# Patient Record
Sex: Female | Born: 1970 | Race: Black or African American | Hispanic: No | Smoking: Never smoker
Health system: Southern US, Community
[De-identification: ages and names within clinical notes are randomized; demographics above are authoritative.]

## PROBLEM LIST (undated history)

## (undated) DIAGNOSIS — T451X5A Adverse effect of antineoplastic and immunosuppressive drugs, initial encounter: Secondary | ICD-10-CM

## (undated) DIAGNOSIS — I427 Cardiomyopathy due to drug and external agent: Secondary | ICD-10-CM

## (undated) DIAGNOSIS — I10 Essential (primary) hypertension: Secondary | ICD-10-CM

## (undated) DIAGNOSIS — O020 Blighted ovum and nonhydatidiform mole: Secondary | ICD-10-CM

## (undated) DIAGNOSIS — O019 Hydatidiform mole, unspecified: Secondary | ICD-10-CM

## (undated) DIAGNOSIS — C801 Malignant (primary) neoplasm, unspecified: Secondary | ICD-10-CM

## (undated) HISTORY — DX: Cardiomyopathy due to drug and external agent: T45.1X5A

## (undated) HISTORY — DX: Morbid (severe) obesity due to excess calories: E66.01

## (undated) HISTORY — PX: DILATION AND CURETTAGE OF UTERUS: SHX78

## (undated) HISTORY — DX: Blighted ovum and nonhydatidiform mole: O02.0

## (undated) HISTORY — DX: Malignant (primary) neoplasm, unspecified: C80.1

## (undated) HISTORY — DX: Cardiomyopathy due to drug and external agent: I42.7

## (undated) HISTORY — PX: OTHER SURGICAL HISTORY: SHX169

## (undated) HISTORY — DX: Hydatidiform mole, unspecified: O01.9

## (undated) HISTORY — DX: Essential (primary) hypertension: I10

---

## 2005-10-16 ENCOUNTER — Other Ambulatory Visit: Admission: RE | Admit: 2005-10-16 | Discharge: 2005-10-16 | Payer: Self-pay | Admitting: Family Medicine

## 2005-10-16 ENCOUNTER — Ambulatory Visit: Payer: Self-pay | Admitting: Family Medicine

## 2005-10-21 ENCOUNTER — Ambulatory Visit: Payer: Self-pay | Admitting: Family Medicine

## 2005-10-31 ENCOUNTER — Ambulatory Visit: Payer: Self-pay | Admitting: Family Medicine

## 2005-12-25 ENCOUNTER — Ambulatory Visit: Payer: Self-pay | Admitting: Internal Medicine

## 2006-10-13 ENCOUNTER — Other Ambulatory Visit: Admission: RE | Admit: 2006-10-13 | Discharge: 2006-10-13 | Payer: Self-pay | Admitting: Family Medicine

## 2006-10-13 ENCOUNTER — Ambulatory Visit: Payer: Self-pay | Admitting: Family Medicine

## 2006-10-13 ENCOUNTER — Encounter: Payer: Self-pay | Admitting: Family Medicine

## 2006-10-15 ENCOUNTER — Ambulatory Visit: Payer: Self-pay | Admitting: Family Medicine

## 2006-10-15 LAB — CONVERTED CEMR LAB
Albumin: 3.3 g/dL — ABNORMAL LOW (ref 3.5–5.2)
Basophils Absolute: 0 10*3/uL (ref 0.0–0.1)
CO2: 24 meq/L (ref 19–32)
Chloride: 104 meq/L (ref 96–112)
Chol/HDL Ratio, serum: 2.9
Creatinine, Ser: 0.9 mg/dL (ref 0.4–1.2)
Eosinophil percent: 1.6 % (ref 0.0–5.0)
GFR calc non Af Amer: 75 mL/min
Glucose, Bld: 95 mg/dL (ref 70–99)
HCT: 37.1 % (ref 36.0–46.0)
HDL: 47 mg/dL (ref >39.0)
LDL Cholesterol: 76 mg/dL (ref 0–99)
MCHC: 31.1 g/dL (ref 30.0–36.0)
MCV: 73 fL — ABNORMAL LOW (ref 78.0–100.0)
Monocytes Absolute: 0.6 10*3/uL (ref 0.2–0.7)
Monocytes Relative: 10.9 % (ref 3.0–11.0)
Neutro Abs: 3 10*3/uL (ref 1.4–7.7)
Platelets: 211 10*3/uL (ref 150–400)
RBC: 5.08 M/uL (ref 3.87–5.11)
Sodium: 135 meq/L (ref 135–145)
Triglyceride fasting, serum: 61 mg/dL (ref 0–149)
VLDL: 12 mg/dL (ref 0–40)

## 2006-12-17 ENCOUNTER — Ambulatory Visit: Payer: Self-pay | Admitting: Family Medicine

## 2006-12-22 ENCOUNTER — Encounter: Admission: RE | Admit: 2006-12-22 | Discharge: 2007-03-22 | Payer: Self-pay | Admitting: Family Medicine

## 2007-01-05 ENCOUNTER — Ambulatory Visit: Payer: Self-pay | Admitting: Family Medicine

## 2007-11-10 ENCOUNTER — Ambulatory Visit: Payer: Self-pay | Admitting: Family Medicine

## 2007-11-10 DIAGNOSIS — N760 Acute vaginitis: Secondary | ICD-10-CM | POA: Insufficient documentation

## 2007-11-11 ENCOUNTER — Encounter (INDEPENDENT_AMBULATORY_CARE_PROVIDER_SITE_OTHER): Payer: Self-pay | Admitting: Family Medicine

## 2007-11-12 LAB — CONVERTED CEMR LAB: GC Probe Amp, Genital: NEGATIVE

## 2007-11-15 ENCOUNTER — Encounter (INDEPENDENT_AMBULATORY_CARE_PROVIDER_SITE_OTHER): Payer: Self-pay | Admitting: *Deleted

## 2008-02-25 ENCOUNTER — Encounter (INDEPENDENT_AMBULATORY_CARE_PROVIDER_SITE_OTHER): Payer: Self-pay | Admitting: *Deleted

## 2008-03-29 ENCOUNTER — Encounter: Payer: Self-pay | Admitting: Family Medicine

## 2008-03-29 ENCOUNTER — Other Ambulatory Visit: Admission: RE | Admit: 2008-03-29 | Discharge: 2008-03-29 | Payer: Self-pay | Admitting: Family Medicine

## 2008-03-29 ENCOUNTER — Ambulatory Visit: Payer: Self-pay | Admitting: Family Medicine

## 2008-03-29 DIAGNOSIS — R030 Elevated blood-pressure reading, without diagnosis of hypertension: Secondary | ICD-10-CM | POA: Insufficient documentation

## 2008-04-05 ENCOUNTER — Encounter (INDEPENDENT_AMBULATORY_CARE_PROVIDER_SITE_OTHER): Payer: Self-pay | Admitting: *Deleted

## 2008-04-05 ENCOUNTER — Ambulatory Visit: Payer: Self-pay | Admitting: Family Medicine

## 2008-04-06 ENCOUNTER — Encounter (INDEPENDENT_AMBULATORY_CARE_PROVIDER_SITE_OTHER): Payer: Self-pay | Admitting: *Deleted

## 2008-04-06 LAB — CONVERTED CEMR LAB
AST: 19 units/L (ref 0–37)
Alkaline Phosphatase: 30 units/L — ABNORMAL LOW (ref 39–117)
Basophils Absolute: 0 10*3/uL (ref 0.0–0.1)
Chloride: 106 meq/L (ref 96–112)
Eosinophils Absolute: 0.2 10*3/uL (ref 0.0–0.7)
GFR calc non Af Amer: 85 mL/min
HDL: 49.5 mg/dL (ref >39.0)
MCHC: 32.2 g/dL (ref 30.0–36.0)
MCV: 72.2 fL — ABNORMAL LOW (ref 78.0–100.0)
Neutrophils Relative %: 51.7 % (ref 43.0–77.0)
Platelets: 196 10*3/uL (ref 150–400)
Potassium: 3.5 meq/L (ref 3.5–5.1)
Total Bilirubin: 0.4 mg/dL (ref 0.3–1.2)
VLDL: 7 mg/dL (ref 0–40)
WBC: 6.7 10*3/uL (ref 4.5–10.5)

## 2008-04-12 ENCOUNTER — Ambulatory Visit: Payer: Self-pay | Admitting: Family Medicine

## 2008-04-12 DIAGNOSIS — I10 Essential (primary) hypertension: Secondary | ICD-10-CM

## 2008-04-12 DIAGNOSIS — B354 Tinea corporis: Secondary | ICD-10-CM | POA: Insufficient documentation

## 2008-04-13 ENCOUNTER — Telehealth (INDEPENDENT_AMBULATORY_CARE_PROVIDER_SITE_OTHER): Payer: Self-pay | Admitting: *Deleted

## 2008-05-11 ENCOUNTER — Ambulatory Visit: Payer: Self-pay | Admitting: Family Medicine

## 2008-05-11 DIAGNOSIS — D6489 Other specified anemias: Secondary | ICD-10-CM

## 2008-05-12 LAB — CONVERTED CEMR LAB
Basophils Absolute: 0 10*3/uL (ref 0.0–0.1)
Calcium: 9.1 mg/dL (ref 8.4–10.5)
Creatinine, Ser: 0.9 mg/dL (ref 0.4–1.2)
GFR calc Af Amer: 90 mL/min
Glucose, Bld: 89 mg/dL (ref 70–99)
HCT: 37.6 % (ref 36.0–46.0)
Hemoglobin: 11.9 g/dL — ABNORMAL LOW (ref 12.0–15.0)
MCHC: 31.8 g/dL (ref 30.0–36.0)
Monocytes Absolute: 0.4 10*3/uL (ref 0.1–1.0)
Monocytes Relative: 8.8 % (ref 3.0–12.0)
Neutro Abs: 2.5 10*3/uL (ref 1.4–7.7)
RDW: 14.2 % (ref 11.5–14.6)
Sodium: 135 meq/L (ref 135–145)

## 2008-05-15 ENCOUNTER — Encounter (INDEPENDENT_AMBULATORY_CARE_PROVIDER_SITE_OTHER): Payer: Self-pay | Admitting: *Deleted

## 2008-06-06 ENCOUNTER — Telehealth (INDEPENDENT_AMBULATORY_CARE_PROVIDER_SITE_OTHER): Payer: Self-pay | Admitting: *Deleted

## 2008-08-07 ENCOUNTER — Ambulatory Visit: Payer: Self-pay | Admitting: Family Medicine

## 2009-01-01 ENCOUNTER — Ambulatory Visit: Payer: Self-pay | Admitting: Family Medicine

## 2009-01-02 ENCOUNTER — Telehealth (INDEPENDENT_AMBULATORY_CARE_PROVIDER_SITE_OTHER): Payer: Self-pay | Admitting: *Deleted

## 2009-01-03 ENCOUNTER — Telehealth (INDEPENDENT_AMBULATORY_CARE_PROVIDER_SITE_OTHER): Payer: Self-pay | Admitting: *Deleted

## 2009-01-08 ENCOUNTER — Telehealth: Payer: Self-pay | Admitting: Family Medicine

## 2009-01-22 ENCOUNTER — Ambulatory Visit: Payer: Self-pay | Admitting: Family Medicine

## 2009-04-11 ENCOUNTER — Encounter: Payer: Self-pay | Admitting: Family Medicine

## 2009-04-11 ENCOUNTER — Other Ambulatory Visit: Admission: RE | Admit: 2009-04-11 | Discharge: 2009-04-11 | Payer: Self-pay | Admitting: Family Medicine

## 2009-04-11 ENCOUNTER — Ambulatory Visit: Payer: Self-pay | Admitting: Family Medicine

## 2009-04-11 LAB — CONVERTED CEMR LAB
ALT: 17 units/L (ref 0–35)
AST: 22 units/L (ref 0–37)
Basophils Relative: 0.2 % (ref 0.0–3.0)
Bilirubin Urine: NEGATIVE
Chloride: 98 meq/L (ref 96–112)
Cholesterol: 152 mg/dL (ref 0–200)
Eosinophils Relative: 1.8 % (ref 0.0–5.0)
GFR calc non Af Amer: 119.32 mL/min (ref >60)
HCT: 36.4 % (ref 36.0–46.0)
Hemoglobin: 11.7 g/dL — ABNORMAL LOW (ref 12.0–15.0)
LDL Cholesterol: 86 mg/dL (ref 0–99)
Lymphs Abs: 2.3 10*3/uL (ref 0.7–4.0)
MCV: 73.7 fL — ABNORMAL LOW (ref 78.0–100.0)
Monocytes Absolute: 0.5 10*3/uL (ref 0.1–1.0)
Monocytes Relative: 7.9 % (ref 3.0–12.0)
Potassium: 3.6 meq/L (ref 3.5–5.1)
RBC: 4.94 M/uL (ref 3.87–5.11)
Sodium: 139 meq/L (ref 135–145)
TSH: 2.44 microintl units/mL (ref 0.35–5.50)
Total CHOL/HDL Ratio: 3
Total Protein: 7.8 g/dL (ref 6.0–8.3)
Urobilinogen, UA: NEGATIVE
WBC Urine, dipstick: NEGATIVE
WBC: 5.9 10*3/uL (ref 4.5–10.5)

## 2009-04-12 ENCOUNTER — Encounter (INDEPENDENT_AMBULATORY_CARE_PROVIDER_SITE_OTHER): Payer: Self-pay | Admitting: *Deleted

## 2009-04-16 ENCOUNTER — Encounter (INDEPENDENT_AMBULATORY_CARE_PROVIDER_SITE_OTHER): Payer: Self-pay | Admitting: *Deleted

## 2009-12-04 ENCOUNTER — Encounter: Payer: Self-pay | Admitting: Family Medicine

## 2009-12-05 ENCOUNTER — Ambulatory Visit: Payer: Self-pay | Admitting: Family Medicine

## 2009-12-05 DIAGNOSIS — R040 Epistaxis: Secondary | ICD-10-CM

## 2010-01-22 ENCOUNTER — Encounter: Payer: Self-pay | Admitting: Family Medicine

## 2010-01-22 ENCOUNTER — Encounter: Admission: RE | Admit: 2010-01-22 | Discharge: 2010-01-22 | Payer: Self-pay | Admitting: Family Medicine

## 2010-04-12 ENCOUNTER — Ambulatory Visit: Payer: Self-pay | Admitting: Family Medicine

## 2010-04-12 ENCOUNTER — Other Ambulatory Visit: Admission: RE | Admit: 2010-04-12 | Discharge: 2010-04-12 | Payer: Self-pay | Admitting: Family Medicine

## 2010-04-12 DIAGNOSIS — M722 Plantar fascial fibromatosis: Secondary | ICD-10-CM

## 2010-04-12 LAB — HM PAP SMEAR: HM Pap smear: NEGATIVE

## 2010-04-12 LAB — CONVERTED CEMR LAB
Glucose, Urine, Semiquant: NEGATIVE
Nitrite: NEGATIVE
Protein, U semiquant: NEGATIVE
Specific Gravity, Urine: 1.025
WBC Urine, dipstick: NEGATIVE
pH: 5

## 2010-04-15 LAB — CONVERTED CEMR LAB
ALT: 20 units/L (ref 0–35)
AST: 20 units/L (ref 0–37)
Albumin: 3.7 g/dL (ref 3.5–5.2)
Alkaline Phosphatase: 42 units/L (ref 39–117)
Basophils Relative: 0.4 % (ref 0.0–3.0)
Calcium: 9.1 mg/dL (ref 8.4–10.5)
Eosinophils Relative: 1.4 % (ref 0.0–5.0)
Ferritin: 50.5 ng/mL (ref 10.0–291.0)
Folate: 11.9 ng/mL
GFR calc non Af Amer: 103.25 mL/min (ref >60)
Glucose, Bld: 95 mg/dL (ref 70–99)
HCT: 37.2 % (ref 36.0–46.0)
HDL: 55.3 mg/dL (ref >39.00)
Hemoglobin: 12.1 g/dL (ref 12.0–15.0)
Lymphocytes Relative: 39.1 % (ref 12.0–46.0)
Lymphs Abs: 2.6 10*3/uL (ref 0.7–4.0)
Monocytes Relative: 6.7 % (ref 3.0–12.0)
Neutro Abs: 3.5 10*3/uL (ref 1.4–7.7)
Potassium: 4.2 meq/L (ref 3.5–5.1)
RBC: 5.08 M/uL (ref 3.87–5.11)
RDW: 15.4 % — ABNORMAL HIGH (ref 11.5–14.6)
Saturation Ratios: 21.9 % (ref 20.0–50.0)
Sodium: 139 meq/L (ref 135–145)
TSH: 2.62 microintl units/mL (ref 0.35–5.50)
Total CHOL/HDL Ratio: 3
Total Protein: 7.6 g/dL (ref 6.0–8.3)
VLDL: 11.8 mg/dL (ref 0.0–40.0)

## 2010-04-16 ENCOUNTER — Encounter (INDEPENDENT_AMBULATORY_CARE_PROVIDER_SITE_OTHER): Payer: Self-pay | Admitting: *Deleted

## 2010-04-16 LAB — CONVERTED CEMR LAB: Pap Smear: NEGATIVE

## 2010-05-16 ENCOUNTER — Ambulatory Visit: Payer: Self-pay | Admitting: Family Medicine

## 2010-05-16 DIAGNOSIS — J029 Acute pharyngitis, unspecified: Secondary | ICD-10-CM | POA: Insufficient documentation

## 2010-05-16 DIAGNOSIS — J019 Acute sinusitis, unspecified: Secondary | ICD-10-CM

## 2010-05-31 ENCOUNTER — Ambulatory Visit: Payer: Self-pay | Admitting: Family Medicine

## 2010-05-31 LAB — CONVERTED CEMR LAB: Rapid Strep: NEGATIVE

## 2010-11-12 ENCOUNTER — Encounter: Admit: 2010-11-12 | Payer: Self-pay | Admitting: Family Medicine

## 2010-11-26 ENCOUNTER — Ambulatory Visit
Admission: RE | Admit: 2010-11-26 | Discharge: 2010-11-26 | Payer: Self-pay | Source: Home / Self Care | Attending: Family Medicine | Admitting: Family Medicine

## 2010-11-26 LAB — CONVERTED CEMR LAB: Beta hcg, urine, semiquantitative: POSITIVE

## 2010-11-26 NOTE — Assessment & Plan Note (Signed)
Summary: LARYNGITIS, SORE THROAT///SPH   Vital Signs:  Patient profile:   40 year old female Height:      71 inches Weight:      303 pounds O2 Sat:      98 % on Room air Temp:     98.8 degrees F oral Pulse rate:   96 / minute BP sitting:   120 / 90  (left arm)  Vitals Entered By: Jeremy Johann CMA (May 16, 2010 11:49 AM)  O2 Flow:  Room air CC: lose voice, sore throat, cough, muscle achesx1week, URI symptoms   History of Present Illness:       This is a 40 year old woman who presents with URI symptoms.  The symptoms began 1 week ago.  Pt here c/o nasal congestion, laryngitis and st.  The patient complains of nasal congestion, purulent nasal discharge, sore throat, dry cough, earache, and sick contacts.  The patient denies fever, low-grade fever (<100.5 degrees), fever of 100.5-103 degrees, fever of 103.1-104 degrees, fever to >104 degrees, stiff neck, dyspnea, wheezing, rash, vomiting, diarrhea, use of an antipyretic, and response to antipyretic.  The patient also reports sneezing.  The patient denies itchy watery eyes, itchy throat, seasonal symptoms, response to antihistamine, headache, muscle aches, and severe fatigue.  The patient denies the following risk factors for Strep sinusitis: unilateral facial pain, unilateral nasal discharge, poor response to decongestant, double sickening, tooth pain, Strep exposure, tender adenopathy, and absence of cough.    Current Medications (verified): 1)  Lisinopril 30 Mg Tabs (Lisinopril) .Marland Kitchen.. 1 By Mouth Once Daily 2)  Mobic 15 Mg Tabs (Meloxicam) .... 1/2-1  Tab By Mouth Once Daily  Allergies: 1)  * Sulfa (Sulfonamides) Group  Past History:  Past medical, surgical, family and social histories (including risk factors) reviewed for relevance to current acute and chronic problems.  Past Medical History: Reviewed history from 04/12/2008 and no changes required. HTN during pregnancy. Hypertension  Past Surgical History: Reviewed history  from 03/29/2008 and no changes required. Caesarean section X2  Family History: Reviewed history from 03/29/2008 and no changes required. Family History - M Maternal side family--- HTN,CVA PGM--DM2  Family History Diabetes 1st degree relative--M Family History Weight disorder--obesity  Social History: Reviewed history from 03/29/2008 and no changes required. Occupation:  Runner, broadcasting/film/video Married Never Smoked Alcohol use-yes Drug use-no Regular exercise-yes  [name]      See HPI  Physical Exam  General:  Well-developed,well-nourished,in no acute distress; alert,appropriate and cooperative throughout examination Ears:  External ear exam shows no significant lesions or deformities.  Otoscopic examination reveals clear canals, tympanic membranes are intact bilaterally without bulging, retraction, inflammation or discharge. Hearing is grossly normal bilaterally. Nose:  no external erythema.   Mouth:  pharyngeal erythema and postnasal drip.   Neck:  cervical lymphadenopathy.   Lungs:  Normal respiratory effort, chest expands symmetrically. Lungs are clear to auscultation, no crackles or wheezes. Heart:  Normal rate and regular rhythm. S1 and S2 normal without gallop, murmur, click, rub or other extra sounds. Extremities:  No clubbing, cyanosis, edema, or deformity noted with normal full range of motion of all joints.   Cervical Nodes:  No lymphadenopathy noted Psych:  Cognition and judgment appear intact. Alert and cooperative with normal attention span and concentration. No apparent delusions, illusions, hallucinations   Impression & Recommendations:  Problem # 1:  SINUSITIS - ACUTE-NOS (ICD-461.9)  Her updated medication list for this problem includes:    Augmentin 875-125 Mg Tabs (  Amoxicillin-pot clavulanate) .Marland Kitchen... 1 by mouth two times a day    Astepro 0.15 % Soln (Azelastine hcl) .Marland Kitchen... 2 sprays each nostril once daily    Veramyst 27.5 Mcg/spray Susp  (Fluticasone furoate) .Marland Kitchen... 2 sprays each nostril once daily    Mucinex Dm Maximum Strength 60-1200 Mg Xr12h-tab (Dextromethorphan-guaifenesin) .Marland Kitchen... 1 by mouth two times a day     Use otc mucinex for cough as needed   Instructed on treatment. Call if symptoms persist or worsen.   Complete Medication List: 1)  Lisinopril 30 Mg Tabs (Lisinopril) .Marland Kitchen.. 1 by mouth once daily 2)  Mobic 15 Mg Tabs (Meloxicam) .... 1/2-1  tab by mouth once daily 3)  Augmentin 875-125 Mg Tabs (Amoxicillin-pot clavulanate) .Marland Kitchen.. 1 by mouth two times a day 4)  Astepro 0.15 % Soln (Azelastine hcl) .... 2 sprays each nostril once daily 5)  Veramyst 27.5 Mcg/spray Susp (Fluticasone furoate) .... 2 sprays each nostril once daily 6)  Claritin 10 Mg Tabs (Loratadine) .Marland Kitchen.. 1 by mouth once daily as needed 7)  Mucinex Dm Maximum Strength 60-1200 Mg Xr12h-tab (Dextromethorphan-guaifenesin) .Marland Kitchen.. 1 by mouth two times a day  Other Orders: Rapid Strep ([address])  Patient Instructions: 1)  Acute sinusitis symptoms for less than 10 days are not helped by antibiotics. Use warm moist compresses, and over the counter decongestants( only as directed). Call if no improvement in 5-7 days, sooner if increasing pain, fever, or new symptoms.  2)  Use mucinex for cough as needed 3)  use otc antihistamine ( ex claritin or zyrtec) as needed  Prescriptions: MUCINEX DM MAXIMUM STRENGTH 60-1200 MG XR12H-TAB (DEXTROMETHORPHAN-GUAIFENESIN) 1 by mouth two times a day  #60 x 5   Entered and Authorized by:   Loreen Freud DO   Signed by:   Loreen Freud DO on 05/16/2010   Method used:   Electronically to        [id number]* (retail)       [address] [place], [place]  [address]       Ph: [phone]       Fax: [phone]   RxID:   [id number] CLARITIN 10 MG TABS (LORATADINE) 1 by mouth once daily as needed  #30 x 11   Entered and Authorized by:   Loreen Freud DO   Signed by:   Loreen Freud DO on 05/16/2010   Method  used:   Electronically to        [facility] #[id number]* (retail)       [address] [place], [place]  [address]       Ph: [phone]       Fax: [phone]   RxID:   [id number] VERAMYST 27.5 MCG/SPRAY SUSP (FLUTICASONE FUROATE) 2 sprays each nostril once daily  #1 x 0   Entered and Authorized by:   Loreen Freud DO   Signed by:   Loreen Freud DO on 05/16/2010   Method used:   Historical   RxID:   [id number] AUGMENTIN 875-125 MG TABS (AMOXICILLIN-POT CLAVULANATE) 1 by mouth two times a day  #20 x 0   Entered and Authorized by:   Loreen Freud DO   Signed by:   Loreen Freud DO on 05/16/2010   Method used:   Electronically to        [facility] [address] #[id number]* (retail)       [address]  [place], [place]  [address]       Ph: [phone]       Fax: [phone]   RxID:   [id number]   Laboratory Results    Other Tests  Rapid Strep: negative

## 2010-11-26 NOTE — Assessment & Plan Note (Signed)
Summary: cpx- jr   Vital Signs:  Patient profile:   40 year old female Height:      71 inches Weight:      302 pounds BMI:     42.27 Pulse rate:   78 / minute Pulse rhythm:   regular BP sitting:   126 / 80  (left arm) Cuff size:   large  Vitals Entered By: Army Fossa CMA (April 12, 2010 8:59 AM) CC: Pt here for CPX and pap   History of Present Illness: Pt here for cpe and lab.   Pt c/o b/l heel pain -- pain is worse in am and eases up as day goes on.  When she sits down and gets up again it starts hurting again. Pt is also struggling to lose weight.  She did great-- lost 17 lbs in 5 weeks but with the end of school and holidays, parties she gained it all back.  Pt will go back to nutritionist.    Preventive Screening-Counseling & Management  Alcohol-Tobacco     Alcohol drinks/day: <1     Alcohol type: mixed drinks, beer     Smoking Status: never  Caffeine-Diet-Exercise     Caffeine use/day: 0     Diet Comments: stopped eating out     Diet Counseling: to improve diet; diet is suboptimal     Nutrition Referrals: yes     Does Patient Exercise: no     Exercise Counseling: to improve exercise regimen  Hep-HIV-STD-Contraception     HIV Risk: no     Dental Visit-last 6 months no     Dental Care Counseling: 1x a year     SBE monthly: yes     SBE Education/Counseling: not indicated; SBE done regularly  Safety-Violence-Falls     Seat Belt Use: yes     Firearms in the Home: no firearms in the home     Smoke Detectors: yes     Violence in the Home: no risk noted     Sexual Abuse: no      Sexual History:  currently monogamous and married.        Drug Use:  never.    Current Medications (verified): 1)  Lisinopril 30 Mg Tabs (Lisinopril) .Marland Kitchen.. 1 By Mouth Once Daily 2)  Mobic 15 Mg Tabs (Meloxicam) .... 1/2-1  Tab By Mouth Once Daily  Allergies: 1)  * Sulfa (Sulfonamides) Group  Past History:  Past medical, surgical, family and social histories (including risk  factors) reviewed for relevance to current acute and chronic problems.  Past Medical History: Reviewed history from 04/12/2008 and no changes required. HTN during pregnancy. Hypertension  Past Surgical History: Reviewed history from 03/29/2008 and no changes required. Caesarean section X2  Family History: Reviewed history from 03/29/2008 and no changes required. Family History - M Maternal side family--- HTN,CVA PGM--DM2  Family History Diabetes 1st degree relative--M Family History Weight disorder--obesity  Social History: Reviewed history from 03/29/2008 and no changes required. Occupation:  Runner, broadcasting/film/video Married Never Smoked Alcohol use-yes Drug use-no Regular exercise-yes Does Patient Exercise:  no Sexual History:  currently monogamous, married  [name]      See HPI General:  Denies chills, fatigue, fever, loss of appetite, malaise, sleep disorder, sweats, weakness, and weight loss. Eyes:  Denies blurring, discharge, double vision, eye irritation, eye pain, halos, itching, light sensitivity, red eye, vision loss-1 eye, and vision loss-both eyes; ophtho-- q2y. ENT:  Denies decreased hearing, difficulty swallowing, ear discharge, earache, hoarseness,  nasal congestion, nosebleeds, postnasal drainage, ringing in ears, sinus pressure, and sore throat. CV:  Denies bluish discoloration of lips or nails, chest pain or discomfort, difficulty breathing at night, difficulty breathing while lying down, fainting, fatigue, leg cramps with exertion, lightheadness, near fainting, palpitations, shortness of breath with exertion, swelling of feet, swelling of hands, and weight gain. Resp:  Denies chest discomfort, chest pain with inspiration, cough, coughing up blood, excessive snoring, hypersomnolence, morning headaches, pleuritic, shortness of breath, sputum productive, and wheezing. GI:  Denies abdominal pain, bloody stools, change in bowel habits, constipation,  dark tarry stools, diarrhea, excessive appetite, gas, hemorrhoids, indigestion, loss of appetite, nausea, vomiting, vomiting blood, and yellowish skin color. GU:  Denies abnormal vaginal bleeding, decreased libido, discharge, dysuria, genital sores, hematuria, incontinence, nocturia, urinary frequency, and urinary hesitancy. MS:  Complains of joint pain; denies joint redness, joint swelling, loss of strength, low back pain, mid back pain, muscle aches, muscle , cramps, muscle weakness, stiffness, and thoracic pain; b/l feet. Derm:  Denies changes in color of skin, changes in nail beds, dryness, excessive perspiration, flushing, hair loss, insect bite(s), itching, lesion(s), poor wound healing, and rash. Neuro:  Denies brief paralysis, difficulty with concentration, disturbances in coordination, falling down, headaches, inability to speak, memory loss, numbness, poor balance, seizures, sensation of room spinning, tingling, tremors, visual disturbances, and weakness. Psych:  Denies alternate hallucination ( auditory/visual), anxiety, depression, easily angered, easily tearful, irritability, mental problems, panic attacks, sense of great danger, suicidal thoughts/plans, thoughts of violence, unusual visions or sounds, and thoughts /plans of harming others. Endo:  Complains of weight change; denies cold intolerance, excessive hunger, excessive thirst, excessive urination, heat intolerance, and polyuria. Heme:  Denies abnormal bruising, bleeding, enlarge lymph nodes, fevers, pallor, and skin discoloration. Allergy:  Denies hives or rash, itching eyes, persistent infections, seasonal allergies, and sneezing.  Physical Exam  General:  Well-developed,well-nourished,in no acute distress; alert,appropriate and cooperative throughout examination Head:  Normocephalic and atraumatic without obvious abnormalities. No apparent alopecia or balding. Eyes:  vision grossly intact, pupils equal, pupils round, pupils  reactive to light, and no injection.   Ears:  External ear exam shows no significant lesions or deformities.  Otoscopic examination reveals clear canals, tympanic membranes are intact bilaterally without bulging, retraction, inflammation or discharge. Hearing is grossly normal bilaterally. Nose:  External nasal examination shows no deformity or inflammation. Nasal mucosa are pink and moist without lesions or exudates. Mouth:  Oral mucosa and oropharynx without lesions or exudates.  Teeth in good repair. Neck:  No deformities, masses, or tenderness noted. Chest Wall:  No deformities, masses, or tenderness noted. Lungs:  Normal respiratory effort, chest expands symmetrically. Lungs are clear to auscultation, no crackles or wheezes. Heart:  normal rate and no murmur.   Abdomen:  Bowel sounds positive,abdomen soft and non-tender without masses, organomegaly or hernias noted. Rectal:  No external abnormalities noted. Normal sphincter tone. No rectal masses or tenderness. Genitalia:  Pelvic Exam:        External: normal female genitalia without lesions or masses        Vagina: normal without lesions or masses        Cervix: normal without lesions or masses        Adnexa: normal bimanual exam without masses or fullness        Uterus: normal by palpation        Pap smear: performed Msk:  normal ROM, no joint tenderness, no joint swelling, no joint warmth, no redness over joints, no  joint deformities, no joint instability, and no crepitation.   Pulses:  R posterior tibial normal, R dorsalis pedis normal, R carotid normal, L posterior tibial normal, L dorsalis pedis normal, and L carotid normal.   Extremities:  No clubbing, cyanosis, edema, or deformity noted with normal full range of motion of all joints.   Neurologic:  No cranial nerve deficits noted. Station and gait are normal. Plantar reflexes are down-going bilaterally. DTRs are symmetrical throughout. Sensory, motor and coordinative functions appear  intact. Skin:  Intact without suspicious lesions or rashes Cervical Nodes:  No lymphadenopathy noted Axillary Nodes:  No palpable lymphadenopathy Psych:  Cognition and judgment appear intact. Alert and cooperative with normal attention span and concentration. 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 1/2-1  tab by mouth once daily  Orders: EKG w/ Interpretation ([address])  Discussed use of gel inserts, ice massage, and stretching exercises.   Problem # 3:  HYPERTENSION (ICD-401.9)  Her updated medication list for this problem includes:    Lisinopril 30 Mg Tabs (Lisinopril) .Marland Kitchen... 1 by mouth once daily  Orders: Venipuncture ([address]) EKG w/ Interpretation ([address]) TLB-Lipid Panel (80061-LIPID) TLB-BMP (Basic Metabolic Panel-BMET) (80048-METABOL) TLB-CBC Platelet - w/Differential (85025-CBCD) TLB-Hepatic/Liver Function Pnl (80076-HEPATIC) TLB-TSH (Thyroid Stimulating Hormone) (84443-TSH) TLB-A1C / Hgb A1C (Glycohemoglobin) (83036-A1C)  BP today: 126/80 Prior BP: 124/80 (12/05/2009)  Labs Reviewed: K+: 3.6 (04/11/2009) Creat: : 0.7 (04/11/2009)   Chol: 152 (04/11/2009)   HDL: 57.00 (04/11/2009)   LDL: 86 (04/11/2009)   TG: 43.0 (04/11/2009)  Problem # 4:  MORBID OBESITY (ICD-278.01)  Orders: Venipuncture ([address]) EKG w/ Interpretation ([address]) TLB-Lipid Panel  (80061-LIPID) TLB-BMP (Basic Metabolic Panel-BMET) (80048-METABOL) TLB-CBC Platelet - w/Differential (85025-CBCD) TLB-Hepatic/Liver Function Pnl (80076-HEPATIC) TLB-TSH (Thyroid Stimulating Hormone) (84443-TSH) TLB-A1C / Hgb A1C (Glycohemoglobin) (83036-A1C)  Ht: 71 (04/12/2010)   Wt: 302 (04/12/2010)   BMI: 42.27 (04/12/2010)  Complete Medication List: 1)  Lisinopril 30 Mg Tabs (Lisinopril) .Marland Kitchen.. 1 by mouth once daily 2)  Mobic 15 Mg Tabs (Meloxicam) .... 1/2-1  tab by mouth once daily  Other Orders: TLB-B12 + Folate Pnl (29562_13086-V78/ION) TLB-IBC Pnl (Iron/FE;Transferrin) (83550-IBC) TLB-Ferritin (82728-FER) Prescriptions: MOBIC 15 MG TABS (MELOXICAM) 1/2-1  tab by mouth once daily  #30 x 2   Entered and Authorized by:   Loreen Freud DO   Signed by:   Loreen Freud DO on 04/12/2010   Method used:   Electronically to        [id number]* (retail)       [address] [place], [place]  [address]       Ph: [phone]       Fax: [phone]   RxID:   [id number] LISINOPRIL 30 MG TABS (LISINOPRIL) 1 by mouth once daily  #90 x 3   Entered and Authorized by:   Loreen Freud DO   Signed by:   Loreen Freud DO on 04/12/2010   Method used:   Electronically to        [facility] [phone]* (retail)       [address] [place], [place]  [address]       Ph: [phone]       Fax: [phone]   RxID:   [id number]      EKG  Procedure date:  04/12/2010  Findings:      Normal sinus rhythm with rate of:  68    Laboratory Results   Urine Tests   Date/Time Reported: April 12, 2010 10:28 AM   Routine Urinalysis   Color: yellow Appearance: Clear Glucose: negative   (Normal Range: Negative) Bilirubin: negative   (Normal Range: Negative) Ketone: negative   (Normal Range: Negative) Spec. Gravity: 1.025   (Normal Range: 1.003-1.035) Blood: negative   (Normal Range: Negative) pH: 5.0   (Normal Range: 5.0-8.0) Protein: negative    (Normal Range: Negative) Urobilinogen: negative   (Normal Range: 0-1) Nitrite: negative   (Normal Range: Negative) Leukocyte Esterace: negative   (Normal Range: Negative)    Comments: Floydene Flock  April 12, 2010 10:29 AM

## 2010-11-26 NOTE — Assessment & Plan Note (Signed)
Summary: CONGESTION/SORE THROAT//PH   Vital Signs:  Patient profile:   40 year old female Height:      71 inches Weight:      303 pounds BMI:     42.41 O2 Sat:      97 % on Room air Temp:     98.6 degrees F oral Pulse rate:   88 / minute BP sitting:   130 / 100  (left arm)  Vitals Entered By: Jeremy Johann CMA (May 31, 2010 11:33 AM)  O2 Flow:  Room air CC: sore throat, URI symptoms   History of Present Illness:       This is a 40 year old woman who presents with URI symptoms.  The symptoms began 12-24 hrs ago.  The patient complains of sore throat, but denies nasal congestion, clear nasal discharge, purulent nasal discharge, dry cough, productive cough, earache, and sick contacts.  The patient denies fever, low-grade fever (<100.5 degrees), fever of 100.5-103 degrees, fever of 103.1-104 degrees, fever to >104 degrees, stiff neck, dyspnea, wheezing, rash, vomiting, diarrhea, use of an antipyretic, and response to antipyretic.  The patient also reports itchy throat.  The patient denies itchy watery eyes, sneezing, seasonal symptoms, response to antihistamine, headache, muscle aches, and severe fatigue.  The patient denies the following risk factors for Strep sinusitis: unilateral facial pain, unilateral nasal discharge, poor response to decongestant, double sickening, tooth pain, Strep exposure, tender adenopathy, and absence of cough.    Current Medications (verified): 1)  Lisinopril 30 Mg Tabs (Lisinopril) .Marland Kitchen.. 1 By Mouth Once Daily  Allergies (verified): 1)  * Sulfa (Sulfonamides) Group  Past History:  Past medical, surgical, family and social histories (including risk factors) reviewed for relevance to current acute and chronic problems.  Past Medical History: Reviewed history from 04/12/2008 and no changes required. HTN during pregnancy. Hypertension  Past Surgical History: Reviewed history from 03/29/2008 and no changes required. Caesarean section X2  Family  History: Reviewed history from 03/29/2008 and no changes required. Family History - M Maternal side family--- HTN,CVA PGM--DM2  Family History Diabetes 1st degree relative--M Family History Weight disorder--obesity  Social History: Reviewed history from 03/29/2008 and no changes required. Occupation:  Runner, broadcasting/film/video Married Never Smoked Alcohol use-yes Drug use-no Regular exercise-yes  [name]      See HPI  Physical Exam  General:  Well-developed,well-nourished,in no acute distress; alert,appropriate and cooperative throughout examination Ears:  External ear exam shows no significant lesions or deformities.  Otoscopic examination reveals clear canals, tympanic membranes are intact bilaterally without bulging, retraction, inflammation or discharge. Hearing is grossly normal bilaterally. Nose:  External nasal examination shows no deformity or inflammation. Nasal mucosa are pink and moist without lesions or exudates. Mouth:  no exudates, pharyngeal erythema, and postnasal drip.   Neck:  No deformities, masses, or tenderness noted. Lungs:  Normal respiratory effort, chest expands symmetrically. Lungs are clear to auscultation, no crackles or wheezes. Heart:  normal rate and no murmur.   Psych:  Cognition and judgment appear intact. Alert and cooperative with normal attention span and concentration. No apparent delusions, illusions, hallucinations   Impression & Recommendations:  Problem # 1:  ACUTE PHARYNGITIS (ICD-462)  The following medications were removed from the medication list:    Mobic 15 Mg Tabs (Meloxicam) .Marland Kitchen... 1/2-1  tab by mouth once daily Her updated medication list for this problem includes:    Tylenol Extra Strength 500 Mg Tabs (Acetaminophen) .Marland Kitchen... 1-2 by mouth every 4-6 hours as  needed    Amoxicillin 875 Mg Tabs (Amoxicillin) .Marland Kitchen... 1 by mouth two times a day  Orders: T-Culture, Throat ([id number])  Instructed to complete  antibiotics and call if not improved in 48 hours.   Problem # 2:  HYPERTENSION (ICD-401.9)  recheck bp was the same  Her updated medication list for this problem includes:    Lisinopril 40 Mg Tabs (Lisinopril) .Marland Kitchen... 1 by mouth once daily  BP today: 130/100 Prior BP: 120/90 (05/16/2010)  Labs Reviewed: K+: 4.2 (04/12/2010) Creat: : 0.8 (04/12/2010)   Chol: 145 (04/12/2010)   HDL: 55.30 (04/12/2010)   LDL: 78 (04/12/2010)   TG: 59.0 (04/12/2010)  Complete Medication List: 1)  Lisinopril 40 Mg Tabs (Lisinopril) .Marland Kitchen.. 1 by mouth once daily 2)  Tylenol Extra Strength 500 Mg Tabs (Acetaminophen) .Marland Kitchen.. 1-2 by mouth every 4-6 hours as needed 3)  Claritin 10 Mg Tabs (Loratadine) .Marland Kitchen.. 1 by mouth once daily as needed 4)  Amoxicillin 875 Mg Tabs (Amoxicillin) .Marland Kitchen.. 1 by mouth two times a day  Patient Instructions: 1)  Please schedule a follow-up appointment in 2 weeks.  Prescriptions: AMOXICILLIN 875 MG TABS (AMOXICILLIN) 1 by mouth two times a day  #20 x 0   Entered and Authorized by:   Loreen Freud DO   Signed by:   Loreen Freud DO on 05/31/2010   Method used:   Print then Give to Patient   RxID:   [id number] LISINOPRIL 40 MG TABS (LISINOPRIL) 1 by mouth once daily  #30 x 2   Entered and Authorized by:   Loreen Freud DO   Signed by:   Loreen Freud DO on 05/31/2010   Method used:   Electronically to        [facility] #[id number]* (retail)       [address] [place], [place]  [address]       Ph: [phone]       Fax: [phone]   RxID:   [id number] CLARITIN 10 MG TABS (LORATADINE) 1 by mouth once daily as needed  #30 x 5   Entered and Authorized by:   Loreen Freud DO   Signed by:   Loreen Freud DO on 05/31/2010   Method used:   Electronically to        [facility] #[id number]* (retail)       [address] [place], [place]  [address]       Ph: [phone]       Fax: [phone]   RxID:   [id number] TYLENOL EXTRA STRENGTH 500 MG TABS  (ACETAMINOPHEN) 1-2 by mouth every 4-6 hours as needed  #1 bottle x 5   Entered and Authorized by:   Loreen Freud DO   Signed by:   Loreen Freud DO on 05/31/2010   Method used:   Electronically to        [facility] #[id number]* (retail)       [address] [place], [place]  [address]       Ph: [phone]       Fax: [phone]   RxID:   [id number]   Laboratory Results  Date/Time Received:  Date/Time Reported: May 31, 2010 11:47 AM  Other Tests  Rapid Strep: negative

## 2010-11-26 NOTE — Letter (Signed)
Summary: Results Follow up Letter  [facility]  [address] [place], [place] [address]   Phone: [phone]  Fax: [phone]    04/16/2010 MRN: [id number]  [facility] [address] [place], [place]  [address]  Dear Ms. Rost,  The following are the results of your recent test(s):  Test         Result    Pap Smear:        Normal __X___  Not Normal _____ Comments: ______________________________________________________ Cholesterol: LDL(Bad cholesterol):         Your goal is less than:         HDL (Good cholesterol):       Your goal is more than: Comments:  ______________________________________________________ Mammogram:        Normal _____  Not Normal _____ Comments:  ___________________________________________________________________ Hemoccult:        Normal _____  Not normal _______ Comments:    _____________________________________________________________________ Other Tests:    We routinely do not discuss normal results over the telephone.  If you desire a copy of the results, or you have any questions about this information we can discuss them at your next office visit.   Sincerely,    Army Fossa CMA  April 16, 2010 1:12 PM

## 2010-11-26 NOTE — Consult Note (Signed)
Summary: [facility]  [facility]   Imported By: Lanelle Bal 01/29/2010 09:08:37  _____________________________________________________________________  External Attachment:    Type:   Image     Comment:   External Document

## 2010-11-26 NOTE — Letter (Signed)
Summary: Call a Nurse  Call a Nurse   Imported By: Lanelle Bal 06/10/2010 14:16:48  _____________________________________________________________________  External Attachment:    Type:   Image     Comment:   External Document

## 2010-11-26 NOTE — Assessment & Plan Note (Signed)
Summary: NOSE BLEEDS/RH.....   Vital Signs:  Patient profile:   40 year old female Weight:      300 pounds Temp:     98.1 degrees F oral Pulse rate:   76 / minute Pulse rhythm:   regular BP sitting:   124 / 80  (left arm) Cuff size:   large  Vitals Entered By: Army Fossa CMA (December 05, 2009 11:24 AM) CC: Pt c/o yesterday had 3 nose bleeds. Longest one lasted 20-25 mins.    History of Present Illness: Pt here c/o nosebleeds x3 yesterday.  1 st 2 lasted and the last one lasted 20 min.  They did stop with ice and pressure.   Pt called on call nurse who told her to come in today.   No Headache, cp sob etc.    Current Medications (verified): 1)  Lisinopril 30 Mg Tabs (Lisinopril) .Marland Kitchen.. 1 By Mouth Once Daily  Allergies: 1)  * Sulfa (Sulfonamides) Group  Physical Exam  General:  Well-developed,well-nourished,in no acute distress; alert,appropriate and cooperative throughout examination Nose:  mucosal erythema.  no signs of active bleeding. Mouth:  Oral mucosa and oropharynx without lesions or exudates.  Teeth in good repair. Psych:  Oriented X3 and normally interactive.     Impression & Recommendations:  Problem # 1:  EPISTAXIS (ICD-784.7) Assessment New resolved for now saline spray in day, vaseline at night ENT if no better  Complete Medication List: 1)  Lisinopril 30 Mg Tabs (Lisinopril) .Marland Kitchen.. 1 by mouth once daily  Appended Document: Orders Update    Clinical Lists Changes  Medications: Rx of LISINOPRIL 30 MG TABS (LISINOPRIL) 1 by mouth once daily;  #90 x 3;  Signed;  Entered by: Loreen Freud DO;  Authorized by: Loreen Freud DO;  Method used: Electronically to [facility] #[id number]*, [address] [place], [place]  [address], Ph: [phone], Fax: [phone]    Prescriptions: LISINOPRIL 30 MG TABS (LISINOPRIL) 1 by mouth once daily  #90 x 3   Entered and Authorized by:   Loreen Freud DO   Signed by:   Loreen Freud DO on 12/05/2009  Method used:   Electronically to        [facility] #[id number]* (retail)       [address] 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

## 2010-11-26 NOTE — Letter (Signed)
Summary: Call a Nurse  Call a Nurse   Imported By: Lanelle Bal 12/10/2009 12:16:29  _____________________________________________________________________  External Attachment:    Type:   Image     Comment:   External Document

## 2010-11-28 ENCOUNTER — Telehealth: Payer: Self-pay | Admitting: Family Medicine

## 2010-12-04 NOTE — Assessment & Plan Note (Signed)
Summary: PREGNANCY TEST/RH........Marland Kitchen   Vital Signs:  Patient profile:   40 year old female Weight:      309.4 pounds Pulse rate:   88 / minute Pulse rhythm:   regular BP sitting:   130 / 86  (right arm) Cuff size:   large  Vitals Entered By: Almeta Monas CMA Duncan Dull) (November 26, 2010 3:53 PM) CC: lmp 10/07/10----needs Pregnancy test   History of Present Illness: Pt is here requesting a pregnancy test.  LMP DEC but she is not positive about the date.  Pt with hx gestational DM and Htn.  Current Medications (verified): 1)  Lisinopril 40 Mg Tabs (Lisinopril) .Marland Kitchen.. 1 By Mouth Once Daily 2)  Tylenol Extra Strength 500 Mg Tabs (Acetaminophen) .Marland Kitchen.. 1-2 By Mouth Every 4-6 Hours As Needed 3)  Claritin 10 Mg Tabs (Loratadine) .Marland Kitchen.. 1 By Mouth Once Daily As Needed 4)  Prenatal Vitamins 0.8 Mg Tabs (Prenatal Multivit-Min-Fe-Fa) .Marland Kitchen.. 1 By Mouth Once Daily  Allergies (verified): 1)  * Sulfa (Sulfonamides) Group  Past History:  Past Medical History: Last updated: 04/12/2008 HTN during pregnancy. Hypertension  Past Surgical History: Last updated: 03/29/2008 Caesarean section X2  Family History: Last updated: 03/29/2008 Family History - M Maternal side family--- HTN,CVA PGM--DM2  Family History Diabetes 1st degree relative--M Family History Weight disorder--obesity  Social History: Last updated: 03/29/2008 Occupation:  [place] [employer] Married Never Smoked Alcohol use-yes Drug use-no Regular exercise-yes  Risk Factors: Alcohol Use: <1 (04/12/2010) Caffeine Use: 0 (04/12/2010) Diet: stopped eating out (04/12/2010) Exercise: no (04/12/2010)  Risk Factors: Smoking Status: never (04/12/2010)  Family History: Reviewed history from 03/29/2008 and no changes required. Family History - M Maternal side family--- HTN,CVA PGM--DM2  Family History Diabetes 1st degree relative--M Family History Weight disorder--obesity  Social History: Reviewed history from  03/29/2008 and no changes required. 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 Lungs are clear to auscultation, no crackles or wheezes. Abdomen:  Bowel sounds positive,abdomen soft and non-tender without masses, organomegaly or hernias noted. Psych:  Cognition and judgment appear intact. Alert and cooperative with normal attention span and concentration. No apparent delusions, illusions, hallucinations   Impression & Recommendations:  Problem # 1:  PREGNANT STATE, INCIDENTAL (ICD-V22.2)  EDC 07/14/2011 EGA  [redacted] wks 1 day  Orders: Obstetric Referral (Obstetric)  Complete Medication List: 1)  Lisinopril 40 Mg Tabs (Lisinopril) .Marland Kitchen.. 1 by mouth once daily 2)  Tylenol Extra Strength 500 Mg Tabs (Acetaminophen) .Marland Kitchen.. 1-2 by mouth every 4-6 hours as needed 3)  Claritin 10 Mg Tabs (Loratadine) .Marland Kitchen.. 1 by mouth once daily as needed 4)  Prenatal Vitamins 0.8 Mg Tabs (Prenatal multivit-min-fe-fa) .Marland Kitchen.. 1 by mouth once daily   Orders Added: 1)  Obstetric Referral [Obstetric] 2)  Est. Patient Level III [99213]    Laboratory Results   Urine Tests      Urine HCG: positive

## 2010-12-04 NOTE — Progress Notes (Signed)
Summary: Rx prenatal vitamin  Phone Note Refill Request Call back at Home Phone [phone]   Refills Requested: Medication #1:  prenatal vitamin Pt states that she recently found out that she is pregnant and need Rx for med in order to be reimburse by insurance fo med..Pt would like to have Rx mail to her.Pls advise....Marland KitchenMarland KitchenFelecia Deloach CMA  November 28, 2010 10:36 AM    Follow-up for Phone Call        printed Follow-up by: Loreen Freud DO,  November 28, 2010 11:33 AM  Additional Follow-up for Phone Call Additional follow up Details #1::        mailed to home address Additional Follow-up by: Almeta Monas CMA Duncan Dull),  November 28, 2010 12:08 PM    Prescriptions: PRENATAL VITAMINS 0.8 MG TABS (PRENATAL MULTIVIT-MIN-FE-FA) 1 by mouth once daily  #30 x 11   Entered and Authorized by:   Loreen Freud DO   Signed by:   Loreen Freud DO on 11/28/2010   Method used:   Print then Give to Patient   RxID:   [id number]

## 2010-12-09 ENCOUNTER — Telehealth (INDEPENDENT_AMBULATORY_CARE_PROVIDER_SITE_OTHER): Payer: Self-pay | Admitting: *Deleted

## 2010-12-18 NOTE — Progress Notes (Signed)
Summary: stop BP med concern-  Phone Note Refill Request Call back at Home Phone [phone]   Refills Requested: Medication #1:  LISINOPRIL 40 MG TABS 1 by mouth once daily Pt left VM that she is very upset that she had to find out from pharmacy that she does not need to take this med while pregnant. Pt notes that she has been taking med up until this weekend when she went in for refill. Pt is requesting a call back to discuss matter. Review OV note and it was not indicated that Pt was to stop med however med was denied with note Pt needs to F/U with OB_GYN Pls advise..........Marland KitchenFelecia Deloach CMA  December 09, 2010 10:27 AM    Follow-up for Phone Call        change to methyldopa 250mg  1 by mouth two times a day  #60 ----ov 2 weeks to check bp when is appointment with gyn? Follow-up by: Loreen Freud DO,  December 09, 2010 11:09 AM  Additional Follow-up for Phone Call Additional follow up Details #1::        Left message to call back... Almeta Monas CMA Duncan Dull)  December 09, 2010 3:01 PM Left message to call office........Marland KitchenFelecia Deloach CMA  December 10, 2010 3:18 PM   Discuss with patient, Rx sent to pharmacy, appt scheduled...Marland KitchenMarland KitchenFelecia Deloach CMA  December 11, 2010 9:20 AM   Pt does not have GYN appt set up as of yet.Felecia Deloach CMA  December 11, 2010 9:21 AM.     New/Updated Medications: METHYLDOPA 250 MG TABS (METHYLDOPA) Take 1 by mouth two times a day Prescriptions: METHYLDOPA 250 MG TABS (METHYLDOPA) Take 1 by mouth two times a day  #60 x 0   Entered by:   Jeremy Johann CMA   Authorized by:   Loreen Freud DO   Signed by:   Jeremy Johann CMA on 12/11/2010   Method used:   Faxed to ...       [facility] [address] #[id number]* (retail)       [address] [place], [place]  [address]       Ph: [phone]       Fax: [phone]   RxID:   [id number]

## 2010-12-25 ENCOUNTER — Encounter: Payer: Self-pay | Admitting: Family Medicine

## 2010-12-25 ENCOUNTER — Ambulatory Visit (INDEPENDENT_AMBULATORY_CARE_PROVIDER_SITE_OTHER): Payer: Self-pay | Admitting: Family Medicine

## 2010-12-25 DIAGNOSIS — I10 Essential (primary) hypertension: Secondary | ICD-10-CM

## 2011-01-02 NOTE — Assessment & Plan Note (Signed)
Summary: f/u BP med   Vital Signs:  Patient profile:   40 year old female Weight:      310.4 pounds Pulse rate:   76 / minute Pulse rhythm:   regular BP sitting:   120 / 76  (right arm) Cuff size:   large  Vitals Entered By: Almeta Monas CMA Duncan Dull) (December 25, 2010 8:11 AM) CC: BP f/u   History of Present Illness: Pt here f/u bp since being dx + pregnancy.  She does not have an appointment with OB yet.  She will call and make one.   No complaints.    Current Medications (verified): 1)  Methyldopa 250 Mg Tabs (Methyldopa) .... Take 1 By Mouth Two Times A Day 2)  Prenatal Vitamins 0.8 Mg Tabs (Prenatal Multivit-Min-Fe-Fa) .Marland Kitchen.. 1 By Mouth Once Daily  Allergies (verified): 1)  * Sulfa (Sulfonamides) Group  Past History:  Past medical, surgical, family and social histories (including risk factors) reviewed for relevance to current acute and chronic problems.  Past Medical History: Reviewed history from 04/12/2008 and no changes required. HTN during pregnancy. Hypertension  Past Surgical History: Reviewed history from 03/29/2008 and no changes required. Caesarean section X2  Family History: Reviewed history from 03/29/2008 and no changes required. Family History - M Maternal side family--- HTN,CVA PGM--DM2  Family History Diabetes 1st degree relative--M Family History Weight disorder--obesity  Social History: Reviewed history from 03/29/2008 and no changes required. 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 Lungs are clear to auscultation, no crackles or wheezes. Heart:  normal rate and no murmur.   Extremities:  No clubbing, cyanosis, edema, or deformity  noted with normal full range of motion of all joints.   Psych:  Cognition and judgment appear intact. Alert and cooperative with normal attention span and concentration. No apparent delusions, illusions, hallucinations   Impression & Recommendations:  Problem # 1:  HYPERTENSION (ICD-401.9) Pt will f/u OB Her updated medication list for this problem includes:    Methyldopa 250 Mg Tabs (Methyldopa) .Marland Kitchen... Take 1 by mouth two times a day  BP today: 120/76 Prior BP: 130/86 (11/26/2010)  Labs Reviewed: K+: 4.2 (04/12/2010) Creat: : 0.8 (04/12/2010)   Chol: 145 (04/12/2010)   HDL: 55.30 (04/12/2010)   LDL: 78 (04/12/2010)   TG: 59.0 (04/12/2010)  Complete Medication List: 1)  Methyldopa 250 Mg Tabs (Methyldopa) .... Take 1 by mouth two times a day 2)  Prenatal Vitamins 0.8 Mg Tabs (Prenatal multivit-min-fe-fa) .Marland Kitchen.. 1 by mouth once daily   Orders Added: 1)  Est. Patient Level III [[address]]

## 2011-02-21 ENCOUNTER — Other Ambulatory Visit: Payer: Self-pay | Admitting: Obstetrics and Gynecology

## 2011-02-21 ENCOUNTER — Inpatient Hospital Stay (HOSPITAL_COMMUNITY): Payer: BC Managed Care – PPO

## 2011-02-21 ENCOUNTER — Ambulatory Visit (HOSPITAL_COMMUNITY)
Admission: AD | Admit: 2011-02-21 | Discharge: 2011-02-21 | Disposition: A | Discharge status: Home / Self Care | Payer: BC Managed Care – PPO | Source: Ambulatory Visit | Attending: Obstetrics and Gynecology | Admitting: Obstetrics and Gynecology

## 2011-02-21 DIAGNOSIS — O021 Missed abortion: Secondary | ICD-10-CM | POA: Insufficient documentation

## 2011-02-21 DIAGNOSIS — O4690 Antepartum hemorrhage, unspecified, unspecified trimester: Secondary | ICD-10-CM

## 2011-02-21 DIAGNOSIS — O0289 Other abnormal products of conception: Secondary | ICD-10-CM | POA: Insufficient documentation

## 2011-02-21 LAB — RUBELLA SCREEN: Rubella: 84.4 IU/mL — ABNORMAL HIGH

## 2011-02-21 LAB — COMPREHENSIVE METABOLIC PANEL
ALT: 22 U/L (ref 0–35)
AST: 27 U/L (ref 0–37)
Alkaline Phosphatase: 29 U/L — ABNORMAL LOW (ref 39–117)
CO2: 23 mEq/L (ref 19–32)
Chloride: 103 mEq/L (ref 96–112)
GFR calc Af Amer: >60 mL/min (ref >60)
GFR calc non Af Amer: >60 mL/min (ref >60)
Potassium: 3.8 mEq/L (ref 3.5–5.1)
Sodium: 133 mEq/L — ABNORMAL LOW (ref 135–145)
Total Bilirubin: 0.4 mg/dL (ref 0.3–1.2)

## 2011-02-21 LAB — DIFFERENTIAL
Basophils Absolute: 0 10*3/uL (ref 0.0–0.1)
Basophils Relative: 0 % (ref 0–1)
Lymphocytes Relative: 33 % (ref 12–46)
Neutro Abs: 4.4 10*3/uL (ref 1.7–7.7)
Neutrophils Relative %: 57 % (ref 43–77)

## 2011-02-21 LAB — URINALYSIS, ROUTINE W REFLEX MICROSCOPIC
Bilirubin Urine: NEGATIVE
Nitrite: NEGATIVE
Specific Gravity, Urine: >1.03 — ABNORMAL HIGH (ref 1.005–1.030)
pH: 6 (ref 5.0–8.0)

## 2011-02-21 LAB — CBC
Hemoglobin: 11.1 g/dL — ABNORMAL LOW (ref 12.0–15.0)
RBC: 4.82 MIL/uL (ref 3.87–5.11)

## 2011-02-21 LAB — URINE MICROSCOPIC-ADD ON

## 2011-02-21 LAB — ABO/RH: ABO/RH(D): A POS

## 2011-02-22 LAB — GC/CHLAMYDIA PROBE AMP, URINE: Chlamydia, Swab/Urine, PCR: NEGATIVE

## 2011-02-25 LAB — HEMOGLOBINOPATHY EVALUATION
Hgb F Quant: 0 % (ref 0.0–2.0)
Hgb S Quant: 0 % (ref 0.0–0.0)

## 2011-02-25 DEATH — deceased

## 2011-03-06 NOTE — Op Note (Signed)
  NAMECRISTLE, Kathy               ACCOUNT NO.:  [id number]  MEDICAL RECORD NO.:  [id number]           PATIENT TYPE:  O  LOCATION:  [facility]                         [facility]AC[facility]  PHYSICIAN:  Holly Iannaccone A. Ymani Porcher, M.D. 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  PROCEDURE:  Dilatation and evacuation  SURGEON:  Nimrod Wendt A. Merelyn Klump, MD  ASSISTANTS:  There are no assistants.  ANESTHESIA:  MAC and local.  FINDINGS:  A large amount of products of conception in the uterus.  The uterus measured 12 to 14 weeks' size.  Products of conception were sent to Pathology.  ESTIMATED BLOOD LOSS:  200 mL.  COMPLICATIONS:  There were no complications.  The patient went to PACU in stable condition.  Before the procedure, the patient was noted to be A positive and her beta HCG was 292,220.  I told the patient that by ultrasound, she had a molar pregnancy with the risk for bleeding, infection, perforation of the uterus and Asherman syndrome and that molar pregnancies could go on to be malignant and that we would have to watch her very closely.  She understands the risk of procedure which she has and D and E and we proceeded.  The patient was taken to the operating room where she was given MAC anesthesia, placed in dorsal lithotomy position and prepped and draped in a normal sterile fashion.  An in-and-out catheter was used to drain the bladder.  A bivalve speculum was placed into the vagina. The anterior lip of the cervix was grasped with single-tooth tenaculum. The cervix was dilated Pratt dilators to size 31 and size 10 suction curettage placed into the uterine cavity and large amounts of products of conception and what appeared to be dark old blood was obtained.  We had to change the canister so much  that was obtained.  The curettage was removed.  A sharp curette was placed into the uterine cavity and all 4 walls were noted to be gritty.  The suction curettage was replaced and just bubbles and blood were noted.  Pitocin was given.  All instruments were removed from the vagina and cervix. Tenaculum site was made hemostatic with pressure and silver nitrate. Sponge, lap, and needle counts were correct.  The patient went to the recovery room in stable condition.     Donie Moulton A. Normand Sloop, M.D.     NAD/MEDQ  D:  02/21/2011  T:  03-18-2011  Job:  [id number]  Electronically Signed by Jaymes Graff M.D. on 03/06/2011 11:16:31 PM

## 2011-03-13 ENCOUNTER — Inpatient Hospital Stay (HOSPITAL_COMMUNITY): Payer: BC Managed Care – PPO

## 2011-03-13 ENCOUNTER — Inpatient Hospital Stay (HOSPITAL_COMMUNITY)
Admission: AD | Admit: 2011-03-13 | Discharge: 2011-03-13 | Disposition: A | Discharge status: Home / Self Care | Payer: BC Managed Care – PPO | Source: Ambulatory Visit | Attending: Obstetrics and Gynecology | Admitting: Obstetrics and Gynecology

## 2011-03-13 DIAGNOSIS — R05 Cough: Secondary | ICD-10-CM | POA: Insufficient documentation

## 2011-03-13 DIAGNOSIS — O0289 Other abnormal products of conception: Secondary | ICD-10-CM | POA: Insufficient documentation

## 2011-03-13 DIAGNOSIS — R059 Cough, unspecified: Secondary | ICD-10-CM | POA: Insufficient documentation

## 2011-03-13 LAB — COMPREHENSIVE METABOLIC PANEL
BUN: 8 mg/dL (ref 6–23)
Calcium: 9.2 mg/dL (ref 8.4–10.5)
Creatinine, Ser: 0.64 mg/dL (ref 0.4–1.2)
Glucose, Bld: 95 mg/dL (ref 70–99)
Sodium: 133 mEq/L — ABNORMAL LOW (ref 135–145)
Total Protein: 7.2 g/dL (ref 6.0–8.3)

## 2011-03-13 LAB — CBC
HCT: 36.1 % (ref 36.0–46.0)
Hemoglobin: 11.6 g/dL — ABNORMAL LOW (ref 12.0–15.0)
MCV: 72.1 fL — ABNORMAL LOW (ref 78.0–100.0)
RBC: 5.01 MIL/uL (ref 3.87–5.11)
WBC: 7.1 10*3/uL (ref 4.0–10.5)

## 2011-03-14 NOTE — Assessment & Plan Note (Signed)
[facility]                                 ON-CALL NOTE   NAME:[name], Kathy                        MRN:          [id number]  DATE:10/13/2006                            DOB:          [birth year]    TELEPHONE TRIAGE NOTE:   TIME RECEIVED:  8:45 p.m.   CALLER:  [name]   SHE SEES:  Loreen Freud, M.D.   The patient is at the pharmacy now to pick up prescriptions that were to  have been called in from the office today and apparently, the pharmacy  says they were never called in.  This is to refill her usual Ortho Tri-  Cyclen, as well as to begin a diuretic.  She does not know the name or  the dosage of the diuretic.  She is at [facility] on [address] at [phone].  In fact, I did speak to Darl Pikes at [facility] and did  okay a refill for one 28-day pack of Ortho Tri-Cyclen.  For further  refills, the patient is to contact Dr. Ernst Spell office tomorrow and also  ask them to clarify the other prescription for the diuretic.     Tera Mater. Clent Ridges, MD  Electronically Signed    SAF/MedQ  DD: 10/13/2006  DT: 10/14/2006  Job #: [id number]   cc:   Loreen Freud, M.D.

## 2011-03-18 ENCOUNTER — Other Ambulatory Visit (HOSPITAL_COMMUNITY): Payer: Self-pay | Admitting: Obstetrics and Gynecology

## 2011-03-18 DIAGNOSIS — IMO0002 Reserved for concepts with insufficient information to code with codable children: Secondary | ICD-10-CM

## 2011-03-20 ENCOUNTER — Other Ambulatory Visit (HOSPITAL_COMMUNITY): Payer: Self-pay | Admitting: Obstetrics and Gynecology

## 2011-03-20 ENCOUNTER — Inpatient Hospital Stay (HOSPITAL_COMMUNITY)
Admission: AD | Admit: 2011-03-20 | Discharge: 2011-03-20 | Disposition: A | Discharge status: Home / Self Care | Payer: BC Managed Care – PPO | Source: Ambulatory Visit | Attending: Obstetrics and Gynecology | Admitting: Obstetrics and Gynecology

## 2011-03-20 ENCOUNTER — Ambulatory Visit (HOSPITAL_COMMUNITY)
Admission: RE | Admit: 2011-03-20 | Discharge: 2011-03-20 | Disposition: A | Discharge status: Home / Self Care | Payer: BC Managed Care – PPO | Source: Ambulatory Visit | Attending: Obstetrics and Gynecology | Admitting: Obstetrics and Gynecology

## 2011-03-20 DIAGNOSIS — IMO0002 Reserved for concepts with insufficient information to code with codable children: Secondary | ICD-10-CM

## 2011-03-20 DIAGNOSIS — O0289 Other abnormal products of conception: Secondary | ICD-10-CM | POA: Insufficient documentation

## 2011-03-20 DIAGNOSIS — O019 Hydatidiform mole, unspecified: Secondary | ICD-10-CM | POA: Insufficient documentation

## 2011-03-20 DIAGNOSIS — O00109 Unspecified tubal pregnancy without intrauterine pregnancy: Secondary | ICD-10-CM | POA: Insufficient documentation

## 2011-03-20 LAB — CREATININE, SERUM
GFR calc Af Amer: >60 mL/min (ref >60)
GFR calc non Af Amer: >60 mL/min (ref >60)

## 2011-03-20 LAB — CBC
HCT: 36.1 % (ref 36.0–46.0)
MCHC: 31.9 g/dL (ref 30.0–36.0)
MCV: 72.5 fL — ABNORMAL LOW (ref 78.0–100.0)
RDW: 16.3 % — ABNORMAL HIGH (ref 11.5–15.5)

## 2011-03-20 LAB — HCG, QUANTITATIVE, PREGNANCY: hCG, Beta Chain, Quant, S: 229530 m[IU]/mL — ABNORMAL HIGH (ref <5)

## 2011-03-20 MED ORDER — IOHEXOL 300 MG/ML  SOLN: 100.0000 mL | Freq: Once | INTRAMUSCULAR | Status: DC | PRN

## 2011-03-23 ENCOUNTER — Inpatient Hospital Stay (HOSPITAL_COMMUNITY)
Admission: AD | Admit: 2011-03-23 | Discharge: 2011-03-23 | Disposition: A | Discharge status: Home / Self Care | Payer: BC Managed Care – PPO | Source: Ambulatory Visit | Attending: Obstetrics and Gynecology | Admitting: Obstetrics and Gynecology

## 2011-03-23 DIAGNOSIS — O0289 Other abnormal products of conception: Secondary | ICD-10-CM | POA: Insufficient documentation

## 2011-03-23 LAB — HCG, QUANTITATIVE, PREGNANCY: hCG, Beta Chain, Quant, S: 185413 m[IU]/mL — ABNORMAL HIGH (ref <5)

## 2011-03-25 ENCOUNTER — Other Ambulatory Visit: Payer: Self-pay | Admitting: Oncology

## 2011-03-25 ENCOUNTER — Encounter (HOSPITAL_BASED_OUTPATIENT_CLINIC_OR_DEPARTMENT_OTHER): Payer: BC Managed Care – PPO | Admitting: Oncology

## 2011-03-25 ENCOUNTER — Ambulatory Visit: Payer: BC Managed Care – PPO | Attending: Gynecologic Oncology | Admitting: Gynecologic Oncology

## 2011-03-25 DIAGNOSIS — D392 Neoplasm of uncertain behavior of placenta: Secondary | ICD-10-CM | POA: Insufficient documentation

## 2011-03-25 DIAGNOSIS — I10 Essential (primary) hypertension: Secondary | ICD-10-CM | POA: Insufficient documentation

## 2011-03-25 DIAGNOSIS — D649 Anemia, unspecified: Secondary | ICD-10-CM

## 2011-03-25 LAB — COMPREHENSIVE METABOLIC PANEL
ALT: 24 U/L (ref 0–35)
AST: 20 U/L (ref 0–37)
Albumin: 3 g/dL — ABNORMAL LOW (ref 3.5–5.2)
Alkaline Phosphatase: 28 U/L — ABNORMAL LOW (ref 39–117)
BUN: 6 mg/dL (ref 6–23)
Calcium: 8.7 mg/dL (ref 8.4–10.5)
Chloride: 102 mEq/L (ref 96–112)
Potassium: 3.5 mEq/L (ref 3.5–5.3)
Sodium: 135 mEq/L (ref 135–145)
Total Protein: 7.1 g/dL (ref 6.0–8.3)

## 2011-03-25 LAB — CBC WITH DIFFERENTIAL/PLATELET
Basophils Absolute: 0 10*3/uL (ref 0.0–0.1)
EOS%: 1.3 % (ref 0.0–7.0)
HGB: 10.8 g/dL — ABNORMAL LOW (ref 11.6–15.9)
MCH: 24.3 pg — ABNORMAL LOW (ref 25.1–34.0)
NEUT#: 4.2 10*3/uL (ref 1.5–6.5)
RBC: 4.45 10*6/uL (ref 3.70–5.45)
RDW: 16.6 % — ABNORMAL HIGH (ref 11.2–14.5)
lymph#: 2.2 10*3/uL (ref 0.9–3.3)

## 2011-03-26 ENCOUNTER — Inpatient Hospital Stay (HOSPITAL_COMMUNITY): Payer: BC Managed Care – PPO

## 2011-03-26 ENCOUNTER — Inpatient Hospital Stay (HOSPITAL_COMMUNITY)
Admission: AD | Admit: 2011-03-26 | Discharge: 2011-03-28 | DRG: 410 | Disposition: A | Discharge status: Home / Self Care | Payer: BC Managed Care – PPO | Source: Ambulatory Visit | Attending: Oncology | Admitting: Oncology

## 2011-03-26 DIAGNOSIS — C569 Malignant neoplasm of unspecified ovary: Secondary | ICD-10-CM | POA: Diagnosis present

## 2011-03-26 DIAGNOSIS — D392 Neoplasm of uncertain behavior of placenta: Secondary | ICD-10-CM | POA: Diagnosis present

## 2011-03-26 DIAGNOSIS — N898 Other specified noninflammatory disorders of vagina: Secondary | ICD-10-CM | POA: Diagnosis present

## 2011-03-26 DIAGNOSIS — D649 Anemia, unspecified: Secondary | ICD-10-CM | POA: Diagnosis present

## 2011-03-26 DIAGNOSIS — C7800 Secondary malignant neoplasm of unspecified lung: Secondary | ICD-10-CM | POA: Diagnosis present

## 2011-03-26 DIAGNOSIS — Z5111 Encounter for antineoplastic chemotherapy: Principal | ICD-10-CM

## 2011-03-26 DIAGNOSIS — I10 Essential (primary) hypertension: Secondary | ICD-10-CM | POA: Diagnosis present

## 2011-03-26 DIAGNOSIS — R112 Nausea with vomiting, unspecified: Secondary | ICD-10-CM | POA: Diagnosis present

## 2011-03-26 LAB — URINALYSIS, DIPSTICK ONLY
Bilirubin Urine: NEGATIVE
Bilirubin Urine: NEGATIVE
Glucose, UA: NEGATIVE mg/dL
Glucose, UA: NEGATIVE mg/dL
Hgb urine dipstick: NEGATIVE
Ketones, ur: NEGATIVE mg/dL
Ketones, ur: NEGATIVE mg/dL
Leukocytes, UA: NEGATIVE
Leukocytes, UA: NEGATIVE
Nitrite: NEGATIVE
Protein, ur: NEGATIVE mg/dL
Specific Gravity, Urine: 1.018 (ref 1.005–1.030)
Specific Gravity, Urine: 1.002 — ABNORMAL LOW (ref 1.005–1.030)
Urobilinogen, UA: 0.2 mg/dL (ref 0.0–1.0)
pH: 6.5 (ref 5.0–8.0)
pH: 7.5 (ref 5.0–8.0)
pH: 7.5 (ref 5.0–8.0)

## 2011-03-26 LAB — URINALYSIS, ROUTINE W REFLEX MICROSCOPIC
Bilirubin Urine: NEGATIVE
Ketones, ur: NEGATIVE mg/dL
Nitrite: NEGATIVE
Protein, ur: NEGATIVE mg/dL
Urobilinogen, UA: 0.2 mg/dL (ref 0.0–1.0)

## 2011-03-26 LAB — URINE MICROSCOPIC-ADD ON

## 2011-03-26 NOTE — Consult Note (Signed)
[name], [name] NO.:  [id number]  MEDICAL RECORD NO.:  [id number]           PATIENT TYPE:  O  LOCATION:  GYN                          FACILITY:  [facility]  PHYSICIAN:  Laurette Schimke, MD     DATE OF BIRTH:  Apr 24, 1971  DATE OF CONSULTATION:  03/25/2011 DATE OF DISCHARGE:                                CONSULTATION   REASON FOR CONSULTATION:  Consult was requested for evaluation of patient with gestational trophoblastic disease.  HISTORY OF PRESENT ILLNESS:  This is a 40 year old gravida 4, para 2 with 1 neonatal demise, last pregnancy 2-1/2 years ago that resulted in miscarriage.  Last normal menstrual period in December of 2011. Pregnancy was confirmed at the end of January 2012.  She presented with bleeding on February 21, 2011 and at that time, was evaluated and underwent D and C.  On February 21, 2011, at the time of the D and C, a beta HCG was 292,220.  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  [name] reports continuous nausea since January 2012. She denies headache.  She denies any abnormal bleeding prior to that in April or since.  She denies any abdominal pain, cough or hemoptysis. The beta HCG after D and C was 158,467.  Repeat check on Mar 07, 2011 was 119,147.  Followup 1 week later on May 17, she was noted to have a count of 257,443, 50 mg per meter squared of methotrexate was administered on Mar 13, 2011.  On Mar 20, 2011, beta HCG was 229,530 and 50 mg per meter squared of methotrexate was administered.  Chest x-ray was obtained and was within normal limits.  On May 27, beta HCG returned to 185,413.  CT of the chest, abdomen and pelvis is notable for several small bilateral pulmonary nodules, greater than 4 in the left lung and great than 3 in the right lung.  A CT of the abdomen and pelvis was notable for intrauterine mass, measuring 11 x 11 x 9 cm, which was heterogeneous in nature, compatible history of trophoblastic  disease. The liver, gallbladder, spleen and pancreas were within normal limits.  PAST MEDICAL HISTORY:  Hypertension, diagnosed in 2003.  PAST SURGICAL HISTORY:  Oral surgery 2008 and dilatation and curettage April 2012.  GYN HISTORY:  Regular menses.  Neonatal, denies.  She has one living child.  She believes that her parity is satisfied.  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  Sexually active, using condoms for birth control.  She denies tobacco and uses alcohol only socially.  FAMILY HISTORY:  Questionable cervical cancer in her mother.  ALLERGIES:  SULFA that causes hives.  [name]:  No headache.  No chest pain, shortness of breath, hemoptysis.  No vaginal, rectal bleeding from the bladder.  No abdominal pain.  No vaginal bleeding.  PHYSICAL EXAMINATION:  GENERAL:  Well-developed female in no acute distress. VITAL SIGNS:  Weight 305 pounds, height 5 feet 10 inches, blood pressure 130/74, pulse of 88. CHEST:  Clear to auscultation. HEART:  Regular rate and rhythm. ABDOMEN:  Soft, nontender.  No palpable  masses. BACK:  No CVA tenderness. LYMPH NODE SURVEY:  No cervical, supraclavicular or inguinal adenopathy. PELVIC:  Normal external genitalia, Bartholin's, urethra and Skene's. No vaginal bleeding.  No vaginal lesions.  No metastatic disease in the external genitalia.  Uterus is enlarged, globular on palpation.  No nodularity within the cul-de-sac.  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  WHO score is greater than 8, specifically one point for age greater than 39, 4.4 HCG at time of diagnosis, 2 points for greater than 4 metastatic lesions and previously failed single drug therapy 2 points.  The proposed treatment regimen for this patient would be that of [place].  Specifically on day 1, etoposide 100 mg per meter squared along with methotrexate 100 mg per meter squared IV bolus.  On day 2,  dactinomycin 350 mcg per meter squared with etoposide 100 mg meter squared.  Consideration for folinic acid on day 8,  cyclophosphamide 600 mg per meter squared IV and vincristine 1 mg per meter squared IV, cycles occur every 2 weeks.  [name] is aware that Dr. Darrold Span has provided professional courtesy of seeing her today. She is aware that Depo-Provera is strongly recommended during the course of this therapy and this current therapy will continue until she is 3 cycles past normal beta HCG.  All of her questions were answered in the presence of her mother.  She has appointment with Dr. Jama Flavors on Mar 25, 2011 at 2 p.m.  Dr. Darrold Span was very appreciative of your facilitating and scheduled to allow this.     Laurette Schimke, MD     WB/MEDQ  D:  03/25/2011  T:  03/25/2011  Job:  [id number]  cc:   Naima A. Normand Sloop, M.D. Fax: [phone]  Telford Nab, R.N. [address] [place], [place] [address]  Electronically Signed by Laurette Schimke MD on 03/26/2011 09:07:24 AM

## 2011-03-27 LAB — URINALYSIS, DIPSTICK ONLY
Bilirubin Urine: NEGATIVE
Bilirubin Urine: NEGATIVE
Bilirubin Urine: NEGATIVE
Bilirubin Urine: NEGATIVE
Glucose, UA: NEGATIVE mg/dL
Glucose, UA: NEGATIVE mg/dL
Glucose, UA: >1000 mg/dL — AB
Glucose, UA: 250 mg/dL — AB
Hgb urine dipstick: NEGATIVE
Ketones, ur: NEGATIVE mg/dL
Ketones, ur: NEGATIVE mg/dL
Leukocytes, UA: NEGATIVE
Protein, ur: NEGATIVE mg/dL
Protein, ur: NEGATIVE mg/dL
Specific Gravity, Urine: 1.006 (ref 1.005–1.030)
pH: 8 (ref 5.0–8.0)
pH: 7 (ref 5.0–8.0)

## 2011-03-27 LAB — BASIC METABOLIC PANEL
CO2: 24 mEq/L (ref 19–32)
Chloride: 103 mEq/L (ref 96–112)
GFR calc Af Amer: >60 mL/min (ref >60)
Glucose, Bld: 163 mg/dL — ABNORMAL HIGH (ref 70–99)
Sodium: 135 mEq/L (ref 135–145)

## 2011-03-28 DIAGNOSIS — D392 Neoplasm of uncertain behavior of placenta: Secondary | ICD-10-CM

## 2011-03-28 LAB — BASIC METABOLIC PANEL
BUN: 8 mg/dL (ref 6–23)
CO2: 25 mEq/L (ref 19–32)
Calcium: 8.5 mg/dL (ref 8.4–10.5)
Chloride: 101 mEq/L (ref 96–112)
Creatinine, Ser: 0.76 mg/dL (ref 0.4–1.2)
GFR calc Af Amer: >60 mL/min (ref >60)
GFR calc non Af Amer: >60 mL/min (ref >60)
Glucose, Bld: 162 mg/dL — ABNORMAL HIGH (ref 70–99)
Potassium: 3.7 mEq/L (ref 3.5–5.1)
Sodium: 136 mEq/L (ref 135–145)

## 2011-03-28 LAB — URINALYSIS, DIPSTICK ONLY
Bilirubin Urine: NEGATIVE
Bilirubin Urine: NEGATIVE
Glucose, UA: NEGATIVE mg/dL
Glucose, UA: NEGATIVE mg/dL
Ketones, ur: NEGATIVE mg/dL
Ketones, ur: NEGATIVE mg/dL
Leukocytes, UA: NEGATIVE
Leukocytes, UA: NEGATIVE
Nitrite: NEGATIVE
Nitrite: NEGATIVE
Protein, ur: NEGATIVE mg/dL
Protein, ur: NEGATIVE mg/dL
Specific Gravity, Urine: 1.006 (ref 1.005–1.030)
Specific Gravity, Urine: 1.003 — ABNORMAL LOW (ref 1.005–1.030)
Urobilinogen, UA: 0.2 mg/dL (ref 0.0–1.0)
Urobilinogen, UA: 0.2 mg/dL (ref 0.0–1.0)
pH: 7.5 (ref 5.0–8.0)
pH: 8 (ref 5.0–8.0)

## 2011-03-28 LAB — FERRITIN: Ferritin: 136 ng/mL (ref 10–291)

## 2011-03-28 LAB — IRON AND TIBC: UIBC: <55 ug/dL

## 2011-03-31 ENCOUNTER — Encounter (HOSPITAL_BASED_OUTPATIENT_CLINIC_OR_DEPARTMENT_OTHER): Payer: BC Managed Care – PPO | Admitting: Oncology

## 2011-03-31 DIAGNOSIS — Z452 Encounter for adjustment and management of vascular access device: Secondary | ICD-10-CM

## 2011-03-31 DIAGNOSIS — D392 Neoplasm of uncertain behavior of placenta: Secondary | ICD-10-CM

## 2011-04-02 ENCOUNTER — Ambulatory Visit (HOSPITAL_COMMUNITY)
Admission: RE | Admit: 2011-04-02 | Discharge: 2011-04-02 | Disposition: A | Discharge status: Home / Self Care | Payer: BC Managed Care – PPO | Source: Ambulatory Visit | Attending: Oncology | Admitting: Oncology

## 2011-04-02 ENCOUNTER — Other Ambulatory Visit: Payer: Self-pay | Admitting: Oncology

## 2011-04-02 ENCOUNTER — Encounter (HOSPITAL_BASED_OUTPATIENT_CLINIC_OR_DEPARTMENT_OTHER): Payer: BC Managed Care – PPO | Admitting: Oncology

## 2011-04-02 DIAGNOSIS — R52 Pain, unspecified: Secondary | ICD-10-CM

## 2011-04-02 DIAGNOSIS — Z5111 Encounter for antineoplastic chemotherapy: Secondary | ICD-10-CM

## 2011-04-02 DIAGNOSIS — D500 Iron deficiency anemia secondary to blood loss (chronic): Secondary | ICD-10-CM

## 2011-04-02 DIAGNOSIS — O019 Hydatidiform mole, unspecified: Secondary | ICD-10-CM

## 2011-04-02 DIAGNOSIS — Y838 Other surgical procedures as the cause of abnormal reaction of the patient, or of later complication, without mention of misadventure at the time of the procedure: Secondary | ICD-10-CM | POA: Insufficient documentation

## 2011-04-02 DIAGNOSIS — D392 Neoplasm of uncertain behavior of placenta: Secondary | ICD-10-CM

## 2011-04-02 DIAGNOSIS — T85898A Other specified complication of other internal prosthetic devices, implants and grafts, initial encounter: Secondary | ICD-10-CM | POA: Insufficient documentation

## 2011-04-02 LAB — CBC WITH DIFFERENTIAL/PLATELET
Basophils Absolute: 0 10*3/uL (ref 0.0–0.1)
Eosinophils Absolute: 0.1 10*3/uL (ref 0.0–0.5)
HGB: 10.4 g/dL — ABNORMAL LOW (ref 11.6–15.9)
MCV: 70.8 fL — ABNORMAL LOW (ref 79.5–101.0)
MONO#: 0.1 10*3/uL (ref 0.1–0.9)
NEUT#: 4 10*3/uL (ref 1.5–6.5)
RDW: 15.5 % — ABNORMAL HIGH (ref 11.2–14.5)
WBC: 6.2 10*3/uL (ref 3.9–10.3)
lymph#: 1.9 10*3/uL (ref 0.9–3.3)

## 2011-04-02 LAB — COMPREHENSIVE METABOLIC PANEL
ALT: 16 U/L (ref 0–35)
Albumin: 2.9 g/dL — ABNORMAL LOW (ref 3.5–5.2)
CO2: 24 mEq/L (ref 19–32)
Chloride: 102 mEq/L (ref 96–112)
Glucose, Bld: 91 mg/dL (ref 70–99)
Potassium: 3.8 mEq/L (ref 3.5–5.3)
Sodium: 135 mEq/L (ref 135–145)
Total Protein: 6 g/dL (ref 6.0–8.3)

## 2011-04-02 LAB — HCG, QUANTITATIVE, PREGNANCY: hCG, Beta Chain, Quant, S: 124424 m[IU]/mL

## 2011-04-03 ENCOUNTER — Other Ambulatory Visit (HOSPITAL_COMMUNITY): Payer: Medicaid - Dental | Admitting: Dentistry

## 2011-04-03 DIAGNOSIS — M264 Malocclusion, unspecified: Secondary | ICD-10-CM

## 2011-04-03 DIAGNOSIS — K029 Dental caries, unspecified: Secondary | ICD-10-CM

## 2011-04-03 DIAGNOSIS — K08409 Partial loss of teeth, unspecified cause, unspecified class: Secondary | ICD-10-CM

## 2011-04-03 DIAGNOSIS — K089 Disorder of teeth and supporting structures, unspecified: Secondary | ICD-10-CM

## 2011-04-04 ENCOUNTER — Encounter (HOSPITAL_BASED_OUTPATIENT_CLINIC_OR_DEPARTMENT_OTHER): Payer: BC Managed Care – PPO | Admitting: Oncology

## 2011-04-04 DIAGNOSIS — Z452 Encounter for adjustment and management of vascular access device: Secondary | ICD-10-CM

## 2011-04-04 DIAGNOSIS — D392 Neoplasm of uncertain behavior of placenta: Secondary | ICD-10-CM

## 2011-04-07 ENCOUNTER — Other Ambulatory Visit: Payer: Self-pay | Admitting: Oncology

## 2011-04-07 ENCOUNTER — Encounter (HOSPITAL_BASED_OUTPATIENT_CLINIC_OR_DEPARTMENT_OTHER): Payer: Self-pay | Admitting: Oncology

## 2011-04-07 ENCOUNTER — Telehealth: Payer: Self-pay | Admitting: *Deleted

## 2011-04-07 DIAGNOSIS — Z452 Encounter for adjustment and management of vascular access device: Secondary | ICD-10-CM

## 2011-04-07 DIAGNOSIS — D392 Neoplasm of uncertain behavior of placenta: Secondary | ICD-10-CM

## 2011-04-07 LAB — CBC WITH DIFFERENTIAL/PLATELET
MCH: 23.2 pg — ABNORMAL LOW (ref 25.1–34.0)
MCHC: 32.9 g/dL (ref 31.5–36.0)
MONO#: 0.3 10*3/uL (ref 0.1–0.9)
MONO%: 11.5 % (ref 0.0–14.0)
NEUT%: 30.7 % — ABNORMAL LOW (ref 38.4–76.8)
RBC: 4.39 10*6/uL (ref 3.70–5.45)
WBC: 2.5 10*3/uL — ABNORMAL LOW (ref 3.9–10.3)
lymph#: 1.4 10*3/uL (ref 0.9–3.3)

## 2011-04-07 LAB — COMPREHENSIVE METABOLIC PANEL
Albumin: 3.5 g/dL (ref 3.5–5.2)
CO2: 22 mEq/L (ref 19–32)
Glucose, Bld: 119 mg/dL — ABNORMAL HIGH (ref 70–99)
Potassium: 3.8 mEq/L (ref 3.5–5.3)
Sodium: 137 mEq/L (ref 135–145)
Total Bilirubin: 0.3 mg/dL (ref 0.3–1.2)
Total Protein: 6.5 g/dL (ref 6.0–8.3)

## 2011-04-07 LAB — HCG, QUANTITATIVE, PREGNANCY: hCG, Beta Chain, Quant, S: 41636.4 m[IU]/mL

## 2011-04-07 NOTE — Telephone Encounter (Signed)
Pt left message on the voicemail that she is being treated for cancer and her BP is always abnormal at her treatments. Pt notes that she is taking Lisinopril but her BP seems to be 160-170/ 90-105. The cancer advised that she call our office for Dr. Ernst Spell recommendations.

## 2011-04-07 NOTE — Telephone Encounter (Signed)
We had she was on methyldopa----- she really needs ov.

## 2011-04-08 ENCOUNTER — Encounter: Payer: Self-pay | Admitting: Family Medicine

## 2011-04-08 NOTE — Telephone Encounter (Signed)
Spoke with patient and she stated she had a molar pregnancy which caused her to have cancer, she stated she had been on lisinopril for the las 3 weeks. I advised she would need an appt--scheduled patient for Thursday at 11:15 with Dr.lowne      KP

## 2011-04-09 ENCOUNTER — Encounter (HOSPITAL_BASED_OUTPATIENT_CLINIC_OR_DEPARTMENT_OTHER): Payer: BC Managed Care – PPO | Admitting: Oncology

## 2011-04-09 ENCOUNTER — Other Ambulatory Visit: Payer: Self-pay | Admitting: Oncology

## 2011-04-09 ENCOUNTER — Inpatient Hospital Stay: Admission: RE | Admit: 2011-04-09 | Payer: Self-pay | Source: Ambulatory Visit | Admitting: Oncology

## 2011-04-09 DIAGNOSIS — D392 Neoplasm of uncertain behavior of placenta: Secondary | ICD-10-CM

## 2011-04-09 DIAGNOSIS — Z452 Encounter for adjustment and management of vascular access device: Secondary | ICD-10-CM

## 2011-04-09 DIAGNOSIS — Z5111 Encounter for antineoplastic chemotherapy: Secondary | ICD-10-CM

## 2011-04-09 LAB — CBC WITH DIFFERENTIAL/PLATELET
BASO%: 0.8 % (ref 0.0–2.0)
EOS%: 2.2 % (ref 0.0–7.0)
MCH: 23.1 pg — ABNORMAL LOW (ref 25.1–34.0)
MCHC: 32.7 g/dL (ref 31.5–36.0)
RDW: 15.6 % — ABNORMAL HIGH (ref 11.2–14.5)
lymph#: 1.9 10*3/uL (ref 0.9–3.3)

## 2011-04-09 NOTE — Discharge Summary (Signed)
NAMEDANNON, PERLOW               ACCOUNT NO.:  [id number]  MEDICAL RECORD NO.:  [id number]           PATIENT TYPE:  I  LOCATION:  1312                         FACILITY:  [facility]  PHYSICIAN:  Reece Packer, M.D. DATE OF BIRTH:  Apr 20, 1971  DATE OF ADMISSION:  03/26/2011 DATE OF DISCHARGE:  03/28/2011                              DISCHARGE SUMMARY   HISTORY OF PRESENT ILLNESS:  The patient is a 40 year old lady with metastatic gestational trophoblastic disease, high risk, he was admitted urgently to begin chemotherapy on the EMA-CO regimen.  For details of the patient's presentation and course to this point, see the dictated H and P.  [name], past medical history, family history, social history is in the dictated H and P.  ADMISSION PHYSICAL EXAMINATION:  GENERAL:  Very pleasant lady, looks mildly uncomfortable and slightly short of breath, ambulating. VITAL SIGNS:  Temperature 98.1, heart rate 81 and regular, respirations 20, blood pressure 170/95.  Weight 305.6, height 70 inches. HEENT:  Pupils equal and reactive.  Extraocular movements intact.  No papilledema. NECK:  Supple.  No obvious thyroid masses.  No cervical, supraclavicular, bilateral axillary or bilateral inguinal adenopathy. LUNGS: Clear to auscultation and percussion. HEART:  Regular rate and rhythm without gallops. BREASTS:  Large.  No dominant masses.  No skin changes of concern.  No swelling in her upper extremities. ABDOMEN:  Soft, quiet, nontender in the epigastrium.  No palpable hepatosplenomegaly.  Dr. Forrestine Him exam on February 29 had had enlarged globular uterus with cul-de-sac negative. EXTREMITIES:  Lower Extremities:  No edema or cords. SKIN:  Not remarkable and no focal neurologic changes.  LABORATORY DATA:  Labs from Mar 25, 2011; white count 7000, hemoglobin 10.8, platelets 165, ANC 4.2, MCV 74.  Differential not remarkable. Sodium 135, potassium 3.5, chloride 102, CO2 of 22, glucose  112, BUN 6, creatinine 0.6, alk phos 28, GOT 20, GPT 24, total protein 7.1, albumin 3, calcium 8.7, total bili 0.2 and re-run quantitative HCG was 175, 721.  COURSE IN HOSPITAL:  The patient was admitted to the oncology unit.  She needed a central line placed to begin the chemotherapy, this accomplished by Interventional Radiology with double-lumen PICC placed afternoon of admission.  She was begun on IV fluids with D5W with 100 mEq of bicarb per liter plus 20 mEq potassium per liter initially at 250 cc an hour for the first liter, then 150 cc an hour.  Urine pH was followed on the bicarb IV fluid and was greater than 6.5 by the time that her methotrexate was begun.  She was treated with etoposide 100 mg/sq m equals 200 mg IV day #1 and day #2, dactinomycin 0.5 mg total dose IV day #1 and day #2, methotrexate 100 mg/sq m equals 200 mg bolus on day #1 followed by methotrexate 200 mg/sq m equals 400 mg IV continuous infusion over 12 hours.  Leucovorin was begun at 15 mg q.12 h given p.o. starting 24 hours after the methotrexate bolus.  Premedications day one were Zofran 16 mg IV plus EMEND 150 mg IV plus Decadron 20 mg IV and day #2 Zofran  16 mg IV plus Decadron 20 mg IV. She also did get benefit from 1 mg of Ativan on initial arrival to the oncology unit.  Because of the vaginal bleeding, she used PAS stockings for DVT prophylaxis rather than Lovenox.  Urine pH was followed and remained above 6.5 through the hospitalization after IV fluids were begun.  The patient tolerated all the treatment very well, with resolution of her nausea  after antiemetics given on day #1 and no further vomiting during the hospitalization.  Bowels had moved twice, small amount on the second hospital day.  She was seen both by hospital dietitian and by oncology chaplain during the admission.  Blood pressures stayed elevated and she was changed back to her usual lisinopril 40 mg daily (from [place] that had been  substituted when she was initially thought to be pregnant).  I's and O's were not matched by the time of discharge and I believe that this is also contributing to the hypertension at this point.  We will follow closely out of hospital. She had no obvious neurologic problems during the entirety of the hospitalization.  She did lose a front dental crown during the hospitalization.  She used Biotine on a regular basis following that.  The patient does not have dentist in [place] and I have left a message for the hospital dentist who will be available again to discuss with me on March 31, 2011.  DISCHARGE MEDICATIONS: 1. Leucovorin 15 mg p.o. q.12 h to complete 4 doses. 2. Lisinopril 40 mg daily. 3. Zofran 8 to 16 mg q.12 h p.r.n. 4. Protonix 40 mg daily. 5. Ativan 1 mg q.4-6 h p.r.n. for nausea. 6. Senokot-S one to two b.i.d. p.r.n. 7. Biotin mouth wash 3 to 5 times daily. 8. Ambien 5 mg at bedtime p.r.n.  FOLLOWUP:  Follow up will be at the [facility] with PICC flush at 10:00 a.m. on March 31, 2011, and she will speak with the nurse then.  She will have labs, MD visit and day #8 cycle 1 EMA-CO at the [facility] April 02, 2011, and will also have PICC flush on April 04, 2011.  The patient will call if no nausea or vomiting uncontrolled with the medications, if she is unable to keep down the p.o. leucovorin, if she has problems with the PICC line, if she has fevers or increased bleeding or any other questions or concerns.  ACTIVITY:  As tolerated and she is to keep the PICC line dry.  DIET:  As instructed and she did better in hospital with trace open outside of the room.  FINAL DIAGNOSES: 1. 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  EMA-C O chemotherapy begun with day #1 on     Mar 26, 2011. 2. Percutaneous intravascular central catheter placed for      chemotherapy. 3. Hypertension which preceded this diagnosis, now back on lisinopril     and will follow off the IV fluids. 4. Previous cesarean sections x2. 5. Loss of dental crown which will follow up outpatient with Dr.     Kristin Bruins. 6. Anemia.  Likely related to the vaginal bleeding.  Iron studies     drawn and pending on day of discharge and will follow up     outpatient.  CONDITION ON DISCHARGE:  Guarded but stable.     Reece Packer, M.D.  LPL/MEDQ  D:  03/28/2011  T:  03/28/2011  Job:  [id number]  cc:   Naima A. Normand Sloop, M.D. Fax: [phone]  Laurette Schimke, MD  Lelon Perla, DO [address] [place], [place] [address]  Electronically Signed by Jama Flavors M.D. on 04/09/2011 09:26:14 AM

## 2011-04-09 NOTE — H&P (Signed)
[name], [name]               ACCOUNT NO.:  [id number]  MEDICAL RECORD NO.:  [id number]           PATIENT TYPE:  I  LOCATION:  1312                         FACILITY:  [facility]  PHYSICIAN:  Reece Packer, M.D. DATE OF BIRTH:  1971/02/23  DATE OF ADMISSION:  03/26/2011 DATE OF DISCHARGE:                             HISTORY & PHYSICAL   HISTORY OF PRESENT ILLNESS:  The patient is a 41 year old lady with metastatic gestational trophoblastic disease (high risk) involving lungs, seen in urgent consultation at the request of Dr. Laurette Schimke on Mar 25, 2011, and admitted today to begin chemotherapy on EMA-CO regimen.  The patient had last regular menstrual period in December 2011, then a positive pregnancy test in late January 2012.  She was seen by her primary physician Dr. Loreen Freud and had her usual lisinopril 40 mg daily changed to methyldopa when she was thought to be pregnant.  She developed continuous nausea in March, then had vaginal bleeding in April.  A D and C was done on February 21, 2011, with pathology showing complete molar pregnancy ([id number]); beta HCG prior to the D and C was 292, 220 and after the D and C was 158 and 467.  On Mar 07, 2011, beta HCG was 263, 535 and on Mar 13, 2011, was 257, 443.  She was given methotrexate 50 mg/sq m on Mar 13, 2011, through Dr. Redmond Baseman office. Beta HCG on Mar 20, 2011, was 229, 530 with methotrexate repeated. Chest x-ray on Mar 13, 2011, was not remarkable.  Then CT of chest, abdomen, and pelvis on Mar 20, 2011, showed bilateral small pulmonary nodules, no adenopathy, liver not remarkable, bilateral small renal cysts and a large mass in the uterus (which per Dr. Forrestine Him note measured 11 x 11 x 9 cm).  Labs on Mar 13, 2011; white count 7.1, hemoglobin 11.6, platelets 152. Sodium 133, potassium 3.1, chloride 101, CO2 of 21, glucose 95, BUN 8, creatinine 0.64, total bili 0.2, alk phos 32, GOT 17, GPT 11, total protein  7.2, albumin 3, calcium 9.2.  HIV was negative and LDH was 291. She was seen by Dr. Laurette Schimke on Mar 25, 2011 with recommendation for EMA-CO chemotherapy due to high risk features of age greater than 39, greater than 4 metastatic deposits, low HCG level and previous methotrexate.  The patient was then seen for medical oncology as an urgent work-in new patient also Mar 25, 2011.  [name]:  Continuous nausea with vomiting up to 3 to 4 times in 24 hours daily.  Slight vaginal bleeding following each methotrexate injection.  Fatigue with exertion though she does not feel that she is frankly short of breath.  No chest pain or palpitations.  No other bleeding.  Weight has been steady.  She has had variable p.o. intake of fluids and some food.  Denies pain.  No headache or other neurologic symptoms.  Some nonproductive cough.  No fever.  No sinus drainage or sore throat.  No mucositis symptoms with the methotrexate.  Bowels have been moving but not daily, not diarrhea.  No bladder symptoms.  She has been sleeping poorly.  No GERD.  She has had hiccups occasionally.  She has not used antiemetics for several weeks after she was unable to keep down p.o. Phenergan and the rectal Phenergan was not helpful.  Per her pharmacy, she has not used other antiemetics.  [facility] at the [facility] higher than her baseline and then she recalls this was also more elevated last week.  PAST HISTORY:  Allergy to SULFA causing hives, had a cyst removed from her shoulder, wisdom teeth extracted, C-sections x2, gravida 4, para 2 with one neonatal demise and her one living child is a daughter aged 77. She has had hypertension since her first pregnancy and had been on the lisinopril 40 mg daily until she was changed to methyldopa in February. She has not had mammograms done.  FAMILY HISTORY:  Mother with diabetes and hypertension.  Father with hypertension.  Brother with no known medical  problems.  A daughter age 38 who is healthy.  SOCIAL HISTORY:  She is divorced, originally from [place], has lived in [place] for 7 years.  She teaches music at [employer] but she has been out of work with this illness since April.  No tobacco.  Social alcohol.  No transfusions.  PHYSICAL EXAMINATION:  GENERAL:  On exam on Mar 25, 2011, very pleasant lady, looks mildly uncomfortable in the exam room and slightly short of breath walking in the hall. VITAL SIGNS:  Temperature 98.1, heart rate 81 and regular, respirations 20, blood pressure 170/95, weight 305.6 pounds, height 70 inches. HEENT:  Pupils equal and reactive.  Extraocular movements intact. Nonicteric.  No papilledema. NECK:  Supple.  No peripheral adenopathy. LUNGS:  Clear to auscultation percussion.  No obvious thyromegaly. HEART:  Regular rate and rhythm without gallop. BREASTS:  Large with a smooth 0.5 cm probable cyst on the right at 2 o'clock.  No dominant masses.  No skin changes of concern.  No swelling in her bilateral upper extremities. ABDOMEN:  Soft, quiet, nontender in the epigastrium.  No appreciable hepatosplenomegaly or masses. GU:  Per Dr. Nelly Rout exam, uterus is enlarged with cul-de-sac negative. EXTREMITIES:  Lower Extremities: No edema or cords. SKIN:  Not remarkable. NEURO:  No focal neurologic changes.  LABORATORY DATA:  Labs on Mar 25, 2011; white count 7000, ANC of 4.2, hemoglobin 10.8, MCV of 74, differential not remarkable.  CMET; sodium 135, potassium 3.5, chloride 102, CO2 of 22, glucose 112, BUN 6, creatinine 0.65, total bili 0.2, alk phos 28, GOT 20, GPT 24, total protein 7.1, albumin 3 and calcium 8.7.  Beta HCG also sent with results not available at the time of this dictation.  I have discussed with her pharmacy who has been able to obtain dactinomycin for availablity in 24 hours.  IMPRESSION AND PLAN: 1. Metastatic high-risk gestational trophoblastic disease:   Will admit     to begin chemotherapy on EMA-CO regimen per recommendation of Dr.     Laurette Schimke.  I have reviewed the [facility] protocol for this regimen     and discussed with her pharmacist also.  She will need a central     line for the dactinomycin and vincristine, probably best for double-     lumen PICC.  The chemotherapy will be given on day 1, day 2 and day     8 every 2 weeks.  The day 8 treatment can probably be administered     outpatient if the patient is stable at  that point. 2. History of hypertension with more elevated blood pressures:  Will     change back to the previous lisinopril and follow. 3. Anemia, probably related to the GYN bleeding:  Followup CBCs and     check iron studies as the MCV is low. 4. Allergy to sulfa.  The patient will attend teaching class at our office tomorrow morning and we will admit from there for PICC line placement and to start chemotherapy hopefully on Mar 26, 2011.     Reece Packer, M.D.     LPL/MEDQ  D:  03/26/2011  T:  03/26/2011  Job:  [id number]  cc:   Laurette Schimke, MD  Naima A. Normand Sloop, M.D. Fax: [phone]  Lelon Perla, DO [address] [place], [place] [address]  Electronically Signed by Jama Flavors M.D. on 04/09/2011 09:24:48 AM

## 2011-04-10 ENCOUNTER — Encounter: Payer: Self-pay | Admitting: Family Medicine

## 2011-04-10 ENCOUNTER — Ambulatory Visit (INDEPENDENT_AMBULATORY_CARE_PROVIDER_SITE_OTHER): Payer: BC Managed Care – PPO | Admitting: Family Medicine

## 2011-04-10 VITALS — BP 160/102 | HR 111 | Temp 99.3°F | Wt 297.6 lb

## 2011-04-10 DIAGNOSIS — I10 Essential (primary) hypertension: Secondary | ICD-10-CM

## 2011-04-10 LAB — BASIC METABOLIC PANEL
GFR: 118.14 mL/min (ref >60.00)
Potassium: 4.4 mEq/L (ref 3.5–5.1)
Sodium: 138 mEq/L (ref 135–145)

## 2011-04-10 MED ORDER — AMLODIPINE BESYLATE 5 MG PO TABS: 5.0000 mg | ORAL_TABLET | Freq: Every day | ORAL | Status: DC

## 2011-04-10 NOTE — Progress Notes (Signed)
  Subjective:    Patient here for follow-up of elevated blood pressure.  She is not exercising and is adherent to a low-salt diet.  Blood pressure is not well controlled at home. Cardiac symptoms: none. Patient denies: chest pain, chest pressure/discomfort, dyspnea, irregular heart beat and palpitations. Cardiovascular risk factors: hypertension and obesity (BMI >= 30 kg/m2). Use of agents associated with hypertension: none. History of target organ damage: none.  The following portions of the patient's history were reviewed and updated as appropriate: allergies, current medications, past family history, past medical history, past social history, past surgical history and problem list.  [name] Pertinent items are noted in HPI.     Objective:    BP 160/102  Pulse 111  Temp(Src) 99.3 F (37.4 C) (Oral)  Wt 297 lb 9.6 oz (134.99 kg)  SpO2 97% General appearance: alert, cooperative, appears stated age and no distress Lungs: clear to auscultation bilaterally Heart: regular rate and rhythm, S1, S2 normal, no murmur, click, rub or gallop Extremities: extremities normal, atraumatic, no cyanosis or edema    Assessment:    Hypertension, stage 2 . Evidence of target organ damage: none.    Plan:    Medication: continue lisinopril 40 mg and begin norvasc 5. Dietary sodium restriction. Regular aerobic exercise. Check blood pressures 1-2  times weekly and record. Follow up: 2 weeks and as needed.

## 2011-04-10 NOTE — Patient Instructions (Signed)

## 2011-04-10 NOTE — Progress Notes (Signed)
Addended by: [name] on: 04/10/2011 11:42 AM   Modules accepted: Orders

## 2011-04-11 ENCOUNTER — Encounter (HOSPITAL_BASED_OUTPATIENT_CLINIC_OR_DEPARTMENT_OTHER): Payer: BC Managed Care – PPO | Admitting: Oncology

## 2011-04-11 DIAGNOSIS — Z452 Encounter for adjustment and management of vascular access device: Secondary | ICD-10-CM

## 2011-04-11 DIAGNOSIS — D392 Neoplasm of uncertain behavior of placenta: Secondary | ICD-10-CM

## 2011-04-14 ENCOUNTER — Encounter (HOSPITAL_COMMUNITY): Payer: Medicaid - Dental | Admitting: Dentistry

## 2011-04-14 ENCOUNTER — Other Ambulatory Visit: Payer: Self-pay | Admitting: Oncology

## 2011-04-14 ENCOUNTER — Encounter (HOSPITAL_BASED_OUTPATIENT_CLINIC_OR_DEPARTMENT_OTHER): Payer: BC Managed Care – PPO | Admitting: Oncology

## 2011-04-14 DIAGNOSIS — K000 Anodontia: Secondary | ICD-10-CM

## 2011-04-14 DIAGNOSIS — Z452 Encounter for adjustment and management of vascular access device: Secondary | ICD-10-CM

## 2011-04-14 DIAGNOSIS — K0850 Unsatisfactory restoration of tooth, unspecified: Secondary | ICD-10-CM

## 2011-04-14 DIAGNOSIS — D392 Neoplasm of uncertain behavior of placenta: Secondary | ICD-10-CM

## 2011-04-14 LAB — CBC WITH DIFFERENTIAL/PLATELET
BASO%: 0.6 % (ref 0.0–2.0)
Basophils Absolute: 0.1 10*3/uL (ref 0.0–0.1)
HCT: 34.2 % — ABNORMAL LOW (ref 34.8–46.6)
HGB: 11 g/dL — ABNORMAL LOW (ref 11.6–15.9)
MCHC: 32.2 g/dL (ref 31.5–36.0)
MONO#: 0.9 10*3/uL (ref 0.1–0.9)
NEUT%: 65.9 % (ref 38.4–76.8)
WBC: 9.9 10*3/uL (ref 3.9–10.3)
lymph#: 2.3 10*3/uL (ref 0.9–3.3)

## 2011-04-14 LAB — COMPREHENSIVE METABOLIC PANEL
AST: 20 U/L (ref 0–37)
Albumin: 3.9 g/dL (ref 3.5–5.2)
Alkaline Phosphatase: 46 U/L (ref 39–117)
Chloride: 103 mEq/L (ref 96–112)
Glucose, Bld: 133 mg/dL — ABNORMAL HIGH (ref 70–99)
Potassium: 4 mEq/L (ref 3.5–5.3)
Sodium: 137 mEq/L (ref 135–145)
Total Protein: 7.6 g/dL (ref 6.0–8.3)

## 2011-04-15 DIAGNOSIS — K1370 Unspecified lesions of oral mucosa: Secondary | ICD-10-CM

## 2011-04-16 ENCOUNTER — Inpatient Hospital Stay (HOSPITAL_COMMUNITY): Payer: BC Managed Care – PPO

## 2011-04-16 ENCOUNTER — Encounter (HOSPITAL_COMMUNITY): Payer: Medicaid - Dental | Admitting: Dentistry

## 2011-04-16 ENCOUNTER — Inpatient Hospital Stay (HOSPITAL_COMMUNITY)
Admission: AD | Admit: 2011-04-16 | Discharge: 2011-04-18 | DRG: 410 | Disposition: A | Discharge status: Home / Self Care | Payer: BC Managed Care – PPO | Source: Ambulatory Visit | Attending: Oncology | Admitting: Oncology

## 2011-04-16 DIAGNOSIS — Y92009 Unspecified place in unspecified non-institutional (private) residence as the place of occurrence of the external cause: Secondary | ICD-10-CM

## 2011-04-16 DIAGNOSIS — Z5111 Encounter for antineoplastic chemotherapy: Principal | ICD-10-CM

## 2011-04-16 DIAGNOSIS — E669 Obesity, unspecified: Secondary | ICD-10-CM | POA: Diagnosis present

## 2011-04-16 DIAGNOSIS — T46905A Adverse effect of unspecified agents primarily affecting the cardiovascular system, initial encounter: Secondary | ICD-10-CM | POA: Diagnosis present

## 2011-04-16 DIAGNOSIS — R05 Cough: Secondary | ICD-10-CM | POA: Diagnosis present

## 2011-04-16 DIAGNOSIS — D392 Neoplasm of uncertain behavior of placenta: Secondary | ICD-10-CM | POA: Diagnosis present

## 2011-04-16 DIAGNOSIS — R059 Cough, unspecified: Secondary | ICD-10-CM | POA: Diagnosis present

## 2011-04-16 DIAGNOSIS — I10 Essential (primary) hypertension: Secondary | ICD-10-CM | POA: Diagnosis present

## 2011-04-16 DIAGNOSIS — Z882 Allergy status to sulfonamides status: Secondary | ICD-10-CM

## 2011-04-16 LAB — URINALYSIS, ROUTINE W REFLEX MICROSCOPIC
Bilirubin Urine: NEGATIVE
Glucose, UA: NEGATIVE mg/dL
Glucose, UA: NEGATIVE mg/dL
Glucose, UA: NEGATIVE mg/dL
Ketones, ur: NEGATIVE mg/dL
Nitrite: NEGATIVE
Protein, ur: NEGATIVE mg/dL
Protein, ur: NEGATIVE mg/dL
Protein, ur: NEGATIVE mg/dL
Specific Gravity, Urine: 1.018 (ref 1.005–1.030)
Urobilinogen, UA: 0.2 mg/dL (ref 0.0–1.0)

## 2011-04-16 LAB — URINE MICROSCOPIC-ADD ON

## 2011-04-16 MED ORDER — IOHEXOL 300 MG/ML  SOLN: 100.0000 mL | Freq: Once | INTRAMUSCULAR | Status: AC | PRN

## 2011-04-17 LAB — BASIC METABOLIC PANEL
BUN: 6 mg/dL (ref 6–23)
CO2: 27 mEq/L (ref 19–32)
Calcium: 9.3 mg/dL (ref 8.4–10.5)
Creatinine, Ser: 0.71 mg/dL (ref 0.50–1.10)
GFR calc non Af Amer: >60 mL/min (ref >60)
Glucose, Bld: 216 mg/dL — ABNORMAL HIGH (ref 70–99)
Sodium: 134 mEq/L — ABNORMAL LOW (ref 135–145)

## 2011-04-17 LAB — URINALYSIS, ROUTINE W REFLEX MICROSCOPIC
Bilirubin Urine: NEGATIVE
Bilirubin Urine: NEGATIVE
Glucose, UA: NEGATIVE mg/dL
Glucose, UA: NEGATIVE mg/dL
Glucose, UA: NEGATIVE mg/dL
Ketones, ur: NEGATIVE mg/dL
Ketones, ur: NEGATIVE mg/dL
Leukocytes, UA: NEGATIVE
Leukocytes, UA: NEGATIVE
Nitrite: NEGATIVE
Protein, ur: NEGATIVE mg/dL
Protein, ur: NEGATIVE mg/dL
Protein, ur: NEGATIVE mg/dL
Specific Gravity, Urine: 1.003 — ABNORMAL LOW (ref 1.005–1.030)
Specific Gravity, Urine: 1.004 — ABNORMAL LOW (ref 1.005–1.030)
Urobilinogen, UA: 0.2 mg/dL (ref 0.0–1.0)
Urobilinogen, UA: 0.2 mg/dL (ref 0.0–1.0)
Urobilinogen, UA: 0.2 mg/dL (ref 0.0–1.0)
pH: 8 (ref 5.0–8.0)

## 2011-04-17 LAB — URINE MICROSCOPIC-ADD ON

## 2011-04-17 LAB — GLUCOSE, CAPILLARY: Glucose-Capillary: 171 mg/dL — ABNORMAL HIGH (ref 70–99)

## 2011-04-18 LAB — URINALYSIS, ROUTINE W REFLEX MICROSCOPIC
Bilirubin Urine: NEGATIVE
Glucose, UA: NEGATIVE mg/dL
Hgb urine dipstick: NEGATIVE
Ketones, ur: NEGATIVE mg/dL
Ketones, ur: NEGATIVE mg/dL
Leukocytes, UA: NEGATIVE
Leukocytes, UA: NEGATIVE
Nitrite: NEGATIVE
Nitrite: NEGATIVE
Protein, ur: NEGATIVE mg/dL
Protein, ur: NEGATIVE mg/dL
Specific Gravity, Urine: 1.012 (ref 1.005–1.030)
Urobilinogen, UA: 0.2 mg/dL (ref 0.0–1.0)
Urobilinogen, UA: 0.2 mg/dL (ref 0.0–1.0)
pH: 8 (ref 5.0–8.0)

## 2011-04-19 ENCOUNTER — Inpatient Hospital Stay (HOSPITAL_COMMUNITY): Payer: BC Managed Care – PPO

## 2011-04-19 ENCOUNTER — Inpatient Hospital Stay (HOSPITAL_COMMUNITY)
Admission: AD | Admit: 2011-04-19 | Discharge: 2011-04-20 | Disposition: A | Discharge status: Home / Self Care | Payer: BC Managed Care – PPO | Source: Ambulatory Visit | Attending: Obstetrics and Gynecology | Admitting: Obstetrics and Gynecology

## 2011-04-19 DIAGNOSIS — R109 Unspecified abdominal pain: Secondary | ICD-10-CM | POA: Insufficient documentation

## 2011-04-19 DIAGNOSIS — C58 Malignant neoplasm of placenta: Secondary | ICD-10-CM | POA: Insufficient documentation

## 2011-04-19 LAB — URINALYSIS, ROUTINE W REFLEX MICROSCOPIC
Bilirubin Urine: NEGATIVE
Glucose, UA: NEGATIVE mg/dL
Ketones, ur: NEGATIVE mg/dL
Protein, ur: NEGATIVE mg/dL

## 2011-04-19 LAB — CBC
HCT: 33 % — ABNORMAL LOW (ref 36.0–46.0)
Platelets: 233 10*3/uL (ref 150–400)
RBC: 4.74 MIL/uL (ref 3.87–5.11)
RDW: 16.1 % — ABNORMAL HIGH (ref 11.5–15.5)
WBC: 11.7 10*3/uL — ABNORMAL HIGH (ref 4.0–10.5)

## 2011-04-19 LAB — COMPREHENSIVE METABOLIC PANEL
AST: 13 U/L (ref 0–37)
Albumin: 3.1 g/dL — ABNORMAL LOW (ref 3.5–5.2)
Alkaline Phosphatase: 46 U/L (ref 39–117)
BUN: 16 mg/dL (ref 6–23)
CO2: 24 mEq/L (ref 19–32)
Chloride: 98 mEq/L (ref 96–112)
Potassium: 3.4 mEq/L — ABNORMAL LOW (ref 3.5–5.1)
Total Bilirubin: 0.2 mg/dL — ABNORMAL LOW (ref 0.3–1.2)

## 2011-04-19 LAB — URINE MICROSCOPIC-ADD ON

## 2011-04-21 ENCOUNTER — Other Ambulatory Visit: Payer: Self-pay | Admitting: Oncology

## 2011-04-21 ENCOUNTER — Encounter (HOSPITAL_BASED_OUTPATIENT_CLINIC_OR_DEPARTMENT_OTHER): Payer: BC Managed Care – PPO | Admitting: Oncology

## 2011-04-21 DIAGNOSIS — Z452 Encounter for adjustment and management of vascular access device: Secondary | ICD-10-CM

## 2011-04-21 DIAGNOSIS — M264 Malocclusion, unspecified: Secondary | ICD-10-CM

## 2011-04-21 DIAGNOSIS — D392 Neoplasm of uncertain behavior of placenta: Secondary | ICD-10-CM

## 2011-04-21 DIAGNOSIS — K000 Anodontia: Secondary | ICD-10-CM

## 2011-04-21 LAB — COMPREHENSIVE METABOLIC PANEL
AST: 10 U/L (ref 0–37)
BUN: 15 mg/dL (ref 6–23)
CO2: 23 mEq/L (ref 19–32)
Calcium: 8.6 mg/dL (ref 8.4–10.5)
Chloride: 101 mEq/L (ref 96–112)
Creatinine, Ser: 0.86 mg/dL (ref 0.50–1.10)
Total Bilirubin: 0.3 mg/dL (ref 0.3–1.2)

## 2011-04-21 LAB — CBC WITH DIFFERENTIAL/PLATELET
BASO%: 0.2 % (ref 0.0–2.0)
EOS%: 1.1 % (ref 0.0–7.0)
HCT: 34.4 % — ABNORMAL LOW (ref 34.8–46.6)
LYMPH%: 22.9 % (ref 14.0–49.7)
MCH: 23.6 pg — ABNORMAL LOW (ref 25.1–34.0)
MCHC: 32 g/dL (ref 31.5–36.0)
MONO%: 0.3 % (ref 0.0–14.0)
NEUT%: 75.5 % (ref 38.4–76.8)
Platelets: 204 10*3/uL (ref 145–400)

## 2011-04-23 ENCOUNTER — Encounter (HOSPITAL_BASED_OUTPATIENT_CLINIC_OR_DEPARTMENT_OTHER): Payer: BC Managed Care – PPO | Admitting: Oncology

## 2011-04-23 ENCOUNTER — Other Ambulatory Visit: Payer: Self-pay | Admitting: Oncology

## 2011-04-23 DIAGNOSIS — D392 Neoplasm of uncertain behavior of placenta: Secondary | ICD-10-CM

## 2011-04-23 DIAGNOSIS — Z452 Encounter for adjustment and management of vascular access device: Secondary | ICD-10-CM

## 2011-04-23 DIAGNOSIS — Z5111 Encounter for antineoplastic chemotherapy: Secondary | ICD-10-CM

## 2011-04-23 LAB — URINALYSIS, MICROSCOPIC - CHCC
Glucose: NEGATIVE g/dL
Ketones: NEGATIVE mg/dL
Specific Gravity, Urine: 1.015 (ref 1.003–1.035)

## 2011-04-24 ENCOUNTER — Encounter (HOSPITAL_BASED_OUTPATIENT_CLINIC_OR_DEPARTMENT_OTHER): Payer: BC Managed Care – PPO | Admitting: Oncology

## 2011-04-24 ENCOUNTER — Ambulatory Visit (INDEPENDENT_AMBULATORY_CARE_PROVIDER_SITE_OTHER): Payer: BC Managed Care – PPO | Admitting: Family Medicine

## 2011-04-24 ENCOUNTER — Encounter: Payer: Self-pay | Admitting: Family Medicine

## 2011-04-24 VITALS — BP 142/89 | HR 86 | Temp 98.9°F | Wt 293.8 lb

## 2011-04-24 DIAGNOSIS — Z5111 Encounter for antineoplastic chemotherapy: Secondary | ICD-10-CM

## 2011-04-24 DIAGNOSIS — I10 Essential (primary) hypertension: Secondary | ICD-10-CM

## 2011-04-24 DIAGNOSIS — D392 Neoplasm of uncertain behavior of placenta: Secondary | ICD-10-CM

## 2011-04-24 MED ORDER — AMLODIPINE BESYLATE 10 MG PO TABS: 10.0000 mg | ORAL_TABLET | Freq: Every day | ORAL | Status: DC

## 2011-04-24 NOTE — Progress Notes (Signed)
  Subjective:    Patient here for follow-up of elevated blood pressure.  She is exercising and is adherent to a low-salt diet.  Blood pressure is not well controlled at home. Cardiac symptoms: none. Patient denies: chest pain, dyspnea, exertional chest pressure/discomfort, fatigue, irregular heart beat and palpitations. Cardiovascular risk factors: hypertension and obesity (BMI >= 30 kg/m2). Use of agents associated with hypertension: none. History of target organ damage: none.  The following portions of the patient's history were reviewed and updated as appropriate: allergies, current medications, past family history, past medical history, past social history, past surgical history and problem list.  [name] Pertinent items are noted in HPI.     Objective:    BP 142/89  Pulse 86  Temp(Src) 98.9 F (37.2 C) (Oral)  Wt 293 lb 12.8 oz (133.267 kg)  SpO2 99% General appearance: alert, cooperative, appears stated age and no distress Neck: no adenopathy, no carotid bruit, no JVD, supple, symmetrical, trachea midline and thyroid not enlarged, symmetric, no tenderness/mass/nodules Lungs: clear to auscultation bilaterally Heart: regular rate and rhythm, S1, S2 normal, no murmur, click, rub or gallop Extremities: extremities normal, atraumatic, no cyanosis or edema    Assessment:    Hypertension, stage 1 . Evidence of target organ damage: none.    Plan:    Medication: increase to norvasc 10 mg . Dietary sodium restriction. Regular aerobic exercise. Check blood pressures 2-3 times weekly and record. Follow up: 3 months and as needed.  con't lisinopril

## 2011-04-24 NOTE — Patient Instructions (Signed)

## 2011-04-25 ENCOUNTER — Encounter (HOSPITAL_BASED_OUTPATIENT_CLINIC_OR_DEPARTMENT_OTHER): Payer: BC Managed Care – PPO | Admitting: Oncology

## 2011-04-25 DIAGNOSIS — Z5111 Encounter for antineoplastic chemotherapy: Secondary | ICD-10-CM

## 2011-04-25 DIAGNOSIS — D392 Neoplasm of uncertain behavior of placenta: Secondary | ICD-10-CM

## 2011-04-25 LAB — URINE CULTURE

## 2011-04-26 ENCOUNTER — Encounter (HOSPITAL_BASED_OUTPATIENT_CLINIC_OR_DEPARTMENT_OTHER): Payer: BC Managed Care – PPO | Admitting: Oncology

## 2011-04-26 DIAGNOSIS — Z5111 Encounter for antineoplastic chemotherapy: Secondary | ICD-10-CM

## 2011-04-26 DIAGNOSIS — D392 Neoplasm of uncertain behavior of placenta: Secondary | ICD-10-CM

## 2011-04-28 ENCOUNTER — Other Ambulatory Visit: Payer: Self-pay | Admitting: Oncology

## 2011-04-28 ENCOUNTER — Encounter (HOSPITAL_BASED_OUTPATIENT_CLINIC_OR_DEPARTMENT_OTHER): Payer: BC Managed Care – PPO | Admitting: Oncology

## 2011-04-28 DIAGNOSIS — D392 Neoplasm of uncertain behavior of placenta: Secondary | ICD-10-CM

## 2011-04-28 DIAGNOSIS — Z452 Encounter for adjustment and management of vascular access device: Secondary | ICD-10-CM

## 2011-04-28 LAB — COMPREHENSIVE METABOLIC PANEL
ALT: 15 U/L (ref 0–35)
Albumin: 3.7 g/dL (ref 3.5–5.2)
CO2: 22 mEq/L (ref 19–32)
Calcium: 8.6 mg/dL (ref 8.4–10.5)
Chloride: 105 mEq/L (ref 96–112)
Creatinine, Ser: 0.79 mg/dL (ref 0.50–1.10)
Potassium: 3.9 mEq/L (ref 3.5–5.3)
Total Protein: 6.9 g/dL (ref 6.0–8.3)

## 2011-04-28 LAB — CBC WITH DIFFERENTIAL/PLATELET
Eosinophils Absolute: 0.1 10*3/uL (ref 0.0–0.5)
HCT: 32.1 % — ABNORMAL LOW (ref 34.8–46.6)
LYMPH%: 33.3 % (ref 14.0–49.7)
MONO#: 0.6 10*3/uL (ref 0.1–0.9)
NEUT#: 2.7 10*3/uL (ref 1.5–6.5)
Platelets: 109 10*3/uL — ABNORMAL LOW (ref 145–400)
RBC: 4.59 10*6/uL (ref 3.70–5.45)
WBC: 5.3 10*3/uL (ref 3.9–10.3)
nRBC: 0 % (ref 0–0)

## 2011-04-28 LAB — HCG, QUANTITATIVE, PREGNANCY: hCG, Beta Chain, Quant, S: 165 m[IU]/mL

## 2011-04-30 ENCOUNTER — Inpatient Hospital Stay (HOSPITAL_COMMUNITY): Payer: BC Managed Care – PPO

## 2011-04-30 ENCOUNTER — Inpatient Hospital Stay (HOSPITAL_COMMUNITY)
Admission: AD | Admit: 2011-04-30 | Discharge: 2011-05-02 | DRG: 410 | Disposition: A | Discharge status: Home / Self Care | Payer: BC Managed Care – PPO | Source: Ambulatory Visit | Attending: Oncology | Admitting: Oncology

## 2011-04-30 DIAGNOSIS — T451X5A Adverse effect of antineoplastic and immunosuppressive drugs, initial encounter: Secondary | ICD-10-CM | POA: Diagnosis present

## 2011-04-30 DIAGNOSIS — R05 Cough: Secondary | ICD-10-CM | POA: Diagnosis present

## 2011-04-30 DIAGNOSIS — D392 Neoplasm of uncertain behavior of placenta: Secondary | ICD-10-CM

## 2011-04-30 DIAGNOSIS — D6481 Anemia due to antineoplastic chemotherapy: Secondary | ICD-10-CM | POA: Diagnosis present

## 2011-04-30 DIAGNOSIS — Z5111 Encounter for antineoplastic chemotherapy: Principal | ICD-10-CM

## 2011-04-30 DIAGNOSIS — D500 Iron deficiency anemia secondary to blood loss (chronic): Secondary | ICD-10-CM | POA: Diagnosis present

## 2011-04-30 DIAGNOSIS — R059 Cough, unspecified: Secondary | ICD-10-CM | POA: Diagnosis present

## 2011-04-30 DIAGNOSIS — R209 Unspecified disturbances of skin sensation: Secondary | ICD-10-CM | POA: Diagnosis present

## 2011-04-30 DIAGNOSIS — N390 Urinary tract infection, site not specified: Secondary | ICD-10-CM | POA: Diagnosis present

## 2011-04-30 DIAGNOSIS — C58 Malignant neoplasm of placenta: Secondary | ICD-10-CM | POA: Diagnosis present

## 2011-04-30 DIAGNOSIS — I10 Essential (primary) hypertension: Secondary | ICD-10-CM | POA: Diagnosis present

## 2011-04-30 LAB — DIFFERENTIAL
Basophils Absolute: 0.3 10*3/uL — ABNORMAL HIGH (ref 0.0–0.1)
Basophils Relative: 3 % — ABNORMAL HIGH (ref 0–1)
Eosinophils Absolute: 0.2 10*3/uL (ref 0.0–0.7)
Monocytes Absolute: 1.2 10*3/uL — ABNORMAL HIGH (ref 0.1–1.0)
Neutrophils Relative %: 60 % (ref 43–77)

## 2011-04-30 LAB — COMPREHENSIVE METABOLIC PANEL
Alkaline Phosphatase: 46 U/L (ref 39–117)
BUN: 11 mg/dL (ref 6–23)
CO2: 23 mEq/L (ref 19–32)
Chloride: 105 mEq/L (ref 96–112)
Creatinine, Ser: 0.76 mg/dL (ref 0.50–1.10)
GFR calc Af Amer: >60 mL/min (ref >60)
GFR calc non Af Amer: >60 mL/min (ref >60)
Glucose, Bld: 143 mg/dL — ABNORMAL HIGH (ref 70–99)
Potassium: 3.6 mEq/L (ref 3.5–5.1)
Total Bilirubin: 0.1 mg/dL — ABNORMAL LOW (ref 0.3–1.2)

## 2011-04-30 LAB — URINALYSIS, DIPSTICK ONLY
Glucose, UA: NEGATIVE mg/dL
Glucose, UA: NEGATIVE mg/dL
Leukocytes, UA: NEGATIVE
Specific Gravity, Urine: 1.005 (ref 1.005–1.030)
pH: 6.5 (ref 5.0–8.0)

## 2011-04-30 LAB — CBC
Hemoglobin: 10 g/dL — ABNORMAL LOW (ref 12.0–15.0)
MCH: 22.8 pg — ABNORMAL LOW (ref 26.0–34.0)
MCHC: 32.1 g/dL (ref 30.0–36.0)
RDW: 15.5 % (ref 11.5–15.5)

## 2011-04-30 LAB — URINALYSIS, ROUTINE W REFLEX MICROSCOPIC
Glucose, UA: NEGATIVE mg/dL
Ketones, ur: NEGATIVE mg/dL
Leukocytes, UA: NEGATIVE
Protein, ur: NEGATIVE mg/dL

## 2011-04-30 LAB — URINE MICROSCOPIC-ADD ON

## 2011-05-01 LAB — URINE CULTURE
Colony Count: NO GROWTH
Culture: NO GROWTH
Special Requests: NEGATIVE

## 2011-05-01 LAB — URINALYSIS, DIPSTICK ONLY
Bilirubin Urine: NEGATIVE
Glucose, UA: >1000 mg/dL — AB
Hgb urine dipstick: NEGATIVE
Leukocytes, UA: NEGATIVE
Nitrite: NEGATIVE
Protein, ur: NEGATIVE mg/dL
Specific Gravity, Urine: 1.006 (ref 1.005–1.030)
pH: 8 (ref 5.0–8.0)

## 2011-05-01 LAB — BASIC METABOLIC PANEL
CO2: 26 mEq/L (ref 19–32)
Calcium: 9.2 mg/dL (ref 8.4–10.5)
Creatinine, Ser: 0.7 mg/dL (ref 0.50–1.10)
Glucose, Bld: 192 mg/dL — ABNORMAL HIGH (ref 70–99)
Sodium: 135 mEq/L (ref 135–145)

## 2011-05-01 LAB — CBC
Hemoglobin: 10 g/dL — ABNORMAL LOW (ref 12.0–15.0)
MCH: 22.2 pg — ABNORMAL LOW (ref 26.0–34.0)
MCV: 70.3 fL — ABNORMAL LOW (ref 78.0–100.0)
RBC: 4.51 MIL/uL (ref 3.87–5.11)

## 2011-05-02 LAB — BASIC METABOLIC PANEL
CO2: 26 mEq/L (ref 19–32)
Calcium: 9.5 mg/dL (ref 8.4–10.5)
GFR calc Af Amer: >60 mL/min (ref >60)
GFR calc non Af Amer: >60 mL/min (ref >60)
Sodium: 137 mEq/L (ref 135–145)

## 2011-05-02 LAB — URINALYSIS, DIPSTICK ONLY
Bilirubin Urine: NEGATIVE
Leukocytes, UA: NEGATIVE
Nitrite: NEGATIVE
Specific Gravity, Urine: 1.006 (ref 1.005–1.030)
Urobilinogen, UA: 0.2 mg/dL (ref 0.0–1.0)
pH: 8 (ref 5.0–8.0)

## 2011-05-05 ENCOUNTER — Encounter (HOSPITAL_BASED_OUTPATIENT_CLINIC_OR_DEPARTMENT_OTHER): Payer: BC Managed Care – PPO | Admitting: Oncology

## 2011-05-05 DIAGNOSIS — D392 Neoplasm of uncertain behavior of placenta: Secondary | ICD-10-CM

## 2011-05-05 DIAGNOSIS — Z452 Encounter for adjustment and management of vascular access device: Secondary | ICD-10-CM

## 2011-05-07 ENCOUNTER — Other Ambulatory Visit: Payer: Self-pay | Admitting: Oncology

## 2011-05-07 ENCOUNTER — Encounter (HOSPITAL_BASED_OUTPATIENT_CLINIC_OR_DEPARTMENT_OTHER): Payer: BC Managed Care – PPO | Admitting: Oncology

## 2011-05-07 ENCOUNTER — Ambulatory Visit (HOSPITAL_COMMUNITY)
Admission: RE | Admit: 2011-05-07 | Discharge: 2011-05-07 | Disposition: A | Discharge status: Home / Self Care | Payer: BC Managed Care – PPO | Source: Ambulatory Visit | Attending: Oncology | Admitting: Oncology

## 2011-05-07 DIAGNOSIS — Z452 Encounter for adjustment and management of vascular access device: Secondary | ICD-10-CM

## 2011-05-07 DIAGNOSIS — D392 Neoplasm of uncertain behavior of placenta: Secondary | ICD-10-CM

## 2011-05-07 DIAGNOSIS — I82629 Acute embolism and thrombosis of deep veins of unspecified upper extremity: Secondary | ICD-10-CM | POA: Insufficient documentation

## 2011-05-07 DIAGNOSIS — M7989 Other specified soft tissue disorders: Secondary | ICD-10-CM | POA: Insufficient documentation

## 2011-05-07 DIAGNOSIS — O019 Hydatidiform mole, unspecified: Secondary | ICD-10-CM

## 2011-05-07 DIAGNOSIS — M79609 Pain in unspecified limb: Secondary | ICD-10-CM | POA: Insufficient documentation

## 2011-05-07 LAB — CBC WITH DIFFERENTIAL/PLATELET
Basophils Absolute: 0 10*3/uL (ref 0.0–0.1)
Eosinophils Absolute: 0.1 10*3/uL (ref 0.0–0.5)
HGB: 9.8 g/dL — ABNORMAL LOW (ref 11.6–15.9)
NEUT#: 4.1 10*3/uL (ref 1.5–6.5)
RBC: 4.28 10*6/uL (ref 3.70–5.45)
RDW: 15.6 % — ABNORMAL HIGH (ref 11.2–14.5)
WBC: 6.3 10*3/uL (ref 3.9–10.3)
lymph#: 1.7 10*3/uL (ref 0.9–3.3)
nRBC: 0 % (ref 0–0)

## 2011-05-07 LAB — URINALYSIS, MICROSCOPIC - CHCC
Bilirubin (Urine): NEGATIVE
Glucose: NEGATIVE g/dL
Leukocyte Esterase: NEGATIVE
Specific Gravity, Urine: 1.02 (ref 1.003–1.035)
WBC, UA: NEGATIVE (ref 0–2)

## 2011-05-08 ENCOUNTER — Encounter (HOSPITAL_BASED_OUTPATIENT_CLINIC_OR_DEPARTMENT_OTHER): Payer: BC Managed Care – PPO | Admitting: Oncology

## 2011-05-08 ENCOUNTER — Encounter (HOSPITAL_COMMUNITY): Payer: Self-pay | Admitting: Dentistry

## 2011-05-08 DIAGNOSIS — I82629 Acute embolism and thrombosis of deep veins of unspecified upper extremity: Secondary | ICD-10-CM

## 2011-05-08 DIAGNOSIS — K08109 Complete loss of teeth, unspecified cause, unspecified class: Secondary | ICD-10-CM

## 2011-05-08 LAB — URINE CULTURE

## 2011-05-09 ENCOUNTER — Encounter (HOSPITAL_BASED_OUTPATIENT_CLINIC_OR_DEPARTMENT_OTHER): Payer: BC Managed Care – PPO | Admitting: Oncology

## 2011-05-10 ENCOUNTER — Encounter: Payer: BC Managed Care – PPO | Admitting: Oncology

## 2011-05-11 ENCOUNTER — Encounter: Payer: BC Managed Care – PPO | Admitting: Oncology

## 2011-05-11 DIAGNOSIS — I82629 Acute embolism and thrombosis of deep veins of unspecified upper extremity: Secondary | ICD-10-CM

## 2011-05-12 ENCOUNTER — Encounter (HOSPITAL_BASED_OUTPATIENT_CLINIC_OR_DEPARTMENT_OTHER): Payer: Medicaid - Dental | Admitting: Oncology

## 2011-05-12 ENCOUNTER — Other Ambulatory Visit: Payer: Self-pay | Admitting: Oncology

## 2011-05-12 DIAGNOSIS — I82409 Acute embolism and thrombosis of unspecified deep veins of unspecified lower extremity: Secondary | ICD-10-CM

## 2011-05-12 DIAGNOSIS — D392 Neoplasm of uncertain behavior of placenta: Secondary | ICD-10-CM

## 2011-05-12 DIAGNOSIS — Z452 Encounter for adjustment and management of vascular access device: Secondary | ICD-10-CM

## 2011-05-12 LAB — CBC WITH DIFFERENTIAL/PLATELET
BASO%: 0.9 % (ref 0.0–2.0)
Basophils Absolute: 0 10*3/uL (ref 0.0–0.1)
EOS%: 1.7 % (ref 0.0–7.0)
HCT: 30.1 % — ABNORMAL LOW (ref 34.8–46.6)
HGB: 9.6 g/dL — ABNORMAL LOW (ref 11.6–15.9)
MCH: 23.2 pg — ABNORMAL LOW (ref 25.1–34.0)
MCHC: 31.9 g/dL (ref 31.5–36.0)
MCV: 72.9 fL — ABNORMAL LOW (ref 79.5–101.0)
MONO%: 11.2 % (ref 0.0–14.0)
NEUT%: 61.7 % (ref 38.4–76.8)
lymph#: 1.1 10*3/uL (ref 0.9–3.3)

## 2011-05-12 LAB — COMPREHENSIVE METABOLIC PANEL
ALT: 18 U/L (ref 0–35)
AST: 16 U/L (ref 0–37)
Alkaline Phosphatase: 35 U/L — ABNORMAL LOW (ref 39–117)
BUN: 9 mg/dL (ref 6–23)
Chloride: 108 mEq/L (ref 96–112)
Creatinine, Ser: 0.85 mg/dL (ref 0.50–1.10)
Total Bilirubin: 0.2 mg/dL — ABNORMAL LOW (ref 0.3–1.2)

## 2011-05-12 LAB — PROTIME-INR: INR: 1 — ABNORMAL LOW (ref 2.00–3.50)

## 2011-05-13 ENCOUNTER — Encounter (HOSPITAL_BASED_OUTPATIENT_CLINIC_OR_DEPARTMENT_OTHER): Payer: BC Managed Care – PPO | Admitting: Oncology

## 2011-05-13 ENCOUNTER — Ambulatory Visit (HOSPITAL_COMMUNITY)
Admission: RE | Admit: 2011-05-13 | Discharge: 2011-05-13 | Disposition: A | Discharge status: Home / Self Care | Payer: BC Managed Care – PPO | Source: Ambulatory Visit | Attending: Oncology | Admitting: Oncology

## 2011-05-13 ENCOUNTER — Other Ambulatory Visit: Payer: Self-pay | Admitting: Oncology

## 2011-05-13 DIAGNOSIS — O019 Hydatidiform mole, unspecified: Secondary | ICD-10-CM

## 2011-05-13 DIAGNOSIS — I82629 Acute embolism and thrombosis of deep veins of unspecified upper extremity: Secondary | ICD-10-CM

## 2011-05-13 DIAGNOSIS — C58 Malignant neoplasm of placenta: Secondary | ICD-10-CM | POA: Insufficient documentation

## 2011-05-14 ENCOUNTER — Inpatient Hospital Stay (HOSPITAL_COMMUNITY)
Admission: AD | Admit: 2011-05-14 | Discharge: 2011-05-16 | DRG: 410 | Disposition: A | Discharge status: Home / Self Care | Payer: BC Managed Care – PPO | Source: Ambulatory Visit | Attending: Oncology | Admitting: Oncology

## 2011-05-14 ENCOUNTER — Inpatient Hospital Stay (HOSPITAL_COMMUNITY): Payer: BC Managed Care – PPO

## 2011-05-14 DIAGNOSIS — I82409 Acute embolism and thrombosis of unspecified deep veins of unspecified lower extremity: Secondary | ICD-10-CM

## 2011-05-14 DIAGNOSIS — I10 Essential (primary) hypertension: Secondary | ICD-10-CM | POA: Diagnosis present

## 2011-05-14 DIAGNOSIS — T451X5A Adverse effect of antineoplastic and immunosuppressive drugs, initial encounter: Secondary | ICD-10-CM | POA: Diagnosis present

## 2011-05-14 DIAGNOSIS — D6481 Anemia due to antineoplastic chemotherapy: Secondary | ICD-10-CM | POA: Diagnosis present

## 2011-05-14 DIAGNOSIS — D390 Neoplasm of uncertain behavior of uterus: Secondary | ICD-10-CM

## 2011-05-14 DIAGNOSIS — D509 Iron deficiency anemia, unspecified: Secondary | ICD-10-CM | POA: Diagnosis present

## 2011-05-14 DIAGNOSIS — C58 Malignant neoplasm of placenta: Secondary | ICD-10-CM | POA: Diagnosis present

## 2011-05-14 DIAGNOSIS — Z5111 Encounter for antineoplastic chemotherapy: Principal | ICD-10-CM

## 2011-05-14 DIAGNOSIS — Z7901 Long term (current) use of anticoagulants: Secondary | ICD-10-CM

## 2011-05-14 DIAGNOSIS — I82890 Acute embolism and thrombosis of other specified veins: Secondary | ICD-10-CM | POA: Diagnosis present

## 2011-05-14 DIAGNOSIS — D649 Anemia, unspecified: Secondary | ICD-10-CM

## 2011-05-14 LAB — URINALYSIS, ROUTINE W REFLEX MICROSCOPIC
Bilirubin Urine: NEGATIVE
Glucose, UA: NEGATIVE mg/dL
Ketones, ur: NEGATIVE mg/dL
Protein, ur: NEGATIVE mg/dL
Urobilinogen, UA: 0.2 mg/dL (ref 0.0–1.0)

## 2011-05-14 LAB — URINE MICROSCOPIC-ADD ON

## 2011-05-15 DIAGNOSIS — K08409 Partial loss of teeth, unspecified cause, unspecified class: Secondary | ICD-10-CM

## 2011-05-15 DIAGNOSIS — K08109 Complete loss of teeth, unspecified cause, unspecified class: Secondary | ICD-10-CM

## 2011-05-15 LAB — CBC
Hemoglobin: 9.9 g/dL — ABNORMAL LOW (ref 12.0–15.0)
MCH: 22.7 pg — ABNORMAL LOW (ref 26.0–34.0)
Platelets: 228 10*3/uL (ref 150–400)
RBC: 4.36 MIL/uL (ref 3.87–5.11)

## 2011-05-15 LAB — URINALYSIS, ROUTINE W REFLEX MICROSCOPIC
Bilirubin Urine: NEGATIVE
Leukocytes, UA: NEGATIVE
Nitrite: NEGATIVE
Specific Gravity, Urine: 1.005 (ref 1.005–1.030)
Urobilinogen, UA: 0.2 mg/dL (ref 0.0–1.0)
pH: 8 (ref 5.0–8.0)

## 2011-05-15 LAB — PROTIME-INR
INR: 1.07 (ref 0.00–1.49)
Prothrombin Time: 14.1 seconds (ref 11.6–15.2)

## 2011-05-16 LAB — BASIC METABOLIC PANEL
BUN: 9 mg/dL (ref 6–23)
Chloride: 101 mEq/L (ref 96–112)
Creatinine, Ser: 0.68 mg/dL (ref 0.50–1.10)
GFR calc Af Amer: >60 mL/min (ref >60)
GFR calc non Af Amer: >60 mL/min (ref >60)
Potassium: 3.8 mEq/L (ref 3.5–5.1)

## 2011-05-16 LAB — PROTIME-INR: Prothrombin Time: 17.9 seconds — ABNORMAL HIGH (ref 11.6–15.2)

## 2011-05-16 NOTE — Discharge Summary (Signed)
[name], [name]               ACCOUNT NO.:  [id number]  MEDICAL RECORD NO.:  [id number]  LOCATION:  1310                         FACILITY:  [facility]  PHYSICIAN:  Reece Packer, M.D. 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  For details of presentation and course to this point, see the dictated H and P.  [name], past medical history, family history and social history is in the dictated H and P.  ADMISSION PHYSICAL EXAM:  VITAL SIGNS:  Weight 295 pounds, height 70 inches, temperature 98.3, heart rate 115 and regular, respirations 18, blood pressure 136/86, 97% saturated on room air. GENERAL:  Awake, alert and looks comfortable in bed.  Alopecia. HEENT:  Pupils equal and reactive, not icteric.  Mouth clear and moist with missing front teeth. LUNGS:  Clear. HEART:  Regular rate and rhythm without gallop. PICC site, left upper extremity, not tender.  Right upper arm has prominent superficial veins proximally though range of motion is improved.  No heat or tenderness there and no apparent swelling (with DVT in the right axilla documented last week).  Lower extremities, no edema or cords.  She has small ecchymoses on her upper arms related to the PICC and the dressings there. ABDOMEN:  Obese, soft, positive bowel sounds, nontender.  No appreciable organomegaly. NEUROLOGIC:  Nonfocal and she is able to change position easily in bed without assistance.  LABORATORY DATA:  Laboratories at the [facility] on May 12, 2011; INR 1.0.  Hemoglobin 9.6, white count 4.5, ANC of 2.8, platelets 240, MCV 72.9.  Beta HCG was down to 14.  Sodium 140, potassium 3.7, chloride of 108, CO2 of 21, glucose 116, BUN 9,  creatinine 0.8, total bili 0.2, alk phos 35, GOT 16, GPT 18, total protein 6.8, albumin 3.5, calcium 8.8.  COURSE IN HOSPITAL:  The patient was admitted to the Oncology Unit and begun on IV hydration with bicarb initially 250 cc an hour x1 L, then 150 cc an hour.  Chemotherapy was begun on the day of admission with etoposide 100 mg/sq m equals 200 mg IV over 60 minutes day 1 and day 2; dactinomycin 0.5 mg IV bolus on day 1 and day 2; methotrexate 100 mg/sq m equals 200 mg IV bolus day 1 and methotrexate 200 mg/sq m equals 400 mg IV continuous infusion over 12 hours.  Leucovorin was begun at 30 mg p.o. q.12 h starting 24 hours after the methotrexate bolus.  Urine pH were monitored and remained in good range for the methotrexate.  She used oral ice around the methotrexate to prevent mucositis.  The patient was continued on full dose Lovenox 1.5 mg/kg daily as we continued to transition to Coumadin.  Coumadin was dosed at 10 mg on May 14, 2011, and 12.5 mg on May 15, 2011.  The patient will receive the Lovenox prior to discharge on May 16, 2011.  She had no significant bleeding complications in the  hospital.  INR on day of discharge was 1.45.  She had progressive improvement in the previous discomfort right axillary region, with good range of motion in that shoulder.  The new PICC line initially did not draw blood, however, functioned well without other interventions when this was tried a little later in the hospitalization.  The patient's hypertension was well controlled with the Norvasc.  She was not symptomatic enough from the mild anemia to require transfusion.  She actually slept very well using Restoril 30 mg at bedtime.  Bowels continued to move regularly with the MiraLax.  She was seen in followup by Dr. Kristin Bruins of Dental Medicine with the dental bridge completed.  DISCHARGE LABORATORY DATA:  On day of discharge, INR 1.45.  Sodium 137, potassium 3.8, chloride 101, CO2 of 30,  glucose 196, BUN 9, creatinine 0.6.  DISCHARGE MEDICATIONS: 1. Protonix 40 mg daily. 2. Norvasc 10 mg daily. 3. Senokot S 2 tablets twice daily p.r.n. 4. Coumadin will be dosed at 10 mg on May 16, 2011; May 17, 2011;     and May 18, 2011; and we will repeat PT/INR at the [facility]     on May 19, 2011, with dosing from there. 5. Lovenox will be given at the 200 mg dose on May 16, 2011, prior to     discharge and last anticipated injection at [facility] on May 17, 2011. 6. She will continue leucovorin 30 mg q.12 h through evening of May 17, 2011. 7. MiraLax b.i.d. 8. Ferrous fumarate 324 mg daily. 9. Restoril 30 mg q.h.s. p.r.n. 10.Zofran 8 mg to 16 mg q.12 h p.r.n. 11.Lorazepam 1 mg q.4 h p.r.n. 12.Hycodan syrup 5 cc q.6 h p.r.n.  No Ambien.  Follow-up will be Lovenox injection at the [facility] on May 17, 2011.  Labs plus PICC flush at the [facility] and May 19, 2011, with Coumadin dosing from there; day 8 cycle 4 chemotherapy on May 21, 2011, at the [facility] and PICC flush plus PT/INR on May 23, 2011, at the [facility]  She will also be seen by physician assistant at the [facility] July 23.FINAL DIAGNOSES: 1. High-risk metastatic gestational trophoblastic disease, continuing     chemotherapy as recommended by GYN Oncology, now having had day 1     and day 2 cycle 4 EMA-CO.  Last beta HCG was down to 14 just prior     to start of cycle 4. 2. Right axillary deep vein thrombosis related to previous PICC line,     on Lovenox transitioning to Coumadin as above. 3. New left upper extremity PICC line which has been functioning well. 4. Hypertension, presently well controlled with the Norvasc. 5. Anemia secondary to GYN bleeding, iron deficiency and chemotherapy,     now back on oral iron, not symptomatic enough for packed red cells.  Not mentioned above, chest x-ray on admission showed no effusions and pulmonary nodules could not be  identified (these seen on CT but not chest x-ray at initial staging).  CONDITION ON DISCHARGE:  Condition on discharge is good.  DIET:  As tolerated.  ACTIVITY:  As tolerated.     Reece Packer, M.D.     LPL/MEDQ  D:  05/16/2011  T:  05/16/2011  Job:  [id number]  cc:   Laurette Schimke, MD  Lelon Perla, DO [address] [place], [place] [address]  Electronically Signed by Jama Flavors M.D. on 05/16/2011  02:46:57 PM

## 2011-05-16 NOTE — Discharge Summary (Signed)
[name], [name]               ACCOUNT NO.:  [id number]  MEDICAL RECORD NO.:  [id number]  LOCATION:  1301                         FACILITY:  [facility]  PHYSICIAN:  Reece Packer, M.D. DATE OF BIRTH:  04/29/1971  DATE OF ADMISSION:  04/16/2011 DATE OF DISCHARGE:  04/18/2011                              DISCHARGE SUMMARY   HISTORY OF PRESENT ILLNESS:  The patient is a 40 year old lady with high risk metastatic gestational trophoblastic disease, admitted for days 1 and 2 cycle #2 EMA-CO chemotherapy.  The start of this second cycle of chemotherapy was delayed x1 week because of low counts.  For details of the patient's presentation and course to this point, please see the dictated H and P.  [name], past medical history, family history and social history as in the dictated H and P.  PHYSICAL EXAMINATION:  VITAL SIGNS:  Height 70 inches, weight 307 pounds, blood pressure 126/85, temperature 98.4, heart rate 85 and regular, respirations 20 and not labored, 97% saturated on room air. HEENT:  Partial alopecia.  Oral mucosa moist and clear.  No irritation around the front incisor where she has lost the crown. LUNGS:  Have some coarse breath sounds bilaterally especially in the lower field without wheezes.  Clear to percussion.  Frequent dry cough through all the exam.  No use of accessory muscles.  PICC site not remarkable.  No swelling.  Right upper extremity, no tenderness there. Good range of motion of the right shoulder. HEART:  Regular rate and rhythm.  No gallop. BREASTS:  Breast exam not repeated this admission. ABDOMEN:  Obese, soft, nontender including the epigastrium.  Some bowel sounds present and normally active.  No inguinal adenopathy. EXTREMITIES:  Lower extremities without edema or cords. NEUROLOGIC:  Cranial nerves, motor, sensory are all Cranial Nerves: Motor sensory all without focal neurologic deficits.  Speech is fluent. SKIN:  Not  remarkable. MUSCULOSKELETAL:  Otherwise not remarkable.  LABORATORY DATA:  Laboratories on admission; urinalysis with specific gravity 1.019, pH initially of 6.5, negative for ketones, moderate blood which is thought to be with the vaginal bleeding, negative for protein, trace leukocytes.  Additional full blood work was used from labs obtained at [facility] on April 14, 2011; white count 9.9, hemoglobin 11, platelets 252.  Sodium 137, potassium 4, chloride 103, CO2 of 24, glucose 133, BUN 8, creatinine 0.8, total bili 0.3, alk phos 46, GOT 20, GPT 20, total protein 7.6, albumin 3.9 and calcium 9.3.  Note, quantitative beta HCG on April 14, 2011, was 5324.  COURSE IN HOSPITAL:  The patient was admitted to the Oncology Unit. Chemotherapy was held briefly on admission in order to obtain chest x- ray due to the cough, this showing normal heart size and low lung volumes, no focal airspace disease, PICC line stable; other labs not remarkable.  Her lisinopril was held as it was thought likely resuming this medication was the cause of the cough.  We did use p.r.n. Hycodan during the hospitalization which was helpful for the cough.  Cough seemed to be progressively improving by discharge on April 18, 2011.  She remained afebrile through the hospital stay.  We also obtained a  CT head with and without contrast on the day of admission as the patient was complaining at that point of more severe headaches within the past week and had not had her head imaged with the metastatic GTD.  There was no evidence of any mass lesion or other suggestion of metastatic disease, no infarction and otherwise not remarkable.  The patient's chemotherapy was then begun also on the day of admission.  She received VP-16 100 mg/sq m equals 200 mg IV over 60 minutes day #1 and day #2, dactinomycin 0.5 mg IV day #1 and day #2, methotrexate 100 mg/sq m equals 200 mg IV bolus on day #1 and then methotrexate 200 mg/sq m equals  400 mg IV continuous infusion over 12 hours begun on day #1.  Leucovorin rescue is to be 30 mg q. 12 hr., first dose a.m. of April 18, 2011 and the patient to continue that at home on this scheduled for total of 4 additional doses.  IV fluid was D5W with 100 mEq of bicarb per liter at 250 an hour x4 hours initially, then 150 cc an hour.  We followed the urine pH with chemotherapy not begun until the pH was greater than 6.5 and remained in good range above that through the hospitalization.  She did use oral ice as much as possible p.o. through the methotrexate. Antiemetics were Zofran 16 mg IV plus Emend 150 mg IV plus Decadron 10 mg IV on day #1 and Zofran 16 mg IV plus Decadron 10 mg IV on day #2. Bowels moved daily in hospital.  She lost the new dental crown and has been back in touch with Dental Medicine about this.  As she had not begun birth control by GYN or GYN Oncology to this point, we did given Depo- Provera 150 mg x1 on April 17, 2011.  DISCHARGE MEDICATIONS: 1. Leucovorin 30 mg q.12 h x4 doses, begin 2200 on April 18, 2011. 2. Zofran 8 to 16 mg q.12 h p.r.n. for nausea. 3. She is not to take lisinopril. 4. She is to continue Norvasc 5 mg daily (blood pressures all good     through this hospitalization, 127/81 on the a.m. of discharge). 5. Hycodan 5 cc q.6 h p.r.n. 6. Ativan 1 mg q.4-6 h p.r.n. 7. Protonix 40 mg daily. 8. Senokot-S 2 b.i.d. p.r.n.  FOLLOWUP:  Followup will be at the [facility] on April 21, 2011, with PICC flush and labs, then day #8 cycle 2 at the [facility] on April 23, 2011.  I anticipate that we will readmit the following week for start of cycle 3.  IMPRESSION AND PLAN: 1. High risk metastatic gestational trophoblastic disease as above. 2. Birth control begun with Depo-Provera on April 17, 2011. 3. Percutaneous intravascular central catheter in place. 4. Hypertension, better on Norvasc.  We will hold lisinopril due to     cough. 5. Cough with  unremarkable chest x-ray and nothing that suggests     infection, suspicious that cause may be the lisinopril which was     resumed about 2 weeks ago. 6. Percutaneous intravascular central catheter line in place. 7. Dental problems being addressed.  DIET:  As tolerated.  ACTIVITY:  As tolerated, although she is medically unable to be working at this point.  Wound care for the patient is to be done through the Cancer Center.  CONDITION ON DISCHARGE:  Stable.     Reece Packer, M.D.     LPL/MEDQ  D:  04/18/2011  T:  04/18/2011  Job:  [id number]  cc:   Laurette Schimke, MD  Lelon Perla, DO [address] [place], [place] [address]  Naima A. Normand Sloop, M.D. Fax: [phone]  Electronically Signed by Jama Flavors M.D. on 05/16/2011 02:33:43 PM

## 2011-05-16 NOTE — Discharge Summary (Signed)
[name], [name]               ACCOUNT NO.:  [id number]  MEDICAL RECORD NO.:  [id number]  LOCATION:  1342                         FACILITY:  [facility]  PHYSICIAN:  Reece Packer, M.D. 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  For details of her presentation and course to this point, see the dictated H and P note.  Beta HCG was 165 by labs done in April 28, 2011.  [name], past medical history, family history and social history is in the dictated H and P.  PHYSICAL EXAM:  VITAL SIGNS:  On admission, weight 295 pounds, height 70 inches, temperature 98.5, blood pressure 122/82, heart rate 102, respirations 18 and 98% saturated on room air. HEENT:  Near complete alopecia.  Pupils equal and reactive, nonicteric. Mouth moist and clear.  No JVD.  No supraclavicular or cervical adenopathy.  PICC site is not remarkable and flushes easily. LUNGS:  Clear anteriorly and posteriorly. HEART:  Heart has regular rate and rhythm without gallop. ABDOMEN:  Soft and nontender, obese, positive bowel sounds.  No obvious organomegaly or masses. EXTREMITIES:  Lower extremities without pitting edema, cords or tenderness. NEUROLOGIC:  Nonfocal. SKIN:  Without petechiae or bruises.  LABORATORY DATA:  On admission white count 9500, ANC 5.7, hemoglobin 10, platelets 142.  Sodium 137, potassium 3.6, chloride 105, CO2 of 23, glucose 143, BUN 11, creatinine 0.7, total bili 0.1, alk phos 46, GOT 14, GPT 14, total protein 7, albumin 3.2, calcium 8.9.  Urinalysis initially had a pH of 5, negative for leukocytes, 0 to 2 red cells. Repeat urine culture was sent due to recent UTI, that resulting negative.  The patient also had chest  x-ray on admission which showed lungs to be clear (pulmonary nodules seen on initial evaluation were identified only on CT scan, not seen on chest x-ray at that point either).  No effusion. PICC line in place and otherwise no cardiopulmonary process on that chest x-ray.  The patient was admitted and begun on IV fluids containing bicarb with urine pH monitored until this was greater than 6.5, then followed every shift for the methotrexate.  She was treated with premeds of Zofran 16 mg IV, Emend 150 mg IV, Decadron 10 mg IV day 1 and the Zofran 16 mg 24 hours from the first dose plus Decadron 10 mg IV in a.m. of day 2.  IV fluid was D5W with 100 mEq of bicarb per liter at 250 cc an hour x1 L, then 150 cc an hour.  She also used ice and popsicles around the methotrexate bolus and through the infusion as much as possible. Chemotherapy was etoposide 100 mg/sq m equals 200 mg IV day 1 and day 2, dactinomycin 0.5 mg on day 1 and day 2, methotrexate 100 mg/sq m equals 200 mg IV bolus on day 1 followed by methotrexate 200 mg/sq m equals 400 mg IV continuous infusion over 12 hours beginning  day 1.  She had leucovorin rescue started in hospital 30 mg q.12 h.  We will continue the leucovorin at 30 mg q.12 h through evening dose of May 03, 2011.  The patient tolerated all of the chemotherapy well this admission, with no significant nausea, no problems with the PICC line, no bleeding (on prophylactic Lovenox), no mucositis symptoms.  She was taking food and fluids adequately and bowels had moved by the day of discharge.  She was seen in hospital by the chaplain.  FINAL DIAGNOSIS: 1. High risk metastatic gestational trophoblastic disease:  Continuing     chemotherapy on schedule with EMA-CO.  Next treatment will be May 07, 2011, as an outpatient and the cancer center. 2. PICC line in place by IR 3. Hypertension with blood pressures adequately controlled on the     Norvasc at 10 mg daily. 4.  Anemia which is iron deficiency related to the GYN blood loss plus     chemotherapy related, the patient to resume p.o. iron at discharge. 5. Urinary tract infection, resolved.  DIET:  As tolerated.  ACTIVITY:  As tolerated.  Care the PICC as instructed (this flushed at the [facility] three times weekly).  Follow-up will be at the [facility] with PICC flush on May 05, 2011; then lab plus MD visit on May 07, 2011.  CONDITION ON DISCHARGE:  Stable.  DISCHARGE MEDICATIONS: 1. Leucovorin 30 mg every 12 hours through May 03, 2011 evening. 2. MiraLax 1 capsule twice daily as needed. 3. Ambien 10 mg at bedtime p.r.n. 4. Norvasc 10 mg daily. 5. Hycodan syrup q.6 h p.r.n. for cough. 6. Iron 1 tablet daily. 7. Ativan 1 mg q.6 h p.r.n. for nausea or to relax at bedtime. 8. Zofran 8 to 16 mg q.12 h p.r.n. for nausea. 9. Protonix 40 mg daily. 10.Senokot-S 2 tablets twice daily as needed.     Reece Packer, M.D.     LPL/MEDQ  D:  05/02/2011  T:  05/02/2011  Job:  [id number]  cc:   Laurette Schimke, MD  Naima A. Normand Sloop, M.D. Fax: [phone]  Lelon Perla, DO [address] [place], [place] [address]  Electronically Signed by Jama Flavors M.D. on 05/16/2011 02:38:48 PM

## 2011-05-16 NOTE — H&P (Signed)
[name], CLOS NO.:  [id number]  MEDICAL RECORD NO.:  [id number]  LOCATION:  1310                         FACILITY:  [facility]  PHYSICIAN:  Reece Packer, M.D. 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  Day 8, cycle 3 was cancelled on May 07, 2011 due to DVT secondary to the PICC line and low-grade temperature then.  The PICC line was also removed on May 07, 2011, and the patient has been on full- dose Lovenox daily, with her first Coumadin 10 mg begun on May 13, 2011.  A new PICC was placed on July 17 for ongoing chemotherapy.  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  She had a negative chest x-ray then, and subsequently CT chest, abdomen and pelvis had bilateral pulmonary nodules and 11x11x9 cm uterine mass on May 24, this obtained due to rising marker.  She had 2 weeks of IM methotrexate without any significant drop in the marker.  She was seen by Dr. Nelly Rout on Mar 25, 2011 with beta HCG then 175,700.  She was referred to medical oncology and admitted for cycle 1 EMA-CO on Mar 26, 2011. The beta HCG has progressively improved, down to 165 just prior to cycle 3 and down to 14 on May 12, 2011 (prior to present cycle 4).  GYN Oncology has recommended 2 cycles beyond normalization of the marker. Note, head CT was negative for metastatic disease on April 16, 2011.  The major problem with her chemotherapy thus far has been the PICC- associated DVT as mentioned above.  Otherwise, severe nausea prior to start of treatment resolved rapidly with initiation of chemotherapy. Blood pressure control has been good with change in  her antihypertensives to the present Norvasc 10 mg daily, and the patient had first Depo-Provera given April 17, 2011.  There was no problem with replacement of the PICC in her left upper extremity by Interventional Radiology on May 13, 2011.  She has had a little bruising around the new PICC line but no overt bleeding.  The previous discomfort and limitation of motion in the right axilla has resolved, and she has had no further fever (temperature was 99-100 on the day that the DVT was found).  She still has occasional nonproductive minimal cough controlled with p.r.n. Hycodan.  Bowels move well with MiraLAX.  She has not had nausea.  She is still having difficulty sleeping after about 3 a.m. even with Ambien.  [name]:  Otherwise, no abdominal or pelvic pain.  No musculoskeletal pain.  No chest pain.  No cardiac symptoms.  No headache or other neurologic symptoms.  No new dental concerns, still waiting for replacement of the front cap.  No increased peripheral neuropathy. No dysuria or other sx UTI. Remainder of the full 10-point review of systems unchanged.  PAST HISTORY: 1. Allergy to SULFA causing itching and  LISINOPRIL causing cough. 2. She is gravida 4, para 2 with 1 neonatal demise and her daughter     aged 71. 3. C-sections x2. 4. Hypertension. 5. Iron deficiency anemia, and she has not been regularly on oral iron     since we started the chemotherapy, which we will try to improve. 6. HIV was negative in April 2012.  FAMILY HISTORY:  Mother with diabetes and hypertension.  Father with hypertension.  Daughter healthy.  SOCIAL HISTORY:  She is divorced.  Lives with her 50-year-old daughter. Mother has come from out of state to assist.  She has been in [place] for the past 7 years from [place].  No tobacco and only social alcohol.  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  PHYSICAL EXAMINATION:  VITAL SIGNS:  Weight from July  13, 295 pounds, height 70 inches, 2.46 sq m with cap per GYN Oncology at 2.0, temperature 98.3, heart rate 115 and regular, respirations 18, blood pressure 136/86, 97% saturated on room air. GENERAL:  She is awake, alert and looks comfortable in bed. HEENT:  Alopecia.  Pupils equal and reactive.  Mouth clear and moist. LUNGS:  Clear. HEART:  Regular rate and rhythm. EXTREMITIES:  PICC site in left upper arm okay, not tender.  Able to raise her right upper extremity easily now.  Some more prominent superficial veins proximally.  No heat or tenderness.  Lower extremities, no edema or chords.  She has some small ecchymoses on her upper arms related to the PICCs and the dressings there.  Moves easily in bed. ABDOMEN:  Obese, soft.  Positive bowel sounds.  Not tender.  No appreciable organomegaly. NEUROLOGIC:  Nonfocal.  LABORATORY DATA:  Labs done at the cancer center on May 12, 2011, INR of 1.0, hemoglobin 9.6, white count 4.5, ANC 2.8, platelets 240, MCV 72.9.  Beta HCG was 14.  Sodium 140, potassium 3.7, chloride 108, CO2 21, glucose 116, BUN 9, creatinine 0.8.  Total bili 0.2, alk phos 35, GOT 16, GPT 18, total protein 6.8, albumin 3.5, calcium 8.8.  IMPRESSION AND PLAN: 1. High-risk metastatic gestational trophoblastic disease:  Admit for     day 1 and day 2 of cycle 4 EMA-CO.  Good response of the beta HCG     though this is not quite to normal yet. 2. Right upper extremity deep venous thrombosis related to the PICC:     We will continue Lovenox until Coumadin is therapeutic, following     PT-INR daily beginning tomorrow while in hospital. 3. PICC line placed in the left upper extremity yesterday by IR. 4. Iron deficiency and chemotherapy associated anemia:  P.o. iron and     follow CBCs. 5. Hypertension with better control on Norvasc. 6. Difficulty sleeping.  We will try Restoril instead of the Ambien.     Reece Packer, M.D.     LPL/MEDQ  D:  05/14/2011  T:   05/14/2011  Job:  [id number]  cc:   Laurette Schimke, MD  Lelon Perla, DO [address] [place], [place] [address]  Naima A. Normand Sloop, M.D. Fax: [phone]  Electronically Signed by Jama Flavors M.D. on 05/16/2011 02:44:02 PM

## 2011-05-16 NOTE — H&P (Signed)
NAMEBRITTANIA, [name] NO.:  [id number]  MEDICAL RECORD NO.:  [id number]  LOCATION:  DMP                          FACILITY:  [facility]  PHYSICIAN:  Reece Packer, M.D. 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  Beta hCG prior to the D and C was 292,220.  She received IM methotrexate on May 17th for beta hCG rising then to 257, 443 and IM methotrexate again on Mar 20, 2011, with beta HCG that day 229,530.  Chest x-ray on May 17th was not remarkable, however, CT chest, abdomen, pelvis on May 24th had bilateral small pulmonary nodules and 11 x 11 x 9 cm uterine mass.  She was seen by Dr. Laurette Schimke on Mar 25, 2011, with recommendation for EMA-CO chemotherapy due to high risk assessment.  She was treated with cycle 1, day #1 on Mar 26, 2011, (day #1 and day #2) give inpatient due to the infusional chemotherapy, after PICC placement.  She had day #8 outpatient on April 02, 2011.  Beta hCG on Mar 25, 2011, was 161,096 and on April 02, 2011, 045,409.  By April 07, 2011, the beta hCG was down to 41,636 and on June 18th down to 5324.  Cycle #2 was delayed a week due to low ANC, with counts having recovered adequately by June 18th.  Labs on June 18th had white count 9.9, hemoglobin 11, platelets 252.  Sodium 137, potassium 4, chloride 103, CO2 of 24, glucose 133, BUN 8, creatinine 0.8, total bili 0.3, alk phos 46, GOT 20, GPT 20, total protein 7.6, albumin 3.9, calcium 9.3.  [name]:  She had much more severe headache the last week, which resolved since she was started on an additional  blood pressure medication by Dr. Laury Axon (Norvasc begun on April 10, 2011).  She has had no other neurologic symptoms and no subsequent headache, only some very slight numbness in the tips of her thumbs bilaterally.  She has had a very bothersome nonproductive cough for the past week, not associated with shortness of breath, no fever, no one ill at home, and no chest pain.  She has had very minimal vaginal bleeding and no other bleeding. Bowels have been moving daily.  She has not had any nausea, last antiemetics 2 days ago, though she was not actually nauseated when she took that.  P.o. intake has been good.  No GERD symptoms.  The PICC site is no longer uncomfortable and has been functioning well.  She is not yet on birth control.  She has temporary upper incisor cap by Dr. Kristin Bruins, without discomfort there.  FAMILY HISTORY:  Mother with diabetes and hypertension.  Father with hypertension.  Her daughter, age 47, is healthy.  PAST MEDICAL HISTORY: 1. 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 2. [name]  4, para 2 with 1 neonatal demise. 3. 2 C-sections. 4. Hypertension since her first pregnancy, on lisinopril until     February 2012 when this was changed to methyldopa     because of the presumed pregnancy.  We stopped methyldopa and     resumed to lisinopril with start of her chemotherapy on Mar 26, 2011, as blood pressures were quite elevated then, and Norvasc was     added as above on April 10, 2011.  PICC line in place by     Interventional Radiology, HIV negative.  SOCIAL HISTORY:  She has lived in [place] for the past 7 years, originally from [place].  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  She is divorced, no tobacco.  Social alcohol.  No transfusions.  PHYSICAL EXAMINATION:  VITAL SIGNS:  Height 70 inches, weight on June 6th was just over 307 pounds.  Blood pressure today the best that I  have seen it at 126/85, temperature 98.4, heart rate 85 and regular, respirations 20 and not labored, 97% saturated on room air. HEENT:  She has partial alopecia.  Oral mucosa moist and clear.  No irritation around the front incisor. LUNGS:  Coarse breath sounds bilaterally especially in the lower fields. No wheezes.  Clear to percussion.  She has a frequent dry cough through all the exam, no use of accessory muscles.  The PICC site is not remarkable.  No swelling in the right upper extremity and good range of motion there now.  HEART:  Regular rate and rhythm without gallop. Abdomen: Obese, soft, nontender including the epigastrium.  Some bowel sounds present and normally active. LOWER EXTREMITIES:  No edema or cords. NEURO:  She has no focal neurologic changes. SKIN:  Not remarkable.  LABORATORY DATA:  As above with UA pending.  IMPRESSION/PLAN: 1. High-risk metastatic gestational trophoblastic disease:  Admit for     cycle 2, days #1 and #2, EMA- CO(VP16, dactinomycin, methotrexate     bolus plus infusion and leucovorin). 2. New nonproductive cough, unclear etiology.  We will get chest x-     ray, wonder if this might be related to resuming the lisinopril. 3. 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 4. PICC in place. 5. Hypertension.  Blood pressure much better today.  I would discuss     with the primary physician if it appears from the chest x-ray that     lisinopril could be the etiology. 6. Recent loss of a dental crown with Dr. Kristin Bruins following.     Reece Packer, M.D.     LPL/MEDQ  D:  04/16/2011  T:  04/17/2011  Job:  [id number]  cc:   Laurette Schimke, MD  Lelon Perla, DO [address] [place], [place] [address]  Naima A. Normand Sloop, M.D. Fax:  [phone]  Electronically Signed by Jama Flavors M.D. on 05/16/2011 02:37:28 PM

## 2011-05-16 NOTE — H&P (Signed)
[name], [name] NO.:  [id number]  MEDICAL RECORD NO.:  [id number]  LOCATION:  1342                         FACILITY:  [facility]  PHYSICIAN:  Reece Packer, M.D. 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  In order to keep chemotherapy on schedule, she received Neupogen x3 days following her day 8 cycle 2 chemotherapy with labs at our office on April 28, 2011, white count 5.3, ANC 2.7, hemoglobin 10.5 and platelets 109.  Beta HCG was down to 165 by labs, April 28, 2011.  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  Beta HCG prior to the D and C 292,220.  She had IM methotrexate on Mar 13, 2011, with beta HCG then 257,000 and IM methotrexate again on Mar 20, 2011, with beta HCG 229,000.  She was seen by Dr. Laurette Schimke on Mar 25, 2011, with CT chest, abdomen, pelvis on Mar 23, 2011, having shown bilateral small pulmonary nodules and uterus with 11 x 11 x 9 cm mass.  She was urgently admitted with chemotherapy begun via a PICC line on Mar 26, 2011  (day one cycle one).  As her blood pressures were not controlled on methyldopa, this was changed back to lisinopril which had been her usual medication prior to the "pregnancy."  She developed cough which was thought possibly related to the lisinopril and blood pressures still were not controlled with Norvasc added and the lisinopril discontinued. The cough has nearly resolved.  Head CT on April 16, 2011, due to headaches showed no evidence of metastatic disease and chest x-ray on April 16, 2011, did not have apparent pulmonary mets (these had not been visualized on initial chest x-ray either) and no infiltrate  or effusion. Beta HCG has been progressively dropping, down to 5324 on June 18 and 3046 on April 19, 2011.  Her cycle 2 day 1 was April 16, 2011.  On April 19, 2011 she was seen at [facility] by Dr. Hal Morales with severe abdominal pain and cramping, passing tissue from the vagina.  Labs in the emergency department on April 19, 2011, had the beta HCG as above at 3000, hemoglobin 10.9, white count 11.7, platelets 233 and chemistries as noted.  Transabdominal and transvaginal ultrasound then showed a 3.9 cm endometrium with enumerable cystic spaces, no internal hypervascularity or measurable focal abnormality and no adnexal masses.  She had a temperature of 99 degrees on April 23, 2011, which was day 8 cycle 2, though no pelvic discomfort and no bleeding then.  A urine culture had 50,000 Klebsiella resistant to ampicillin but otherwise sensitive and she was treated with 3 days of Cipro beginning April 25, 2011.  She did receive her day 8 cycle 2 treatment on April 23, 2011, and Neupogen June 28, June 29, and April 26, 2011.  She was seen back by GYN Oncology  with a low grade temperature.  [name]:  Dental medicine is assisting replacing her front crown.  She has some minimal numbness in the tips of her thumbs and index fingers bilaterally felt related to the vincristine, not interfering with activity.  She has some very minimal occasional cough which is nonproductive.  No shortness of breath now and she is still using occasional Hycodan syrup this.  She has not had any further bleeding or abdominal pelvic cramping.  No nausea and p.o. intake has been good.  She has some slight discomfort in the upper right arm as she has had related to the PICC, however, PICC line itself has not caused other problems.  No fever or symptoms of infection.  Bowels have been moving adequately.  She has been waking about 3 a.m. each morning and unable to go back to sleep, very tired  related to this.  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  She is gravida 4, para 2 with one neonatal demise and her one daughter living age 37.  She has had two C-sections, hypertension as above, PICC line placed by IR prior to beginning this chemotherapy, Depo-Provera given April 17, 2011.  She had local iron stores in early June 2012 related to the vaginal bleeding.  HIV was negative in April 2012.  FAMILY HISTORY:  Mother with diabetes and hypertension.  Father with hypertension.  Her daughter is healthy.  She does not know about any medical problems in her brother.  SOCIAL HISTORY:  She is originally from [place] and has lived in [place] for 7 years.  She is divorced.  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  No tobacco.  Social alcohol.  No transfusions. Mother has been staying with her to help with the 50-year-old.  PHYSICAL EXAMINATION:  VITAL SIGNS:  On exam, last weight on April 23, 2011, of 295 pounds which is down from 308 in early June, respirations unlabored on room air.  Rest of the vitals per EMR. HEENT:  Near complete alopecia.  Pupils equally and reactive, nonicteric.  Mouth is moist and clear.  No JVD.  No supraclavicular adenopathy.  PICC site not remarkable, flushes easily. LUNGS:  Clear anteriorly and posteriorly. HEART:  Regular rate and rhythm. ABDOMEN:  Soft, nontender, obese, positive bowel sounds.  No obvious hepatosplenomegaly. EXTREMITIES:  Lower Extremities:  No edema or cords. NEUROLOGIC:  Nonfocal. SKIN:  Skin has no bruises or petechiae.  LABORATORY DATA:  Labs today, CBC and urinalysis and urine culture are all pending.  Sodium 137, potassium 3.6, chloride 105, CO2 23, glucose 143, BUN 11, creatinine 0.7, total bili 0.1, alk phos 46, GOT 14, GPT 14, total protein 7, albumin 3.2, calcium 8.9.  Labs from  April 28, 2011, white count 5.3, ANC 2.7, hemoglobin 10.5, platelets 109.  Beta HCG 165. Sodium 137, potassium 3.9, chloride 105, CO2 22, glucose 142, BUN 11, creatinine 0.79, total bili 0.2, alk phos 49, GOT 16, GPT 15, total protein 6.9, albumin 3.7, calcium 8.6.  IMPRESSION AND PLAN: 1. High-risk metastatic gestational trophoblastic disease:  Excellent     response to chemotherapy thus far with drop in beta HCG as above.     Proceed with day 1 and day 2 cycle 3 chemotherapy this admission. 2. PICC in place. 3. Cough, resolving, thought related to the lisinopril versus possibly  also allergic 4. Hypertension, now on Norvasc. 5. Blood loss plus chemotherapy anemia.  CBC pending.  She has been     able to be back on oral iron since her nausea has improved. 6. Recent Klebsiella urinary tract infection.  Post Cipro as above.     Will repeat culture now.     Reece Packer, M.D.     LPL/MEDQ  D:  04/30/2011  T:  04/30/2011  Job:  [id number]  cc:   Laurette Schimke, MD  Naima A. Normand Sloop, M.D. Fax: [phone]  Lelon Perla, DO [address] [place], [place] [address]  Electronically Signed by Jama Flavors M.D. on 05/16/2011 02:42:10 PM

## 2011-05-17 ENCOUNTER — Encounter: Payer: BC Managed Care – PPO | Admitting: Oncology

## 2011-05-17 DIAGNOSIS — I82A19 Acute embolism and thrombosis of unspecified axillary vein: Secondary | ICD-10-CM

## 2011-05-19 ENCOUNTER — Encounter (HOSPITAL_BASED_OUTPATIENT_CLINIC_OR_DEPARTMENT_OTHER): Payer: BC Managed Care – PPO | Admitting: Oncology

## 2011-05-19 ENCOUNTER — Other Ambulatory Visit: Payer: Self-pay | Admitting: Oncology

## 2011-05-19 DIAGNOSIS — D392 Neoplasm of uncertain behavior of placenta: Secondary | ICD-10-CM

## 2011-05-19 DIAGNOSIS — Z452 Encounter for adjustment and management of vascular access device: Secondary | ICD-10-CM

## 2011-05-19 DIAGNOSIS — O019 Hydatidiform mole, unspecified: Secondary | ICD-10-CM

## 2011-05-19 LAB — COMPREHENSIVE METABOLIC PANEL
Albumin: 3.7 g/dL (ref 3.5–5.2)
Alkaline Phosphatase: 36 U/L — ABNORMAL LOW (ref 39–117)
BUN: 15 mg/dL (ref 6–23)
Creatinine, Ser: 0.86 mg/dL (ref 0.50–1.10)
Glucose, Bld: 164 mg/dL — ABNORMAL HIGH (ref 70–99)
Total Bilirubin: 0.3 mg/dL (ref 0.3–1.2)

## 2011-05-19 LAB — CBC WITH DIFFERENTIAL/PLATELET
BASO%: 0.5 % (ref 0.0–2.0)
EOS%: 1.9 % (ref 0.0–7.0)
LYMPH%: 32.6 % (ref 14.0–49.7)
MCHC: 32.3 g/dL (ref 31.5–36.0)
MCV: 71.9 fL — ABNORMAL LOW (ref 79.5–101.0)
MONO%: 0.5 % (ref 0.0–14.0)
Platelets: 226 10*3/uL (ref 145–400)
RBC: 4.28 10*6/uL (ref 3.70–5.45)
RDW: 15.8 % — ABNORMAL HIGH (ref 11.2–14.5)

## 2011-05-19 LAB — PROTIME-INR
INR: 2.2 (ref 2.00–3.50)
Protime: 26.4 Seconds — ABNORMAL HIGH (ref 10.6–13.4)

## 2011-05-21 ENCOUNTER — Other Ambulatory Visit: Payer: Self-pay | Admitting: Physician Assistant

## 2011-05-21 ENCOUNTER — Encounter (HOSPITAL_BASED_OUTPATIENT_CLINIC_OR_DEPARTMENT_OTHER): Payer: BC Managed Care – PPO | Admitting: Oncology

## 2011-05-21 DIAGNOSIS — D392 Neoplasm of uncertain behavior of placenta: Secondary | ICD-10-CM

## 2011-05-21 DIAGNOSIS — Z452 Encounter for adjustment and management of vascular access device: Secondary | ICD-10-CM

## 2011-05-21 DIAGNOSIS — Z5111 Encounter for antineoplastic chemotherapy: Secondary | ICD-10-CM

## 2011-05-21 LAB — PROTIME-INR
INR: 2.7 (ref 2.00–3.50)
Protime: 32.4 Seconds — ABNORMAL HIGH (ref 10.6–13.4)

## 2011-05-22 ENCOUNTER — Encounter (HOSPITAL_BASED_OUTPATIENT_CLINIC_OR_DEPARTMENT_OTHER): Payer: BC Managed Care – PPO | Admitting: Oncology

## 2011-05-22 DIAGNOSIS — D392 Neoplasm of uncertain behavior of placenta: Secondary | ICD-10-CM

## 2011-05-22 DIAGNOSIS — Z5189 Encounter for other specified aftercare: Secondary | ICD-10-CM

## 2011-05-23 ENCOUNTER — Encounter (HOSPITAL_BASED_OUTPATIENT_CLINIC_OR_DEPARTMENT_OTHER): Payer: BC Managed Care – PPO | Admitting: Oncology

## 2011-05-23 DIAGNOSIS — D392 Neoplasm of uncertain behavior of placenta: Secondary | ICD-10-CM

## 2011-05-23 DIAGNOSIS — IMO0002 Reserved for concepts with insufficient information to code with codable children: Secondary | ICD-10-CM

## 2011-05-24 ENCOUNTER — Encounter (HOSPITAL_BASED_OUTPATIENT_CLINIC_OR_DEPARTMENT_OTHER): Payer: BC Managed Care – PPO | Admitting: Oncology

## 2011-05-24 DIAGNOSIS — D392 Neoplasm of uncertain behavior of placenta: Secondary | ICD-10-CM

## 2011-05-24 DIAGNOSIS — Z5189 Encounter for other specified aftercare: Secondary | ICD-10-CM

## 2011-05-26 ENCOUNTER — Other Ambulatory Visit: Payer: Self-pay | Admitting: Oncology

## 2011-05-26 ENCOUNTER — Encounter (HOSPITAL_BASED_OUTPATIENT_CLINIC_OR_DEPARTMENT_OTHER): Payer: BC Managed Care – PPO | Admitting: Oncology

## 2011-05-26 DIAGNOSIS — D392 Neoplasm of uncertain behavior of placenta: Secondary | ICD-10-CM

## 2011-05-26 DIAGNOSIS — Z452 Encounter for adjustment and management of vascular access device: Secondary | ICD-10-CM

## 2011-05-26 LAB — CBC WITH DIFFERENTIAL/PLATELET
Eosinophils Absolute: 0.1 10*3/uL (ref 0.0–0.5)
MCV: 69.7 fL — ABNORMAL LOW (ref 79.5–101.0)
MONO%: 13.2 % (ref 0.0–14.0)
NEUT#: 3.3 10*3/uL (ref 1.5–6.5)
RBC: 4.22 10*6/uL (ref 3.70–5.45)
RDW: 16.5 % — ABNORMAL HIGH (ref 11.2–14.5)
WBC: 6.2 10*3/uL (ref 3.9–10.3)
lymph#: 1.9 10*3/uL (ref 0.9–3.3)
nRBC: 1 % — ABNORMAL HIGH (ref 0–0)

## 2011-05-26 LAB — COMPREHENSIVE METABOLIC PANEL
ALT: 17 U/L (ref 0–35)
Alkaline Phosphatase: 53 U/L (ref 39–117)
CO2: 21 mEq/L (ref 19–32)
Sodium: 138 mEq/L (ref 135–145)
Total Bilirubin: 0.2 mg/dL — ABNORMAL LOW (ref 0.3–1.2)
Total Protein: 6.9 g/dL (ref 6.0–8.3)

## 2011-05-27 ENCOUNTER — Encounter (HOSPITAL_BASED_OUTPATIENT_CLINIC_OR_DEPARTMENT_OTHER): Payer: BC Managed Care – PPO | Admitting: Oncology

## 2011-05-27 ENCOUNTER — Other Ambulatory Visit: Payer: Self-pay | Admitting: Oncology

## 2011-05-27 DIAGNOSIS — Z5181 Encounter for therapeutic drug level monitoring: Secondary | ICD-10-CM

## 2011-05-27 DIAGNOSIS — Z5111 Encounter for antineoplastic chemotherapy: Secondary | ICD-10-CM

## 2011-05-27 DIAGNOSIS — Z7901 Long term (current) use of anticoagulants: Secondary | ICD-10-CM

## 2011-05-28 ENCOUNTER — Inpatient Hospital Stay (HOSPITAL_COMMUNITY)
Admission: AD | Admit: 2011-05-28 | Discharge: 2011-05-30 | DRG: 410 | Disposition: A | Discharge status: Home / Self Care | Payer: BC Managed Care – PPO | Source: Ambulatory Visit | Attending: Oncology | Admitting: Oncology

## 2011-05-28 ENCOUNTER — Inpatient Hospital Stay (HOSPITAL_COMMUNITY): Payer: BC Managed Care – PPO

## 2011-05-28 DIAGNOSIS — T451X5A Adverse effect of antineoplastic and immunosuppressive drugs, initial encounter: Secondary | ICD-10-CM | POA: Diagnosis present

## 2011-05-28 DIAGNOSIS — T380X5A Adverse effect of glucocorticoids and synthetic analogues, initial encounter: Secondary | ICD-10-CM | POA: Diagnosis not present

## 2011-05-28 DIAGNOSIS — R209 Unspecified disturbances of skin sensation: Secondary | ICD-10-CM | POA: Diagnosis present

## 2011-05-28 DIAGNOSIS — Z86718 Personal history of other venous thrombosis and embolism: Secondary | ICD-10-CM

## 2011-05-28 DIAGNOSIS — R7309 Other abnormal glucose: Secondary | ICD-10-CM | POA: Diagnosis not present

## 2011-05-28 DIAGNOSIS — D6481 Anemia due to antineoplastic chemotherapy: Secondary | ICD-10-CM | POA: Diagnosis present

## 2011-05-28 DIAGNOSIS — C58 Malignant neoplasm of placenta: Secondary | ICD-10-CM | POA: Diagnosis present

## 2011-05-28 DIAGNOSIS — Z5111 Encounter for antineoplastic chemotherapy: Principal | ICD-10-CM

## 2011-05-28 DIAGNOSIS — I10 Essential (primary) hypertension: Secondary | ICD-10-CM | POA: Diagnosis present

## 2011-05-28 DIAGNOSIS — D392 Neoplasm of uncertain behavior of placenta: Secondary | ICD-10-CM

## 2011-05-28 DIAGNOSIS — D509 Iron deficiency anemia, unspecified: Secondary | ICD-10-CM | POA: Diagnosis present

## 2011-05-28 LAB — PROTIME-INR
INR: 3.19 — ABNORMAL HIGH (ref 0.00–1.49)
Prothrombin Time: 33.2 seconds — ABNORMAL HIGH (ref 11.6–15.2)

## 2011-05-28 LAB — URINALYSIS, DIPSTICK ONLY
Glucose, UA: NEGATIVE mg/dL
Leukocytes, UA: NEGATIVE
pH: 7.5 (ref 5.0–8.0)

## 2011-05-29 LAB — URINALYSIS, MICROSCOPIC ONLY
Bilirubin Urine: NEGATIVE
Glucose, UA: 250 mg/dL — AB
Hgb urine dipstick: NEGATIVE
Nitrite: NEGATIVE
Specific Gravity, Urine: 1.005 (ref 1.005–1.030)
pH: 7.5 (ref 5.0–8.0)

## 2011-05-29 LAB — BASIC METABOLIC PANEL
BUN: 6 mg/dL (ref 6–23)
CO2: 27 mEq/L (ref 19–32)
Calcium: 9.4 mg/dL (ref 8.4–10.5)
Creatinine, Ser: 0.67 mg/dL (ref 0.50–1.10)
Glucose, Bld: 242 mg/dL — ABNORMAL HIGH (ref 70–99)

## 2011-05-29 LAB — PROTIME-INR: INR: 2.46 — ABNORMAL HIGH (ref 0.00–1.49)

## 2011-06-02 ENCOUNTER — Other Ambulatory Visit: Payer: Self-pay | Admitting: Oncology

## 2011-06-02 ENCOUNTER — Encounter (HOSPITAL_BASED_OUTPATIENT_CLINIC_OR_DEPARTMENT_OTHER): Payer: BC Managed Care – PPO | Admitting: Oncology

## 2011-06-02 DIAGNOSIS — Z452 Encounter for adjustment and management of vascular access device: Secondary | ICD-10-CM

## 2011-06-02 DIAGNOSIS — D392 Neoplasm of uncertain behavior of placenta: Secondary | ICD-10-CM

## 2011-06-02 LAB — CBC WITH DIFFERENTIAL/PLATELET
BASO%: 0.2 % (ref 0.0–2.0)
LYMPH%: 32.9 % (ref 14.0–49.7)
MCHC: 32.6 g/dL (ref 31.5–36.0)
MCV: 71.6 fL — ABNORMAL LOW (ref 79.5–101.0)
MONO#: 0 10*3/uL — ABNORMAL LOW (ref 0.1–0.9)
MONO%: 0.7 % (ref 0.0–14.0)
Platelets: 201 10*3/uL (ref 145–400)
RBC: 3.84 10*6/uL (ref 3.70–5.45)
WBC: 4.6 10*3/uL (ref 3.9–10.3)

## 2011-06-03 LAB — COMPREHENSIVE METABOLIC PANEL
ALT: 18 U/L (ref 0–35)
AST: 11 U/L (ref 0–37)
Alkaline Phosphatase: 39 U/L (ref 39–117)
Sodium: 136 mEq/L (ref 135–145)
Total Bilirubin: 0.2 mg/dL — ABNORMAL LOW (ref 0.3–1.2)
Total Protein: 6.1 g/dL (ref 6.0–8.3)

## 2011-06-04 ENCOUNTER — Other Ambulatory Visit: Payer: Self-pay | Admitting: Oncology

## 2011-06-04 ENCOUNTER — Encounter (HOSPITAL_BASED_OUTPATIENT_CLINIC_OR_DEPARTMENT_OTHER): Payer: BC Managed Care – PPO | Admitting: Oncology

## 2011-06-04 DIAGNOSIS — Z5111 Encounter for antineoplastic chemotherapy: Secondary | ICD-10-CM

## 2011-06-04 DIAGNOSIS — Z452 Encounter for adjustment and management of vascular access device: Secondary | ICD-10-CM

## 2011-06-04 DIAGNOSIS — I10 Essential (primary) hypertension: Secondary | ICD-10-CM

## 2011-06-04 DIAGNOSIS — D649 Anemia, unspecified: Secondary | ICD-10-CM

## 2011-06-04 DIAGNOSIS — D392 Neoplasm of uncertain behavior of placenta: Secondary | ICD-10-CM

## 2011-06-04 LAB — PROTIME-INR: INR: 3.1 (ref 2.00–3.50)

## 2011-06-05 ENCOUNTER — Encounter (HOSPITAL_BASED_OUTPATIENT_CLINIC_OR_DEPARTMENT_OTHER): Payer: BC Managed Care – PPO | Admitting: Oncology

## 2011-06-05 DIAGNOSIS — Z5189 Encounter for other specified aftercare: Secondary | ICD-10-CM

## 2011-06-05 DIAGNOSIS — Z452 Encounter for adjustment and management of vascular access device: Secondary | ICD-10-CM

## 2011-06-05 DIAGNOSIS — D392 Neoplasm of uncertain behavior of placenta: Secondary | ICD-10-CM

## 2011-06-05 DIAGNOSIS — Z5111 Encounter for antineoplastic chemotherapy: Secondary | ICD-10-CM

## 2011-06-06 ENCOUNTER — Encounter (HOSPITAL_BASED_OUTPATIENT_CLINIC_OR_DEPARTMENT_OTHER): Payer: BC Managed Care – PPO | Admitting: Oncology

## 2011-06-06 DIAGNOSIS — Z5189 Encounter for other specified aftercare: Secondary | ICD-10-CM

## 2011-06-07 ENCOUNTER — Encounter: Payer: BC Managed Care – PPO | Admitting: Oncology

## 2011-06-09 ENCOUNTER — Other Ambulatory Visit: Payer: Self-pay | Admitting: Oncology

## 2011-06-09 ENCOUNTER — Encounter (HOSPITAL_BASED_OUTPATIENT_CLINIC_OR_DEPARTMENT_OTHER): Payer: BC Managed Care – PPO | Admitting: Oncology

## 2011-06-09 DIAGNOSIS — D392 Neoplasm of uncertain behavior of placenta: Secondary | ICD-10-CM

## 2011-06-09 LAB — PROTHROMBIN TIME
INR: 2.7 — ABNORMAL HIGH (ref <1.50)
Prothrombin Time: 29.1 seconds — ABNORMAL HIGH (ref 11.6–15.2)

## 2011-06-09 LAB — CBC WITH DIFFERENTIAL/PLATELET
Basophils Absolute: 0.2 10*3/uL — ABNORMAL HIGH (ref 0.0–0.1)
EOS%: 1.2 % (ref 0.0–7.0)
Eosinophils Absolute: 0.1 10*3/uL (ref 0.0–0.5)
HGB: 9.1 g/dL — ABNORMAL LOW (ref 11.6–15.9)
NEUT#: 5.9 10*3/uL (ref 1.5–6.5)
RDW: 17.9 % — ABNORMAL HIGH (ref 11.2–14.5)
lymph#: 1.5 10*3/uL (ref 0.9–3.3)

## 2011-06-09 LAB — COMPREHENSIVE METABOLIC PANEL
ALT: 18 U/L (ref 0–35)
AST: 22 U/L (ref 0–37)
Alkaline Phosphatase: 59 U/L (ref 39–117)
Calcium: 8.7 mg/dL (ref 8.4–10.5)
Chloride: 103 mEq/L (ref 96–112)
Creatinine, Ser: 0.79 mg/dL (ref 0.50–1.10)
Potassium: 3.3 mEq/L — ABNORMAL LOW (ref 3.5–5.3)

## 2011-06-11 ENCOUNTER — Inpatient Hospital Stay (HOSPITAL_COMMUNITY)
Admission: AD | Admit: 2011-06-11 | Discharge: 2011-06-13 | DRG: 410 | Disposition: A | Discharge status: Home / Self Care | Payer: BC Managed Care – PPO | Source: Ambulatory Visit | Attending: Oncology | Admitting: Oncology

## 2011-06-11 ENCOUNTER — Inpatient Hospital Stay (HOSPITAL_COMMUNITY): Payer: BC Managed Care – PPO

## 2011-06-11 DIAGNOSIS — D392 Neoplasm of uncertain behavior of placenta: Secondary | ICD-10-CM

## 2011-06-11 DIAGNOSIS — D500 Iron deficiency anemia secondary to blood loss (chronic): Secondary | ICD-10-CM | POA: Diagnosis present

## 2011-06-11 DIAGNOSIS — C58 Malignant neoplasm of placenta: Secondary | ICD-10-CM | POA: Diagnosis present

## 2011-06-11 DIAGNOSIS — T451X5A Adverse effect of antineoplastic and immunosuppressive drugs, initial encounter: Secondary | ICD-10-CM | POA: Diagnosis present

## 2011-06-11 DIAGNOSIS — Z5111 Encounter for antineoplastic chemotherapy: Secondary | ICD-10-CM

## 2011-06-11 DIAGNOSIS — D6481 Anemia due to antineoplastic chemotherapy: Secondary | ICD-10-CM | POA: Diagnosis present

## 2011-06-11 DIAGNOSIS — I82409 Acute embolism and thrombosis of unspecified deep veins of unspecified lower extremity: Secondary | ICD-10-CM

## 2011-06-11 DIAGNOSIS — Z7901 Long term (current) use of anticoagulants: Secondary | ICD-10-CM

## 2011-06-11 DIAGNOSIS — I10 Essential (primary) hypertension: Secondary | ICD-10-CM | POA: Diagnosis present

## 2011-06-11 DIAGNOSIS — Z86718 Personal history of other venous thrombosis and embolism: Secondary | ICD-10-CM

## 2011-06-11 LAB — URINALYSIS, ROUTINE W REFLEX MICROSCOPIC
Glucose, UA: NEGATIVE mg/dL
Ketones, ur: NEGATIVE mg/dL
Leukocytes, UA: NEGATIVE
Specific Gravity, Urine: 1.005 (ref 1.005–1.030)
pH: 6.5 (ref 5.0–8.0)

## 2011-06-11 LAB — URINALYSIS, DIPSTICK ONLY
Glucose, UA: 100 mg/dL — AB
Hgb urine dipstick: NEGATIVE
Leukocytes, UA: NEGATIVE
Specific Gravity, Urine: 1.008 (ref 1.005–1.030)
pH: 8 (ref 5.0–8.0)

## 2011-06-12 LAB — BASIC METABOLIC PANEL
CO2: 27 mEq/L (ref 19–32)
Calcium: 9.2 mg/dL (ref 8.4–10.5)
Chloride: 100 mEq/L (ref 96–112)
Creatinine, Ser: 0.63 mg/dL (ref 0.50–1.10)
GFR calc Af Amer: >60 mL/min (ref >60)
Sodium: 137 mEq/L (ref 135–145)

## 2011-06-12 LAB — DIFFERENTIAL
Basophils Relative: 0 % (ref 0–1)
Lymphocytes Relative: 10 % — ABNORMAL LOW (ref 12–46)
Monocytes Relative: 7 % (ref 3–12)
Neutro Abs: 6.2 10*3/uL (ref 1.7–7.7)
Neutrophils Relative %: 83 % — ABNORMAL HIGH (ref 43–77)

## 2011-06-12 LAB — GLUCOSE, CAPILLARY
Glucose-Capillary: 264 mg/dL — ABNORMAL HIGH (ref 70–99)
Glucose-Capillary: 313 mg/dL — ABNORMAL HIGH (ref 70–99)

## 2011-06-12 LAB — URINALYSIS, DIPSTICK ONLY
Bilirubin Urine: NEGATIVE
Bilirubin Urine: NEGATIVE
Glucose, UA: >1000 mg/dL — AB
Hgb urine dipstick: NEGATIVE
Hgb urine dipstick: NEGATIVE
Leukocytes, UA: NEGATIVE
Nitrite: NEGATIVE
Nitrite: NEGATIVE
Nitrite: NEGATIVE
Protein, ur: NEGATIVE mg/dL
Specific Gravity, Urine: 1.008 (ref 1.005–1.030)
Specific Gravity, Urine: 1.009 (ref 1.005–1.030)
Specific Gravity, Urine: 1.01 (ref 1.005–1.030)
Specific Gravity, Urine: 1.023 (ref 1.005–1.030)
Urobilinogen, UA: 0.2 mg/dL (ref 0.0–1.0)
Urobilinogen, UA: 0.2 mg/dL (ref 0.0–1.0)
Urobilinogen, UA: 0.2 mg/dL (ref 0.0–1.0)
pH: 7.5 (ref 5.0–8.0)
pH: 8 (ref 5.0–8.0)
pH: 8 (ref 5.0–8.0)

## 2011-06-12 LAB — CBC
HCT: 26.9 % — ABNORMAL LOW (ref 36.0–46.0)
Hemoglobin: 8.4 g/dL — ABNORMAL LOW (ref 12.0–15.0)
MCHC: 31.2 g/dL (ref 30.0–36.0)
RDW: 19 % — ABNORMAL HIGH (ref 11.5–15.5)
WBC: 7.5 10*3/uL (ref 4.0–10.5)

## 2011-06-12 LAB — PROTIME-INR
INR: 2.65 — ABNORMAL HIGH (ref 0.00–1.49)
Prothrombin Time: 28.7 seconds — ABNORMAL HIGH (ref 11.6–15.2)

## 2011-06-13 LAB — GLUCOSE, CAPILLARY: Glucose-Capillary: 309 mg/dL — ABNORMAL HIGH (ref 70–99)

## 2011-06-13 LAB — BASIC METABOLIC PANEL
BUN: 12 mg/dL (ref 6–23)
GFR calc Af Amer: >60 mL/min (ref >60)
GFR calc non Af Amer: >60 mL/min (ref >60)
Potassium: 4 mEq/L (ref 3.5–5.1)
Sodium: 135 mEq/L (ref 135–145)

## 2011-06-13 LAB — PROTIME-INR: Prothrombin Time: 25.7 seconds — ABNORMAL HIGH (ref 11.6–15.2)

## 2011-06-13 LAB — HEMOGLOBIN A1C: Mean Plasma Glucose: 192 mg/dL — ABNORMAL HIGH (ref <117)

## 2011-06-13 LAB — URINALYSIS, DIPSTICK ONLY
Leukocytes, UA: NEGATIVE
Nitrite: NEGATIVE
Specific Gravity, Urine: 1.014 (ref 1.005–1.030)
Urobilinogen, UA: 0.2 mg/dL (ref 0.0–1.0)
pH: 8 (ref 5.0–8.0)

## 2011-06-16 ENCOUNTER — Other Ambulatory Visit: Payer: Self-pay | Admitting: Oncology

## 2011-06-16 ENCOUNTER — Encounter (HOSPITAL_BASED_OUTPATIENT_CLINIC_OR_DEPARTMENT_OTHER): Payer: BC Managed Care – PPO | Admitting: Oncology

## 2011-06-16 DIAGNOSIS — Z5111 Encounter for antineoplastic chemotherapy: Secondary | ICD-10-CM

## 2011-06-16 DIAGNOSIS — O019 Hydatidiform mole, unspecified: Secondary | ICD-10-CM

## 2011-06-16 DIAGNOSIS — Z452 Encounter for adjustment and management of vascular access device: Secondary | ICD-10-CM

## 2011-06-16 DIAGNOSIS — D392 Neoplasm of uncertain behavior of placenta: Secondary | ICD-10-CM

## 2011-06-16 LAB — IRON AND TIBC
%SAT: 22 % (ref 20–55)
Iron: 50 ug/dL (ref 42–145)
TIBC: 227 ug/dL — ABNORMAL LOW (ref 250–470)

## 2011-06-16 LAB — CBC WITH DIFFERENTIAL/PLATELET
Basophils Absolute: 0 10*3/uL (ref 0.0–0.1)
EOS%: 1.9 % (ref 0.0–7.0)
HCT: 25.9 % — ABNORMAL LOW (ref 34.8–46.6)
HGB: 8.5 g/dL — ABNORMAL LOW (ref 11.6–15.9)
MCH: 23.5 pg — ABNORMAL LOW (ref 25.1–34.0)
MONO#: 0 10*3/uL — ABNORMAL LOW (ref 0.1–0.9)
NEUT%: 80.3 % — ABNORMAL HIGH (ref 38.4–76.8)
Platelets: 240 10*3/uL (ref 145–400)
lymph#: 0.8 10*3/uL — ABNORMAL LOW (ref 0.9–3.3)

## 2011-06-16 LAB — HCG, QUANTITATIVE, PREGNANCY: hCG, Beta Chain, Quant, S: <2 m[IU]/mL

## 2011-06-18 ENCOUNTER — Encounter (HOSPITAL_BASED_OUTPATIENT_CLINIC_OR_DEPARTMENT_OTHER): Payer: BC Managed Care – PPO | Admitting: Oncology

## 2011-06-18 DIAGNOSIS — D392 Neoplasm of uncertain behavior of placenta: Secondary | ICD-10-CM

## 2011-06-18 DIAGNOSIS — Z5111 Encounter for antineoplastic chemotherapy: Secondary | ICD-10-CM

## 2011-06-19 ENCOUNTER — Encounter (HOSPITAL_BASED_OUTPATIENT_CLINIC_OR_DEPARTMENT_OTHER): Payer: BC Managed Care – PPO | Admitting: Oncology

## 2011-06-19 DIAGNOSIS — Z5189 Encounter for other specified aftercare: Secondary | ICD-10-CM

## 2011-06-19 DIAGNOSIS — D392 Neoplasm of uncertain behavior of placenta: Secondary | ICD-10-CM

## 2011-06-20 ENCOUNTER — Encounter (HOSPITAL_BASED_OUTPATIENT_CLINIC_OR_DEPARTMENT_OTHER): Payer: BC Managed Care – PPO | Admitting: Oncology

## 2011-06-20 DIAGNOSIS — D392 Neoplasm of uncertain behavior of placenta: Secondary | ICD-10-CM

## 2011-06-20 DIAGNOSIS — Z5189 Encounter for other specified aftercare: Secondary | ICD-10-CM

## 2011-06-21 ENCOUNTER — Encounter (HOSPITAL_BASED_OUTPATIENT_CLINIC_OR_DEPARTMENT_OTHER): Payer: BC Managed Care – PPO | Admitting: Oncology

## 2011-06-21 DIAGNOSIS — Z5189 Encounter for other specified aftercare: Secondary | ICD-10-CM

## 2011-06-21 DIAGNOSIS — D392 Neoplasm of uncertain behavior of placenta: Secondary | ICD-10-CM

## 2011-06-25 ENCOUNTER — Other Ambulatory Visit: Payer: Self-pay | Admitting: Oncology

## 2011-06-25 ENCOUNTER — Encounter (HOSPITAL_BASED_OUTPATIENT_CLINIC_OR_DEPARTMENT_OTHER): Payer: BC Managed Care – PPO | Admitting: Oncology

## 2011-06-25 DIAGNOSIS — Z5111 Encounter for antineoplastic chemotherapy: Secondary | ICD-10-CM

## 2011-06-25 DIAGNOSIS — Z452 Encounter for adjustment and management of vascular access device: Secondary | ICD-10-CM

## 2011-06-25 DIAGNOSIS — D392 Neoplasm of uncertain behavior of placenta: Secondary | ICD-10-CM

## 2011-06-25 LAB — CBC WITH DIFFERENTIAL/PLATELET
BASO%: 0.5 % (ref 0.0–2.0)
EOS%: 2.1 % (ref 0.0–7.0)
MCH: 24 pg — ABNORMAL LOW (ref 25.1–34.0)
MCV: 74.3 fL — ABNORMAL LOW (ref 79.5–101.0)
MONO%: 16.6 % — ABNORMAL HIGH (ref 0.0–14.0)
RBC: 3.83 10*6/uL (ref 3.70–5.45)
RDW: 21.9 % — ABNORMAL HIGH (ref 11.2–14.5)

## 2011-06-25 LAB — PROTIME-INR
INR: 2.7 (ref 2.00–3.50)
Protime: 32.4 Seconds — ABNORMAL HIGH (ref 10.6–13.4)

## 2011-07-02 ENCOUNTER — Other Ambulatory Visit: Payer: Self-pay | Admitting: Oncology

## 2011-07-02 ENCOUNTER — Encounter (HOSPITAL_BASED_OUTPATIENT_CLINIC_OR_DEPARTMENT_OTHER): Payer: BC Managed Care – PPO | Admitting: Oncology

## 2011-07-02 DIAGNOSIS — Z7901 Long term (current) use of anticoagulants: Secondary | ICD-10-CM

## 2011-07-02 DIAGNOSIS — D392 Neoplasm of uncertain behavior of placenta: Secondary | ICD-10-CM

## 2011-07-02 DIAGNOSIS — Z5111 Encounter for antineoplastic chemotherapy: Secondary | ICD-10-CM

## 2011-07-02 LAB — CBC WITH DIFFERENTIAL/PLATELET
Basophils Absolute: 0 10*3/uL (ref 0.0–0.1)
Eosinophils Absolute: 0.1 10*3/uL (ref 0.0–0.5)
HGB: 9.4 g/dL — ABNORMAL LOW (ref 11.6–15.9)
LYMPH%: 27.8 % (ref 14.0–49.7)
MCV: 73.5 fL — ABNORMAL LOW (ref 79.5–101.0)
MONO%: 9.3 % (ref 0.0–14.0)
NEUT#: 3.9 10*3/uL (ref 1.5–6.5)
Platelets: 235 10*3/uL (ref 145–400)
RDW: 22.5 % — ABNORMAL HIGH (ref 11.2–14.5)

## 2011-07-03 ENCOUNTER — Ambulatory Visit: Payer: BC Managed Care – PPO | Attending: Gynecologic Oncology | Admitting: Gynecologic Oncology

## 2011-07-03 DIAGNOSIS — O019 Hydatidiform mole, unspecified: Secondary | ICD-10-CM | POA: Insufficient documentation

## 2011-07-03 DIAGNOSIS — R110 Nausea: Secondary | ICD-10-CM | POA: Insufficient documentation

## 2011-07-03 DIAGNOSIS — Z7901 Long term (current) use of anticoagulants: Secondary | ICD-10-CM | POA: Insufficient documentation

## 2011-07-03 DIAGNOSIS — R51 Headache: Secondary | ICD-10-CM | POA: Insufficient documentation

## 2011-07-04 NOTE — Consult Note (Signed)
[name], LEAF NO.:  [id number]  MEDICAL RECORD NO.:  [id number]  LOCATION:  GYN                          FACILITY:  [facility]  PHYSICIAN:  Laurette Schimke, MD     DATE OF BIRTH:  Jun 27, 1971  DATE OF CONSULTATION:  07/03/2011 DATE OF DISCHARGE:                                CONSULTATION   REASON FOR VISIT:  Surveillance, status post treatment of high-risk metastatic gestational trophoblastic neoplasia.  HISTORY OF PRESENT ILLNESS:  This is a 40 year old gravida 4, para 2 with 1 neonatal demise, last pregnancy 2-1/2 years ago, that  resulted in miscarriage.  She presented with bleeding in April 2012.  D and C was performed at that time, returned with evidence of a complete molar pregnancy.  Beta HCG after the D and C was 158,467.  The beta HCG's declined and then rose to 257,443.  Methotrexate was initiated; however, plateaued at 175,721.  She was then seen in evaluation on Mar 25, 2011, and calculation of the [facility] score returned with a value of 8 which required treatment with [facility].  She received 6 cycles of EMACO.  Of note, there was normalization of her beta HCG just prior to cycle 5. The last HCG returned a value of less than 2.  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  The treatment course was also complicated by a clot in the PICC line for which she is receiving Coumadin.  PAST MEDICAL HISTORY: 1. Hypertension. 2. High-risk metastatic gestational trophoblastic neoplasia.  PAST SURGICAL HISTORY: 1. Oral surgery, 2008. 2. D and C, 2012.  GYN HISTORY:  Unchanged.  SOCIAL HISTORY:  Unchanged.  [name]:  No headache, chest pain, shortness of breath.  No bleeding.  No hemoptysis, hematochezia, or hematuria.  Denies lower extremity edema and no diarrhea or constipation.  Otherwise 10-point review of systems is negative.  PHYSICAL EXAMINATION:   VITAL SIGNS:  Weight 304 pounds, blood pressure 130/70, pulse 72, temperature 98.9 CHEST:  Clear to auscultation. LYMPH NODE SURVEY:  No cervical, supraclavicular, or inguinal adenopathy. HEART:  Regular rate and rhythm. BACK:  No CVA tenderness. ABDOMEN:  Soft, nontender without any masses. PELVIC:  No lesions noted within the vagina or cervix.  Cervix is approximately 2 cm.  Uterus is mobile.  RECTAL:  Good anal sphincter tone without any masses. EXTREMITIES:  No clubbing, cyanosis, or edema.  IMPRESSION:  [name] is a 40 year old with high-risk gestational trophoblastic neoplasia with WHO score of 8.  At initial diagnosis, CT of the chest, abdomen, and pelvis was notable for several bilateral pulmonary nodules greater than 4 in the left lung and greater than 3 in the right lung.  Intrauterine mass was noted.  No metastatic disease is noted in the liver, spleen, or the vagina.  [name] has had an excellent response.  She is asymptomatic.  She has some residual neuropathy, a slight headache, and small amount of nausea.  I am very pleased with how well she is doing.  Dr. Darrold Span will follow her with appropriate labwork through October when she completes her coumadin therapy.  At that point we will resume her care along with Dr Normand Sloop. I appreciate the care provided by Dr. Jama Flavors.  Ms. Batson was counseled regarding the importance of being compliant with Depo Provera injections.     Laurette Schimke, MD     WB/MEDQ  D:  07/03/2011  T:  07/04/2011  Job:  [id number]  [id number] Darrold Span, MD    Naima A. Normand Sloop, M.D. Fax: [phone]  Lelon Perla, DO [address] [place], [place] [address]  Telford Nab, R.N. [address] [place], [place] [address]  Electronically Signed by Laurette Schimke MD on 07/04/2011 10:47:35 AM

## 2011-07-09 ENCOUNTER — Other Ambulatory Visit: Payer: Self-pay | Admitting: Oncology

## 2011-07-09 ENCOUNTER — Encounter (HOSPITAL_BASED_OUTPATIENT_CLINIC_OR_DEPARTMENT_OTHER): Payer: BC Managed Care – PPO | Admitting: Oncology

## 2011-07-09 DIAGNOSIS — D392 Neoplasm of uncertain behavior of placenta: Secondary | ICD-10-CM

## 2011-07-09 DIAGNOSIS — Z452 Encounter for adjustment and management of vascular access device: Secondary | ICD-10-CM

## 2011-07-09 DIAGNOSIS — Z5111 Encounter for antineoplastic chemotherapy: Secondary | ICD-10-CM

## 2011-07-09 LAB — CBC WITH DIFFERENTIAL/PLATELET
BASO%: 0.6 % (ref 0.0–2.0)
Basophils Absolute: 0 10*3/uL (ref 0.0–0.1)
EOS%: 3 % (ref 0.0–7.0)
HGB: 9.6 g/dL — ABNORMAL LOW (ref 11.6–15.9)
MCH: 23.5 pg — ABNORMAL LOW (ref 25.1–34.0)
MCHC: 31.8 g/dL (ref 31.5–36.0)
MCV: 74.1 fL — ABNORMAL LOW (ref 79.5–101.0)
MONO%: 9.5 % (ref 0.0–14.0)
RBC: 4.1 10*6/uL (ref 3.70–5.45)
RDW: 22 % — ABNORMAL HIGH (ref 11.2–14.5)

## 2011-07-09 LAB — BASIC METABOLIC PANEL
BUN: 9 mg/dL (ref 6–23)
Creatinine, Ser: 0.74 mg/dL (ref 0.50–1.10)
Potassium: 3.7 mEq/L (ref 3.5–5.3)

## 2011-07-09 LAB — PROTIME-INR: Protime: 38.4 Seconds — ABNORMAL HIGH (ref 10.6–13.4)

## 2011-07-09 NOTE — H&P (Signed)
[name], [name]               ACCOUNT NO.:  [id number]  MEDICAL RECORD NO.:  [id number]  LOCATION:  1321                         FAC[facility]  PHYSICIAN:  Reece Packer, M.D. 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  The patient was diagnosed with complete molar pregnancy in April 2012, with beta hCG 292,000 then.  She had D and C, however, subsequently had rising marker and CT chest, abdomen, and pelvis showed bilateral pulmonary nodules and uterine mass.  She had intramuscular methotrexate x2, was seen by Dr. Laurette Schimke in late May with beta hCG then 175,700.  Due to high-risk features, she has been treated since then with weekly chemotherapy on EMA-CO regimen.  She has had progressive drop in her marker, down to the normal range as of 2 days prior to cycle 5.  Course has been complicated by right upper extremity DVT secondary to a PICC line, with the patient now on Coumadin with new PICC in the left upper extremity.  She has been anemic secondary to GYN blood loss initially with iron deficiency, and the chemotherapy.  Blood pressure has been generally controlled on Norvasc, dental problems have been addressed, head CT early in the course was negative and her initial severe nausea resolved promptly.  She has needed Neupogen after day 8 since cycle 1.  She has not had bleeding.  She has had numbness and tingling in her thumbs and first two fingers bilaterally which seems likely related to some underlying carpal tunnel as well as possibly with vincristine.  She has had some slight dizziness when she gets up quickly, but no falls.  She does have some difficulty sleeping despite Restoril 30 mg at  bedtime, though this has been more helpful than other sleeping medications.  Remainder of full 10 point review of systems negative.  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  She is gravida 4, para 2 with 1 neonatal demise.  C-sections x2, hypertension, iron deficiency anemia secondary to the initial GYN bleeding, HIV negative in April 2012, Depo-Provera given April 17, 2011.  FAMILY HISTORY:  Mother with diabetes and hypertension.  Father with hypertension.  Her daughter age 25 is healthy.  SOCIAL HISTORY:  Divorced, lives with her 95-year-old daughter and her mother has been staying with her since the diagnosis.  She has been in [place] for about 7 years from [place].  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  No tobacco.  Only social alcohol.  PHYSICAL EXAMINATION:  VITAL SIGNS:  Weight on August 8 was 302 pounds, height 70 inches, BSA capped to 2.  Temperature 98.2, heart rate 124, respirations 18, blood pressure 121/84, O2 saturation 96%. HEENT:  Sclerae nonicteric.  Oropharynx is clear. CHEST: Clear to auscultation bilaterally.  No vertebral body tenderness. CARDIAC:  She is tachycardic without murmur, gallop, or rub. ABDOMEN:  Obese, soft, nontender, nondistended.  No masses or organomegaly. EXTREMITIES:  No edema or point tenderness in the calves.  No palpable cords.  Left upper extremity PICC line, no evidence of infection, nontender.  No swelling.  Right upper extremity is nontender and full range of motion.  LABORATORY DATA:  Labs from the June 09, 2011, white count 8.6, ANC 5.9, hemoglobin 9.1, and platelets 191,000.  MCV is 71, INR that day was 2.7, beta hCG less than 2.  Sodium 135, potassium 3.3, which will be supplemented, chloride 103, CO2 of 21, glucose 222, BUN 13, creatinine 0.79, total bili 0.3, alk phos 59, GOT 22,  GPT 18, total protein 6.7, albumin 3.5, and calcium 8.7.  Repeat PT/INR on admission had INR of 3.1 and Coumadin will be held today.  Chest x-ray today shows normal heart size with no heart failure, no infiltrate or effusion, left PICC tip is in the superior vena cava, pulled back slightly from the prior exams. No clearly evident lung nodules on present chest x-ray, though question of a 4-mm right upper lobe nodule.  IMPRESSION AND PLAN: 1. High-risk metastatic gestational trophoblastic disease:  Admit for     day 1 and day 2 of cycle 6 chemotherapy, to begin day of admission.     Hopefully, this will be her last cycle as this is her second cycle     beyond normal marker. 2. PICC line in place, left upper extremity. 3. DVT right upper extremity secondary to her first PICC line, on     Coumadin with adjustments to be made in the hospital following PT     daily. 4. Hypertension, on Norvasc. 5. Intermittently elevated blood sugars thought likely secondary to     Decadron and necessary D5W, though she may have some underlying     propensity to elevated blood sugars and we will need this followed     up after we complete chemotherapy. 6. Iron deficiency and chemotherapy-induced anemia, continuing p.o.     iron.     Reece Packer, M.D.     LPL/MEDQ  D:  06/11/2011  T:  06/12/2011  Job:  [id number]  cc:   Laurette Schimke, MD  Lelon Perla, DO [address] [place], [place] [address]  Electronically Signed by Jama Flavors M.D. on 07/09/2011 08:25:57 AM

## 2011-07-09 NOTE — Discharge Summary (Signed)
[name], THIEM               ACCOUNT NO.:  [id number]  MEDICAL RECORD NO.:  [id number]  LOCATION:  1308                         FACILITY:  [facility]  PHYSICIAN:  Reece Packer, M.D. 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  History of her presentation and course to date, see the dictated H and P.  [name], past medical history, family history, and social history is in the dictated H and P.  PHYSICAL EXAMINATION ON ADMISSION:  VITAL SIGNS:  Weight 293 pounds, height 70 inches.  Temperature 98.8, heart rate 109 and regular, respirations 20, blood pressure 133/85, room air saturation 100%. GENERAL:  Awake, alert, comfortable in bed on room air.  PICC line in the left upper extremity infusing well and did drop blood after an hour of IV fluids. HEENT:  Alopecia.  Pupils equal and reactive.  Nonicteric.  Mouth clear and moist. LUNGS:  Clear to auscultation and percussion. HEART:  Regular rate and rhythm.  PICC site, left upper extremity, not remarkable.  About 2 cm above the insertion site, there is some slight tenderness to direct palpation where I can feel the tubing, no erythema, no streaking, no swelling, no drainage.  The upper extremity otherwise is not remarkable. ABDOMEN:  Soft and nontender with bowel sounds present and normally active, obese. BREASTS:  Has been repeatedly negative and was not be done today.  Right upper extremity, no longer any swelling, full range of motion. LOWER EXTREMITIES:  Without pitting edema or cords.  She has a 3 mm ecchymotic area on the left 4th toe, no other ecchymoses, petechiae, or other bleeding. NEUROLOGIC:  Nonfocal with speech fluent.  The patient fully  oriented, appropriate, moving easily in bed.  LABORATORY DATA:  All done at cancer center on May 26, 2011, including on beta-HCG of 3.3, white count 6.2, hemoglobin 9.5, platelets 157, MCV of 69.  Sodium 138, potassium 3.3, chloride 105, CO2 of 21, glucose 176, BUN 10, creatinine 0.8, alk phos 53, GOT 16, GPT 17, total protein 6.9, albumin 3.5, calcium 8.7.  Coumadin was held on July 31st for an INR 4.2 on that day, likely related to the Keflex.  COURSE IN HOSPITAL:  The patient was admitted to the oncology unit and begun on appropriate bicarb hydration prior to starting chemotherapy. The methotrexate was begun after urine pH was greater than 6.5. Chemotherapy was begun on the day of admission.  The IV fluids with the chemotherapy were D5 with bicarb 100 mEq per liter at 250 cc an hour for 1 liter on admission, then 150 cc an hour.  Premedication, day #1 was Zofran 16 mg IV, EMEND 150 mg IV, Decadron 10 mg IV, and day #2 was repeated Zofran and Decadron.  Ativan was 1 mg p.r.n. with premeds. Chemotherapy was etoposide 100 mg per meter squared equals 200 mg IV over 60 minutes, day #1 and day #2; dactinomycin 0.5 mg IV bolus over 2- 5 minutes,  day #1 and day #2; methotrexate 100 mg per meter squared equals 200 mg IV bolus over 1 minute, day #1; and methotrexate 200 mg per meter squared equals 400 mg IV continuous infusion over 12 hours, day #1.  Leucovorin 30 mg q.12 h. was begun 24 hours following the methotrexate bolus.  The patient continued to tolerate the chemotherapy very well this admission, with nausea completely controlled, and no other problems that appeared related to that.  She does have some numbness, thumb and first two fingers of her hands bilaterally, but none in the 4th or 5th fingers, none in her feet, and I think this may be more related to an underlying carpal tunnel.  We may evaluate for carpal tunnel when the acute problem is resolved.  The patient's Coumadin was  dosed daily based on the INR, with a dose of 5 mg given on the day of admission for INR than of 3.1.  On the hospital day #2, the INR was 2.4 and she was given 7.5 mg and INR on August 3rd, which was on the day of discharge was 2.27 with the patient instructed to use 7.5 mg of Coumadin on August 3rd and daily through the weekend, then for follow up at cancer center on August 6th.  MEDICATIONS AT DISCHARGE: 1. Leucovorin 30 mg q.12 h. for 4 doses. 2. Ferrous gluconate 324 mg daily. 3. The Coumadin 7.5 mg daily until we repeat PT/INR on August 6th. 4. Protonix 40 mg daily. 5. Norvasc 10 mg daily. 6. Restoril 15-30 mg at bedtime p.r.n. 7. Zofran 8-16 mg q.12 h. p.r.n. 8. MiraLax 1 capful daily. 9. Lorazepam 1 mg q.4-6 h. p.r.n. 10.Hycodan syrup 5 cc q.6 h. p.r.n.  FOLLOWUP:  The patient will follow up at the cancer center on Monday, August 6th, at 1:30 for labs and PICC flush, then will have day #8, cycle 5 EMA-CO on August 8th, also at the cancer center.  I anticipate that we will admit again the following week for day #1 and day #2 of cycle 6, then I expect that we will complete all of the chemotherapy with day #8, cycle 6 treatment.  Chest x-ray also repeated this admission showed no effusion and no pulmonary nodules (initial pulmonary nodules also not detected on CT, only on the staging CT chest).  FINAL DIAGNOSES: 1. High-risk metastatic gestational trophoblastic disease:  Continuing     chemotherapy, now having administered day #1 and day #2 of cycle 5     EMA-CO in hospital.  Beta-HCG was down to normal range on May 26, 2011. 2. PICC line in place, left upper extremity. 3. Deep venous thrombosis related to PICC line, right upper extremity     previously, continuing Coumadin. 4. Hypertension with good blood pressures on Norvasc by Dr. Laury Axon. 5. Multifactorial anemia including iron deficiency related to initial     GYN blood loss, also chemotherapy related:  Continuing  oral iron. 6. 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  OTHER LABORATORY DATA IN HOSPITAL:  BMET on August 2nd, sodium 136; potassium 3.6, post oral potassium on admission; chloride 100; CO2 of 27; glucose 242, which seemed related to the Decadron; and IVD5W and will follow outpatient BUN 6, creatinine 0.67.  Urinalysis had pH in good range at 7.5, no blood, or leukocytes.  DIET:  As tolerated.  PICC line care will be at the cancer center.  ACTIVITY:  As tolerated.  CONDITION ON DISCHARGE:  Good.     Reece Packer, M.D.     LPL/MEDQ  D:  05/30/2011  T:  05/31/2011  Job:  [id number]  cc:   Lelon Perla, DO [address] [place], [place] [address]  Laurette Schimke, MD  Electronically Signed by Jama Flavors M.D. on 07/09/2011 08:24:58 AM

## 2011-07-09 NOTE — H&P (Signed)
[name]               ACCOUNT NO.:  [id number]  MEDICAL RECORD NO.:  [id number]  LOCATION:  1308                         FACILITY:  [facility]  PHYSICIAN:  Reece Packer, M.D. DATE OF BIRTH:  Jan 12, 1971  DATE OF ADMISSION:  05/28/2011 DATE OF DISCHARGE:                             HISTORY & PHYSICAL   HISTORY OF PRESENT ILLNESS:  The patient is a 40 year old lady with high- risk metastatic gestational trophoblastic disease, admitted for day 1 and day 2 of cycle 5 EMA-CO chemotherapy.  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  Staging scan showed bilateral pulmonary nodules and uterine mass on Mar 20, 2011; subsequent head CT was negative.  She had progressive drop in beta hCG, which was down to 14 prior to cycle 4 and normal at 3.3 on May 26, 2011 (planned for 2 cycles following marker normalizing).  Course has been complicated by right upper extremity DVT related to the initial PICC line, with the patient now on Coumadin.  She also had initial nausea, which is resolved and has ongoing iron deficiency anemia plus chemotherapy anemia now.  Present PICC line was placed in the left upper extremity by IR on May 15, 2011.  There was some tenderness just above the insertion site on July 25, with the patient begun on Keflex then.  The tenderness has improved and she has had no fever, no swelling, or erythema and no worsening symptoms otherwise.  Hypertension has been better controlled with Norvasc and the previous cough felt related to lisinopril has essentially resolved.  She has been able to sleep at night, using Restoril.  Labs at the cancer center on May 26, 2011, beta hCG 3.3; white count 6.2, hemoglobin 9.5, MCV of 69, platelets 157; sodium 138, potassium 3.3, chloride 105, CO2 21, glucose 176, BUN 10, creatinine 0.84, alk phos 53, GOT 16, GPT  17, total protein 6.9, albumin 3.5, calcium 8.7; and INR on July 31st was elevated at 4.2 with Coumadin held on July 31st.  [name]:  The left PICC line just above the insertion site has improved with Keflex as above.  No discomfort in the axilla or shoulder on the left and none now on the right.  She has some slight cough when the air conditioner in the car is blowing on her face, this is nonproductive and otherwise no cough.  Bowels have been moving well daily with MiraLax.  She had increased bleeding when she cut her finger on July 30th.  She has some persistent slight numbness in thumb and first 2 fingers bilaterally with none in the 4th and 5th fingers bilaterally and none in her feet.  PAST MEDICAL HISTORY:  Allergy to SULFA causing itching, sensitive to TAPE, cough with LISINOPRIL.  She is gravida 4, para 2 with one neonatal demise.  She had low iron stores in June.  C-sections x2.  Hypertension for 7 years.  She had Depo-Provera given on April 17, 2011.  HIV was negative in April 2012.  FAMILY HISTORY:  Mother with diabetes and hypertension.  Father with  hypertension.  Her 27-year-old daughter is healthy.  SOCIAL HISTORY:  She is originally from [place], is lived in [place] for 7 years, is divorced, teaches music at middle school in [place], though she has been out of work with the present illness. No tobacco.  Social alcohol.  No transfusions.  PHYSICAL EXAMINATION:  VITAL SIGNS:  Weight on July 25 was 293 pounds, height 70 inches.  Vitals per the EMR. GENERAL:  She is awake, alert, comfortable in bed on room air.  PICC line is infusing easily in the left upper extremity and drew blood well after she had had an hour of IV fluids. HEENT:  Alopecia.  Pupils equally reactive, nonicteric.  Mouth is clear and moist. LUNGS:  Clear. HEART:  Regular rate and rhythm.  The PICC site itself is not remarkable.  There is some minimal tenderness about 2 cm  above the insertion without streaking, no erythema.  No swelling. ABDOMEN:  Soft and nontender.  Some bowel sounds present.  Obese. LOWER EXTREMITIES: No edema or cords.  She has a 3 mm ecchymotic area on her left 4th toe.  No other ecchymoses, petechiae, or overt bleeding noted. NEURO:  She moves easily in bed.  Speech is fluent, fully oriented, and appropriate.  LABORATORY DATA:  Labs pending now.  PT and INR in the labs from cancer center on July 30th attached.  IMPRESSION AND PLAN: 1. High-risk metastatic gestational trophoblastic disease:  Markers     now normal as of May 26, 2011.  We will continue cycle 5 of ENA-CO     as planned. 2. PICC line now in left upper extremity:  She is completing Keflex     for what was thought possibly some very early cellulitis, which has     improved.  No evidence of active infection that I can tell now. 3. Right upper extremity deep venous thrombosis related to the 1st     PICC line, on Coumadin with INR excessively prolonged yesterday     secondary to the antibiotic, but no overt bleeding.  Recheck INR     today and dose Coumadin from there. 4. Hypertension with blood pressures good on the present regimen. 5. Iron deficiency anemia and anemia related to the chemotherapy, not     symptomatic enough for transfusion, continuing oral iron.     Reece Packer, M.D.     LPL/MEDQ  D:  05/28/2011  T:  05/29/2011  Job:  [id number]  cc:   Kathy Schimke, MD  Dr. __________  Electronically Signed by Jama Flavors M.D. on 07/09/2011 08:24:10 AM

## 2011-07-14 ENCOUNTER — Encounter: Payer: Self-pay | Admitting: Family Medicine

## 2011-07-14 ENCOUNTER — Ambulatory Visit (INDEPENDENT_AMBULATORY_CARE_PROVIDER_SITE_OTHER): Payer: BC Managed Care – PPO | Admitting: Family Medicine

## 2011-07-14 DIAGNOSIS — R739 Hyperglycemia, unspecified: Secondary | ICD-10-CM

## 2011-07-14 DIAGNOSIS — Z0000 Encounter for general adult medical examination without abnormal findings: Secondary | ICD-10-CM

## 2011-07-14 DIAGNOSIS — R7309 Other abnormal glucose: Secondary | ICD-10-CM

## 2011-07-14 LAB — POCT URINALYSIS DIPSTICK
Blood, UA: NEGATIVE
Leukocytes, UA: NEGATIVE
Nitrite, UA: NEGATIVE
Urobilinogen, UA: 0.2
pH, UA: 6.5

## 2011-07-14 NOTE — Patient Instructions (Signed)
Hyperglycemia   (High Blood Glucose)   Hyperglycemia occurs when the glucose (sugar) in your blood is too high. Hyperglycemia can happen for many reasons, but it most often happens to people who do not know they have diabetes or are not managing their diabetes properly.   CAUSES   Whether you have diabetes or not, there are other causes of hyperglycemia. Hyperglycemia can occur when you have diabetes, but it can also occur in other situations that you might not be as aware of, such as:   Diabetes   If you have diabetes and are having problems controlling your blood glucose, hyperglycemia could occur because of some of the following reasons:   Not following your meal plan.   Not taking your diabetes medications or not taking it properly.   Exercising less or doing less activity than you normally do.   Being sick.   Pre-diabetes   This cannot be ignored. Before people develop Type 2 diabetes, they almost always have “pre-diabetes.” This is when your blood glucose levels are higher than normal, but not yet high enough to be diagnosed as diabetes. Research has shown that some long-term damage to the body, especially the heart and circulatory system, may already be occurring during pre-diabetes. If you take action to manage your blood glucose when you have pre-diabetes, you may delay or prevent Type 2 diabetes from developing.   Stress   If you have diabetes, you may be “diet” controlled or on oral medications or insulin to control your diabetes. However, you may find that your blood glucose is higher than usual in the hospital whether you have diabetes or not. This is often referred to as “stress hyperglycemia.” Stress can elevate your blood glucose. This happens because of hormones put out by the body during times of stress. If stress has been the cause of your high blood glucose, it can be followed regularly by your caregiver. That way he/she can make sure your hyperglycemia does not continue to get worse or progress  to diabetes.   Steroids   Steroids are medications that act on the infection fighting system (immune system) to block inflammation or infection. One side effect can be a rise in blood glucose. Most people can produce enough extra insulin to allow for this rise, but for those who cannot, steroids make blood glucose levels go even higher. It is not unusual for steroid treatments to “uncover” diabetes that is developing. It is not always possible to determine if the hyperglycemia will go away after the steroids are stopped. A special blood test called an A1c is sometimes done to determine if your blood glucose was elevated before the steroids were started.   SYMPTOMS OF HYPERGLYCEMIA   Thirsty.   Frequent urination.   Dry mouth.   Blurred vision.   Tired or fatigue.   Weakness.   Sleepy.   Tingling in feet or leg.   DIAGNOSIS   Diagnosis is made by monitoring blood glucose in one or all of the following ways:   A1c test. This is a chemical found in your blood.   Fingerstick blood glucose monitoring.   Laboratory results.   TREATMENT   First, knowing the cause of the hyperglycemia is important before the hyperglycemia can be treated. Treatment may include, but is not be limited to:   Education.   Change or adjustment in medications.   Change or adjustment in meal plan.   Treatment for an illness, infection, etc.   More frequent blood glucose   monitoring.   Change in exercise plan.   Decreasing or stopping steroids.   Lifestyle changes.   HOME CARE INSTRUCTIONS   Test your blood glucose as directed.   Exercise regularly. Your caregiver will give you instructions about exercise. Pre-diabetes or diabetes which comes on with stress is helped by exercising.   Eat wholesome, balanced meals. Eat often and at regular, fixed times. Your caregiver or nutritionist will give you a meal plan to guide your sugar intake.   Being at an ideal weight is important. If needed, losing as little as 10 to 15 pounds may help improve blood  glucose levels.   SEEK MEDICAL CARE IF:   You have questions about medicine, activity, or diet.   You continue to have symptoms (problems such as increased thirst, urination, or weight gain).   SEEK IMMEDIATE MEDICAL CARE IF:   You are vomiting or have diarrhea.   Your breath smells fruity.   You are breathing faster or slower.   You are very sleepy or incoherent.   You have numbness, tingling, or pain in your feet or hands.   You have chest pain.   Your symptoms get worse even though you have been following your caregiver's orders.   If you have any other questions or concerns.   Document Released: 04/08/2001 Document Re-Released: 08/10/2009   Exit[employer]® Patient Information ©2011 [employer].

## 2011-07-14 NOTE — Progress Notes (Signed)
  Subjective:    Patient ID: [name], female    DOB: [birth year], 40 y.o.   MRN: [id number]  HPI Pt here after gyn onc appointment.  Her glucose was found to be high.  Originally they thought is was the chemo but they wanted her to come here to be checked.    [name] As above    Objective:   Physical Exam  Constitutional: She appears well-developed and well-nourished.  Cardiovascular: Normal rate and regular rhythm.   Pulmonary/Chest: Effort normal and breath sounds normal.  Psychiatric: She has a normal mood and affect. Her behavior is normal. Judgment and thought content normal.          Assessment & Plan:  Hyperglycemia--- high 1 week ago ,  Only slightly elevated today                                Check labs today                                  Pt given diet info and glucometer to check bs bid

## 2011-07-15 LAB — CBC WITH DIFFERENTIAL/PLATELET
Basophils Relative: 0.5 % (ref 0.0–3.0)
Eosinophils Relative: 6.3 % — ABNORMAL HIGH (ref 0.0–5.0)
HCT: 31.7 % — ABNORMAL LOW (ref 36.0–46.0)
Lymphs Abs: 1.6 10*3/uL (ref 0.7–4.0)
MCV: 74.7 fl — ABNORMAL LOW (ref 78.0–100.0)
Monocytes Absolute: 0.6 10*3/uL (ref 0.1–1.0)
Monocytes Relative: 13.1 % — ABNORMAL HIGH (ref 3.0–12.0)
Neutrophils Relative %: 45.8 % (ref 43.0–77.0)
Platelets: 268 10*3/uL (ref 150.0–400.0)
RBC: 4.24 Mil/uL (ref 3.87–5.11)
WBC: 4.7 10*3/uL (ref 4.5–10.5)

## 2011-07-15 LAB — BASIC METABOLIC PANEL
BUN: 10 mg/dL (ref 6–23)
Calcium: 9 mg/dL (ref 8.4–10.5)
Creatinine, Ser: 0.8 mg/dL (ref 0.4–1.2)
GFR: 108.96 mL/min (ref >60.00)
Potassium: 3.6 mEq/L (ref 3.5–5.1)

## 2011-07-15 LAB — TSH: TSH: 1.93 u[IU]/mL (ref 0.35–5.50)

## 2011-07-15 LAB — HEMOGLOBIN A1C: Hgb A1c MFr Bld: 7.5 % — ABNORMAL HIGH (ref 4.6–6.5)

## 2011-07-15 LAB — HEPATIC FUNCTION PANEL
Albumin: 3.6 g/dL (ref 3.5–5.2)
Total Bilirubin: 0.4 mg/dL (ref 0.3–1.2)

## 2011-07-15 LAB — MICROALBUMIN / CREATININE URINE RATIO
Creatinine,U: 210.4 mg/dL
Microalb, Ur: 0.9 mg/dL (ref 0.0–1.9)

## 2011-07-15 LAB — LIPID PANEL: Total CHOL/HDL Ratio: 3

## 2011-07-16 ENCOUNTER — Other Ambulatory Visit: Payer: Self-pay | Admitting: Oncology

## 2011-07-16 ENCOUNTER — Encounter (HOSPITAL_BASED_OUTPATIENT_CLINIC_OR_DEPARTMENT_OTHER): Payer: BC Managed Care – PPO | Admitting: Oncology

## 2011-07-16 DIAGNOSIS — D392 Neoplasm of uncertain behavior of placenta: Secondary | ICD-10-CM

## 2011-07-16 LAB — CBC WITH DIFFERENTIAL/PLATELET
Basophils Absolute: 0 10*3/uL (ref 0.0–0.1)
Eosinophils Absolute: 0.2 10*3/uL (ref 0.0–0.5)
HCT: 31 % — ABNORMAL LOW (ref 34.8–46.6)
HGB: 9.9 g/dL — ABNORMAL LOW (ref 11.6–15.9)
LYMPH%: 27.9 % (ref 14.0–49.7)
MCV: 73.4 fL — ABNORMAL LOW (ref 79.5–101.0)
MONO%: 10.5 % (ref 0.0–14.0)
NEUT#: 3 10*3/uL (ref 1.5–6.5)
NEUT%: 57.5 % (ref 38.4–76.8)
Platelets: 230 10*3/uL (ref 145–400)
RDW: 21.2 % — ABNORMAL HIGH (ref 11.2–14.5)

## 2011-07-18 ENCOUNTER — Other Ambulatory Visit (HOSPITAL_COMMUNITY): Payer: Self-pay

## 2011-07-18 ENCOUNTER — Other Ambulatory Visit: Payer: Self-pay | Admitting: Family Medicine

## 2011-07-18 MED ORDER — GLUCOSE BLOOD VI STRP: ORAL_STRIP | Status: DC

## 2011-07-18 NOTE — Telephone Encounter (Signed)
Rx faxed.    KP 

## 2011-07-21 ENCOUNTER — Telehealth: Payer: Self-pay

## 2011-07-21 NOTE — Telephone Encounter (Signed)
mssg left to call the office    KP 

## 2011-07-21 NOTE — Telephone Encounter (Signed)
Pt is experiencing swelling in her feet since yesterday.  Pt would like to know what she needs to do.  Pls advise.

## 2011-07-21 NOTE — Telephone Encounter (Signed)
Probably from norvasc (amolodapine)---- really nothing to do ---if it is bad enough drop back down to 5 and we will need to add something different

## 2011-07-22 ENCOUNTER — Ambulatory Visit (HOSPITAL_COMMUNITY)
Admission: RE | Admit: 2011-07-22 | Discharge: 2011-07-22 | Disposition: A | Discharge status: Home / Self Care | Payer: BC Managed Care – PPO | Source: Ambulatory Visit | Attending: Oncology | Admitting: Oncology

## 2011-07-22 DIAGNOSIS — J984 Other disorders of lung: Secondary | ICD-10-CM | POA: Insufficient documentation

## 2011-07-22 DIAGNOSIS — C269 Malignant neoplasm of ill-defined sites within the digestive system: Secondary | ICD-10-CM | POA: Insufficient documentation

## 2011-07-22 DIAGNOSIS — O019 Hydatidiform mole, unspecified: Secondary | ICD-10-CM

## 2011-07-22 MED ORDER — IOHEXOL 300 MG/ML  SOLN
100.0000 mL | Freq: Once | INTRAMUSCULAR | Status: AC | PRN
  Administered 2011-07-22: 100 mL via INTRAVENOUS

## 2011-07-23 ENCOUNTER — Telehealth: Payer: Self-pay

## 2011-07-23 DIAGNOSIS — E119 Type 2 diabetes mellitus without complications: Secondary | ICD-10-CM

## 2011-07-23 DIAGNOSIS — E785 Hyperlipidemia, unspecified: Secondary | ICD-10-CM

## 2011-07-23 MED ORDER — METFORMIN HCL ER 500 MG PO TB24: 500.0000 mg | ORAL_TABLET | Freq: Every day | ORAL | Status: DC

## 2011-07-23 NOTE — Telephone Encounter (Signed)
Message copied by [name] on Wed Jul 23, 2011  8:55 AM ------      Message from: Lelon Perla      Created: Thu Jul 17, 2011  5:21 PM       + diabetic----- start glucophage xr 500 mg  #30  1 po qd  2 refills      Pt needs glucometer if she doesn't already have one and nutrition referral      Recheck 3 months-------272.4  250.00   Lipid, hep, bmp, hgba1c,  microalbumin

## 2011-07-23 NOTE — Telephone Encounter (Signed)
Discussed with patient and she voiced understanding. She already has a glucometer and has been checking her CBG's daily and is seeing a nutritionist. Rx faxed to the pharmacy---Lab orders put in     [place]

## 2011-07-24 NOTE — Telephone Encounter (Signed)
Discussed with patient and she stated her feet were just swelling that one day but it is better now. She will call if any further issues    KP

## 2011-07-25 ENCOUNTER — Ambulatory Visit (INDEPENDENT_AMBULATORY_CARE_PROVIDER_SITE_OTHER): Payer: BC Managed Care – PPO | Admitting: Family Medicine

## 2011-07-25 ENCOUNTER — Telehealth: Payer: Self-pay

## 2011-07-25 ENCOUNTER — Encounter: Payer: Self-pay | Admitting: Family Medicine

## 2011-07-25 DIAGNOSIS — E785 Hyperlipidemia, unspecified: Secondary | ICD-10-CM

## 2011-07-25 DIAGNOSIS — Z1231 Encounter for screening mammogram for malignant neoplasm of breast: Secondary | ICD-10-CM

## 2011-07-25 DIAGNOSIS — Z0000 Encounter for general adult medical examination without abnormal findings: Secondary | ICD-10-CM

## 2011-07-25 DIAGNOSIS — Z23 Encounter for immunization: Secondary | ICD-10-CM

## 2011-07-25 DIAGNOSIS — I10 Essential (primary) hypertension: Secondary | ICD-10-CM

## 2011-07-25 DIAGNOSIS — E119 Type 2 diabetes mellitus without complications: Secondary | ICD-10-CM

## 2011-07-25 MED ORDER — ONETOUCH DELICA LANCETS MISC: 1.0000 "application " | Freq: Two times a day (BID) | Status: AC

## 2011-07-25 MED ORDER — AMLODIPINE BESYLATE 10 MG PO TABS: 10.0000 mg | ORAL_TABLET | Freq: Every day | ORAL | Status: DC

## 2011-07-25 NOTE — Telephone Encounter (Signed)
Done  KP

## 2011-07-25 NOTE — Telephone Encounter (Signed)
I think I already put referral in for mammo----ok to put in referral for nutrition

## 2011-07-25 NOTE — Progress Notes (Signed)
  Subjective:     [name] is a 40 y.o. female who presents with new onset of Type 2 diabetes.. Current symptoms include: none. Patient denies foot ulcerations, hyperglycemia, hypoglycemia , increased appetite, nausea, paresthesia of the feet, polydipsia, polyuria, visual disturbances, vomiting and weight loss. Evaluation to date has been: fasting blood sugar, fasting lipid panel, hemoglobin A1C and microalbuminuria. Home sugars: she will start checking glucose regularly. Current treatments: Continued metformin which has been too early to assess effectiveness. Last dilated eye exam due.  HYPERTENSION Disease Monitoring Blood pressure range- needs new machine Chest pain- no     Dyspnea- no Medications Compliance- good Lightheadedness- no   Edema- no   DIABETES Disease Monitoring Blood Sugar ranges-110-160 Polyuria- no New Visual problems- no Medications Compliance- good Hypoglycemic symptoms- no   ROS See HPI above   PMH Smoking Status noted  The following portions of the patient's history were reviewed and updated as appropriate: allergies, current medications, past family history, past medical history, past social history, past surgical history and problem list.  [name] Pertinent items are noted in HPI.    Objective:    BP 132/86  Pulse 86  Temp(Src) 99 F (37.2 C) (Oral)  Wt 304 lb (137.893 kg)  SpO2 98% General appearance: alert, cooperative, appears stated age and no distress Eyes: conjunctivae/corneas clear. PERRL, EOM's intact. Fundi benign. Lungs: clear to auscultation bilaterally Heart: regular rate and rhythm, S1, S2 normal, no murmur, click, rub or gallop Extremities: extremities normal, atraumatic, no cyanosis or edema   Sensory exam of the foot is normal, tested with the monofilament. Good pulses, no lesions or ulcers, good peripheral pulses.   Patient was not evaluated for proper footwear and sizing.  Laboratory: No components found with  this basename: A1C      Assessment:    Diabetes mellitus Type II, under poor control.   HTN   Plan:    Discussed general issues about diabetes pathophysiology and management. Agricultural engineer distributed. Addressed ADA diet. Encouraged aerobic exercise. Discussed foot care. Reminded to get yearly retinal exam. Continued metformin; see  medication orders. Nutritionist referral. Reminded to bring in blood sugar diary at next visit. Follow up in 3 months or as needed.

## 2011-07-25 NOTE — Telephone Encounter (Signed)
Call from patient and she stated she decided to see the nutritionist and she wanted to get a mammogram. Please advise     KP

## 2011-07-25 NOTE — Patient Instructions (Signed)
Diabetes Meal Planning Guide The diabetes meal planning guide is a tool to help you plan your meals and snacks. It is important for people with diabetes to manage their blood sugar levels. Choosing the right foods and the right amounts throughout your day will help control your blood sugar. Eating right can even help you improve your blood pressure and reach or maintain a healthy weight. CARBOHYDRATE COUNTING MADE EASY When you eat carbohydrates, they turn to sugar (glucose). This raises your blood sugar level. Counting carbohydrates can help you control this level so you feel better. When you plan your meals by counting carbohydrates, you can have more flexibility in what you eat and balance your medicine with your food intake. Carbohydrate counting simply means adding up the total amount of carbohydrate grams (g) in your meals or snacks. Try to eat about the same amount at each meal. Foods with carbohydrates are listed below. Each portion below is 1 carbohydrate serving or 15 grams of carbohydrates. Ask your dietician how many grams of carbohydrates you should eat at each meal or snack. Grains and Starches 1 slice bread 1/2 English muffin or hotdog/hamburger bun 3/4 cup cold cereal (unsweetened) 1/3 cup cooked pasta or rice 1/2 cup starchy vegetables (corn, potatoes, peas, beans, winter squash) 1 tortilla (6 inches) 1/4 bagel 1 waffle or pancake (size of a CD) 1/2 cup cooked cereal 4 to 6 small crackers *Whole grain is recommended Fruit 1 cup fresh unsweetened berries, melon, papaya, pineapple 1 small fresh fruit 1/2 banana or mango 1/2 cup fruit juice (4 ounces unsweetened) 1/2 cup canned fruit in natural juice or water 2 tablespoons dried fruit 12 to 15 grapes or cherries Milk and Yogurt 1 cup fat-free or 1% milk 1 cup soy milk 6 ounces light yogurt with sugar-free sweetener 6 ounces low-fat soy yogurt 6 ounces plain yogurt Vegetables 1 cup raw or 1/2 cup cooked is counted as 0  carbohydrates or a "free" food. If you eat 3 or more servings at one meal, count them as 1 carbohydrate serving. Other Carbohydrates 3/4 ounces chips or pretzels 1/2 cup ice cream or frozen yogurt 1/4 cup sherbet or sorbet 2 inch square cake, no frosting 1 tablespoon honey, sugar, jam, jelly, or syrup 2 small cookies 3 squares of graham crackers 3 cups popcorn 6 crackers 1 cup broth-based soup Count 1 cup casserole or other mixed foods as 2 carbohydrate servings. Foods with less than 20 calories in a serving may be counted as 0 carbohydrates or a "free" food. You may want to purchase a book or computer software that lists the carbohydrate gram counts of different foods. In addition, the nutrition facts panel on the labels of the foods you eat are a good source of this information. The label will tell you how big the serving size is and the total number of carbohydrate grams you will be eating per serving. Divide this number by 15 to obtain the number of carbohydrate servings in a portion. Remember: 1 carbohydrate serving equals 15 grams of carbohydrate. SERVING SIZES Measuring foods and serving sizes helps you make sure you are getting the right amount of food. The list below tells how big or small some common serving sizes are.  1 ounce (oz) of cheese.................................4 stacked dice.   2 to 3 oz cooked meat..................................Deck of cards.   1 teaspoon (tsp)............................................Tip of little finger.   1 tablespoon (tbs).........................................Thumb.   2 tbs.............................................................Golf ball.    cup...........................................................Half of a fist.   1 cup............................................................A fist.    SAMPLE DIABETES MEAL PLAN Below is a sample meal plan that includes foods from the grain and starches, dairy, vegetable, fruit, and  meat groups. A dietician can individualize a meal plan to fit your calorie needs and tell you the number of servings needed from each food group. However, controlling the total amount of carbohydrates in your meal or snack is more important than making sure you include all of the food groups at every meal. You may interchange carbohydrate containing foods (dairy, starches, and fruits). The meal plan below is an example of a 2000 calorie diet using carbohydrate counting. This meal plan has 17 carbohydrate servings (carb choices). Breakfast 1 cup oatmeal (2 carb choices) 3/4 cup light yogurt (1 carb choice) 1 cup blueberries (1 carb choice) 1/4 cup almonds  Snack 1 large apple (2 carb choices) 1 low-fat string cheese stick  Lunch Chicken breast salad:  1 cup spinach   1/4 cup chopped tomatoes   2 oz chicken breast, sliced   2 tbs low-fat Italian dressing  12 whole-wheat crackers (2 carb choices) 12 to 15 grapes (1 carb choice) 1 cup low-fat milk (1 carb choice)  Snack 1 cup carrots 1/2 cup hummus (1 carb choice)  Dinner 3 oz broiled salmon 1 cup brown rice (3 carb choices)  Snack 1 1/2 cups steamed broccoli (1 carb choice) drizzled with 1 tsp olive oil and lemon juice 1 cup light pudding (2 carb choices)  DIABETES MEAL PLANNING WORKSHEET Your dietician can use this worksheet to help you decide how many servings of foods and what types of foods are right for you.  Breakfast Food Group and Servings Carb Choices Grain/Starches _______________________________________ Dairy ______________________________________________ Vegetable _______________________________________ Fruit _______________________________________________ Meat _______________________________________________ Fat _____________________________________________ Lunch Food Group and Servings Carb Choices Grain/Starches ________________________________________ Dairy _______________________________________________ Fruit  ________________________________________________ Meat ________________________________________________ Fat _____________________________________________ Dinner Food Group and Servings Carb Choices Grain/Starches ________________________________________ Dairy _______________________________________________ Fruit ________________________________________________ Meat ________________________________________________ Fat _____________________________________________ Snacks Food Group and Servings Carb Choices Grain/Starches ________________________________________ Dairy _______________________________________________ Vegetable ________________________________________ Fruit ________________________________________________ Meat ________________________________________________ Fat _____________________________________________ Daily Totals Starches _________________________ Vegetable __________________________ Fruit ______________________________ Dairy ______________________________ Meat ______________________________ Fat ________________________________  Document Released: 07/10/2005 Document Re-Released: 04/02/2010 [employer] Patient Information 2011 [employer]. 

## 2011-07-29 NOTE — Discharge Summary (Signed)
NAMECAMI, [name]               ACCOUNT NO.:  [id number]  MEDICAL RECORD NO.:  [id number]  LOCATION:  1321                         FACILITY:  [facility]  PHYSICIAN:  Reece Packer, M.D. 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  For details of her presentation and course to this point, see the dictated H and P, review of systems, past medical history, family history, and social history as in the dictated H and P.  PHYSICAL EXAMINATION:  VITAL SIGNS:  On admission, weight 302 pounds, height 70 inches, BSA capped to 2 m2 per recommendation of GYN Oncology, temperature 98.2, heart rate 124 and regular, respirations 18, blood pressure 120/84, O2 saturation 96%. HEENT:  Nonicteric.  Oropharynx clear.  Pupils equal and reactive. LUNGS:  Clear.  No vertebral tenderness. HEART:  Tachycardic.  No murmur, rub, or gallop. ABDOMEN:  Soft, obese, nontender, not distended.  No masses or organomegaly.  She has no edema or point tenderness in the calves.  No palpable cords.  Left upper extremity PICC line not remarkable.  No swelling left upper extremity.  Right upper extremity nontender, full range of motion, and no swelling, that the side of the previous DVT.  LABORATORY DATA:  From June 09, 2011, white count 8.6, ANC 5.9, hemoglobin 9.1, platelets 181; INR 2.7 that day.  Beta HCG less than 2. Sodium 135, potassium 3.3 which will be supplemented, chloride 103, CO2 of 21, glucose 222, BUN 13, creatinine 0.7.  Total bilirubin 0.6, alk phos 59, GOT 22, GPT 18, total protein 6.7, albumin 3.5, calcium 8.7. Admission INR 3.1, such that Coumadin was held day of admission.  Chest x-ray on admission, normal heart size, no heart failure, no  infiltrate or effusion, left PICC line in superior vena cava, no clearly evident lung nodules, though there is a questionable of a 4 mm right upper lobe nodule on this plain chest x-ray.  COURSE IN HOSPITAL:  The patient was admitted to the oncology unit and began on chemotherapy with day #1 and day #2 cycle 6 EMA-CO as follows: VP-16 100 mg/m2 equals 200 mg on day #1 and day #2, dactinomycin 0.5 mg day #1 and day #2, methotrexate 100 mg/m2 equals 200 mg bolus on day #1, then methotrexate 200 mg/m2 equals 400 mg continuous infusion over 12 hours beginning after the other chemotherapy on day #1.  Leucovorin rescue was began following completion of the methotrexate as previously. CBC repeated on August 16th, white count 7.5, hemoglobin 8.4 with hydration, platelets 187.  She was seen in consultation by Nutrition.  I was following the elevated blood sugars, which she has had during this chemotherapy and had seemed related to steroids and the necessary IV D5W.  Hemoglobin A1c available after discharge was elevated at 8.3 and we will need her to follow this up with her primary.  She had some difficulty getting tangled in the IV lines in the bathroom, tripped with some nonsignificant  contusion left arm and left face.  She also bumped her head on the cabinet over the sink, again fortunately with no significant residual.  INR by August  17th was 2.3 and Coumadin was dosed to 7.5 mg on August 17th, then 5 mg on August 18 and August 19 with followup PT/INR at the cancer center scheduled for August 20th. Other labs on August 17th, sodium 135, potassium 4, chloride 98, CO2 of 27, glucose 312, BUN 12, creatinine 0.6 (that glucose with continued D5W necessary for the chemotherapy and steroids).  Urinalysis was negative for blood.  MEDICATIONS AT DISCHARGE:  As listed and include, 1. Zofran 8 mg q.6 h. p.r.n. 2. Coumadin, as above. 3. Leucovorin rescue q.12 h. as noted. 4. Restoril 30 mg at h.s.  p.r.n. 5. Protonix 40 mg daily. 6. Lorazepam 1 mg q.6 h. p.r.n.  Followup will be as scheduled at the cancer center with PT/INR labs and day #8 cycle 6 EMA-CO this coming week.  IMPRESSION AND PLAN: 1. 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 2. More elevated blood sugars as she has gone through the     chemotherapy, which may need additional evaluation after treatments     are completed. 3. PICC line in place. 4. Deep vein thrombosis, upper extremity site of the first PICC line,     continuing Coumadin which is planned through mid October. 5. Significant gastroesophageal reflux disease symptoms previously,     resolved with Protonix. 6. Hypertension, followed by Dr. Loreen Freud. 7. Some minimal trauma, related to getting tangled in the IV lines and     bumping her head on cabinet over the sink during this     hospitalization, no significant injury. 8. Anemia, related to previous GYN bleeding and present chemotherapy:     Continue oral iron.  DIET:  To be no concentrated sweets.  She is to push fluids.  ACTIVITY:  As tolerated.  Follow up with Dr. Darrold Span as above.  CONDITION ON DISCHARGE:  Stable.     Reece Packer, M.D.     LPL/MEDQ  D:  07/09/2011  T:  07/09/2011  Job:  [id number]  cc:   Laurette Schimke, MD  Lelon Perla, DO [address] [place], [place] [address]  Electronically Signed by Jama Flavors M.D. on 07/29/2011 08:44:45 AM

## 2011-08-11 ENCOUNTER — Other Ambulatory Visit: Payer: Self-pay | Admitting: Oncology

## 2011-08-11 ENCOUNTER — Encounter (HOSPITAL_BASED_OUTPATIENT_CLINIC_OR_DEPARTMENT_OTHER): Payer: BC Managed Care – PPO | Admitting: Oncology

## 2011-08-11 DIAGNOSIS — Z452 Encounter for adjustment and management of vascular access device: Secondary | ICD-10-CM

## 2011-08-11 DIAGNOSIS — D392 Neoplasm of uncertain behavior of placenta: Secondary | ICD-10-CM

## 2011-08-11 LAB — CBC WITH DIFFERENTIAL/PLATELET
Basophils Absolute: 0 10*3/uL (ref 0.0–0.1)
Eosinophils Absolute: 0.1 10*3/uL (ref 0.0–0.5)
HCT: 31.6 % — ABNORMAL LOW (ref 34.8–46.6)
LYMPH%: 40.6 % (ref 14.0–49.7)
MCV: 72.4 fL — ABNORMAL LOW (ref 79.5–101.0)
MONO#: 0.5 10*3/uL (ref 0.1–0.9)
NEUT#: 2.5 10*3/uL (ref 1.5–6.5)
NEUT%: 47.2 % (ref 38.4–76.8)
Platelets: 250 10*3/uL (ref 145–400)
WBC: 5.4 10*3/uL (ref 3.9–10.3)

## 2011-08-11 LAB — COMPREHENSIVE METABOLIC PANEL
Albumin: 3.9 g/dL (ref 3.5–5.2)
Alkaline Phosphatase: 33 U/L — ABNORMAL LOW (ref 39–117)
CO2: 23 mEq/L (ref 19–32)
Calcium: 9.3 mg/dL (ref 8.4–10.5)
Chloride: 107 mEq/L (ref 96–112)
Glucose, Bld: 93 mg/dL (ref 70–99)
Potassium: 3.8 mEq/L (ref 3.5–5.3)
Sodium: 139 mEq/L (ref 135–145)
Total Protein: 7.5 g/dL (ref 6.0–8.3)

## 2011-08-11 LAB — HCG, QUANTITATIVE, PREGNANCY: hCG, Beta Chain, Quant, S: <2 m[IU]/mL

## 2011-08-12 ENCOUNTER — Ambulatory Visit
Admission: RE | Admit: 2011-08-12 | Discharge: 2011-08-12 | Disposition: A | Discharge status: Home / Self Care | Payer: BC Managed Care – PPO | Source: Ambulatory Visit | Attending: Family Medicine | Admitting: Family Medicine

## 2011-08-12 DIAGNOSIS — Z1231 Encounter for screening mammogram for malignant neoplasm of breast: Secondary | ICD-10-CM

## 2011-08-13 ENCOUNTER — Telehealth: Payer: Self-pay

## 2011-08-13 NOTE — Telephone Encounter (Signed)
Her range?  You mean what should they be?   Fasting under as close to 100 as possibel and 2 hours PP  120-130

## 2011-08-13 NOTE — Telephone Encounter (Signed)
Call from patient and she wanted to know what her CBG range is so. Please advise   KP

## 2011-08-13 NOTE — Telephone Encounter (Signed)
discussed with patient and she voiced understanding    KP

## 2011-08-14 ENCOUNTER — Other Ambulatory Visit: Payer: Self-pay | Admitting: Family Medicine

## 2011-08-14 DIAGNOSIS — R928 Other abnormal and inconclusive findings on diagnostic imaging of breast: Secondary | ICD-10-CM

## 2011-08-18 ENCOUNTER — Other Ambulatory Visit: Payer: Self-pay | Admitting: Physician Assistant

## 2011-08-18 ENCOUNTER — Encounter (HOSPITAL_BASED_OUTPATIENT_CLINIC_OR_DEPARTMENT_OTHER): Payer: Medicaid - Dental | Admitting: Oncology

## 2011-08-18 DIAGNOSIS — Z452 Encounter for adjustment and management of vascular access device: Secondary | ICD-10-CM

## 2011-08-18 DIAGNOSIS — D392 Neoplasm of uncertain behavior of placenta: Secondary | ICD-10-CM

## 2011-08-18 DIAGNOSIS — Z5189 Encounter for other specified aftercare: Secondary | ICD-10-CM

## 2011-08-18 LAB — PROTIME-INR: INR: 1.4 — ABNORMAL LOW (ref 2.00–3.50)

## 2011-08-18 LAB — CBC WITH DIFFERENTIAL/PLATELET
Basophils Absolute: 0.1 10*3/uL (ref 0.0–0.1)
HCT: 32.9 % — ABNORMAL LOW (ref 34.8–46.6)
HGB: 10.2 g/dL — ABNORMAL LOW (ref 11.6–15.9)
MCH: 21.8 pg — ABNORMAL LOW (ref 25.1–34.0)
MONO#: 0.4 10*3/uL (ref 0.1–0.9)
NEUT%: 39.3 % (ref 38.4–76.8)
WBC: 5.5 10*3/uL (ref 3.9–10.3)
lymph#: 2.8 10*3/uL (ref 0.9–3.3)

## 2011-08-25 ENCOUNTER — Encounter: Payer: Self-pay | Admitting: Dietician

## 2011-08-25 ENCOUNTER — Encounter: Payer: BC Managed Care – PPO | Attending: Family Medicine | Admitting: Dietician

## 2011-08-25 DIAGNOSIS — Z713 Dietary counseling and surveillance: Secondary | ICD-10-CM | POA: Insufficient documentation

## 2011-08-25 DIAGNOSIS — E119 Type 2 diabetes mellitus without complications: Secondary | ICD-10-CM | POA: Insufficient documentation

## 2011-08-25 NOTE — Patient Instructions (Addendum)
Goal:  To get off the diabetes medicine.    For 2-3 times get a blood glucose when you wake up at 3:00 AM.  Continue to try to limit eating out.  Keep up the snack at 3:00 PM and then dinner at 6:00-6:00 PM  Cut the amount of juice to 2-4 oz with the iron tablets in the morning and the afternoon.    Try to decrease the amount of syrup on the pancakes.  Continue to use the Splenda for sweetening.  Try to keep the carb intake at 45-60 gm for meals and 30-45 gm for the large afternoon snack and 30 for the nighttime snack. Use the meal plans for examples and the snack list.  Try to use fruits rather than the juice.  Use foods with fiber as often as possible

## 2011-08-25 NOTE — Progress Notes (Signed)
  Medical Nutrition Therapy:  Appt start time: 1500 end time:  1630.  Assessment:  Primary concerns today: Wants to get off the medication for diabetes.  Wanting to eat right.  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  Intensive chemo using a pix line.  Pix line is out and she is no longer on Coumadin.  Currently on Depo for prevention of periods.  Having sweet cravings and diet has been high in carbohydrate.  Trying to limit sweets since the diagnosis of DM.  Her mother has a history of type 2 DM.      BLOOD GLUCOSE:  Monitors 2 times per day.  Fasting without the 3 AM snack:105-114 mg.  Fasting with the 3 AM snack 120-125.  This is about 3-3.5 hours after the snack.  Her 2 hour post-meal glucose levels are at 125-130-140 mg. (This time is counted following finishing the meal.)  MEDICATIONS: Coumadin d/c about 1.5 weeks ago.  See medication list.  DIETARY INTAKE:  24-hr recall:  B (11:00 AM): B:  Pancakes 6 in 3, eggs 2 scrambled  Bacon 2 slices or cereal, raisin bran  2 cup, milk 8 oz, banana1 small  OR burger 4 oz., fries1-1.5 cup fries them. , bun 1, mustard, ketchup  2 tabsp, onion, mayo.  Orange juice 12 oz. Snk (3-5:00 PM): sandwich and chips (ham or [place], bread 2 white wheat bread, 100 calories for 2 slices or potato bread) mayo, lettuce, tomato, onion. D (6:30-7:00 PM): Baled chicken, mash potatoes, broccoli, steak, grilled or broiled; baked potato, broccoli or mixed veggie. Snk (PM): Usually not eating after 8 PM, Recently waking up at 3:00AM and having a snack.  Most of the time milk and cookies.  Pills are now not working, she is not sleeping through the night. Beverages: Orange juice, water.  Water is 4 (16.9 oz per day) .  Recent physical activity: Just started riding an exercise bike.  Two times rode for 10 minutes.  Estimated energy needs: Post-chemo and with type 2 DM.  Weight loss and glucose control and post-chemo  aim for about 1700-1800 calories per day.   1700-1800 calories 200-205 g carbohydrates  (To aim for) 130-135 g protein 45-50 g fat Note that I have advised her to aim at 45-60 gm for breakfast and dinner and 30-45 for the afternoon snack and to try to keep the 3 am snack at 15-30 gm.  Would like to see her be able to sleep.  I'm having her check the 3 AM glucose to see if possibly the glucose is trending low and waking her up.  Progress Towards Goal(s):  Some progress.   Nutritional Diagnosis:  [place]-2.1 Inpaired nutrition utilization As related to glucose.  As evidenced by HgA1C of 7.5%..    Intervention:  Nutrition Review of diabetes and the use of a carbohydrate restricted diet for controlling blood glucose levels.  Increased use of fiber foods in the form of the whole grains, the green and  Non-starchy vegetables and the lean meats.    Handouts given during visit include:  Handout (modified Living With Diabetes0  Yellow Card diet suggested prescription with the carbohydrate counts.  Handout of various Iron Sources and the use of citric acid foods to enhance the uptake of iron.  Novo Nordisk Carbohydrate Counting Guide   Monitoring/Evaluation:  Dietary intake, exercise, blood glucose levels, and body weight in December following appointment with Dr. Laury Axon.

## 2011-08-27 ENCOUNTER — Encounter (HOSPITAL_BASED_OUTPATIENT_CLINIC_OR_DEPARTMENT_OTHER): Payer: BC Managed Care – PPO | Admitting: Oncology

## 2011-08-27 ENCOUNTER — Other Ambulatory Visit: Payer: Self-pay | Admitting: Oncology

## 2011-08-27 DIAGNOSIS — I82609 Acute embolism and thrombosis of unspecified veins of unspecified upper extremity: Secondary | ICD-10-CM

## 2011-08-27 DIAGNOSIS — D392 Neoplasm of uncertain behavior of placenta: Secondary | ICD-10-CM

## 2011-08-27 DIAGNOSIS — C55 Malignant neoplasm of uterus, part unspecified: Secondary | ICD-10-CM

## 2011-08-27 DIAGNOSIS — O019 Hydatidiform mole, unspecified: Secondary | ICD-10-CM

## 2011-08-28 ENCOUNTER — Ambulatory Visit
Admission: RE | Admit: 2011-08-28 | Discharge: 2011-08-28 | Disposition: A | Discharge status: Home / Self Care | Payer: BC Managed Care – PPO | Source: Ambulatory Visit | Attending: Oncology | Admitting: Oncology

## 2011-08-28 ENCOUNTER — Other Ambulatory Visit: Payer: Self-pay | Admitting: Oncology

## 2011-08-28 ENCOUNTER — Other Ambulatory Visit: Payer: Self-pay | Admitting: Family Medicine

## 2011-08-28 ENCOUNTER — Ambulatory Visit
Admission: RE | Admit: 2011-08-28 | Discharge: 2011-08-28 | Disposition: A | Discharge status: Home / Self Care | Payer: BC Managed Care – PPO | Source: Ambulatory Visit | Attending: Family Medicine | Admitting: Family Medicine

## 2011-08-28 DIAGNOSIS — N63 Unspecified lump in unspecified breast: Secondary | ICD-10-CM

## 2011-08-28 DIAGNOSIS — R921 Mammographic calcification found on diagnostic imaging of breast: Secondary | ICD-10-CM

## 2011-08-28 DIAGNOSIS — C55 Malignant neoplasm of uterus, part unspecified: Secondary | ICD-10-CM

## 2011-08-28 DIAGNOSIS — O019 Hydatidiform mole, unspecified: Secondary | ICD-10-CM

## 2011-08-28 DIAGNOSIS — R928 Other abnormal and inconclusive findings on diagnostic imaging of breast: Secondary | ICD-10-CM

## 2011-09-01 ENCOUNTER — Other Ambulatory Visit: Payer: Self-pay | Admitting: Oncology

## 2011-09-02 ENCOUNTER — Telehealth: Payer: Self-pay

## 2011-09-02 NOTE — Telephone Encounter (Signed)
SPOKE WITH MS. Ose AND TOLD HER THAT DR. Darrold Span FEELS THAT SHE NEEDS TO GO AHEAD WITH THE RIGHT BREAST BIOPSY SINCE THE RADIOLOGIST FEEL IT IS INDICATED TO DETERMINE DX OF THE AREA OF CONCERN ON IMAGES. THE BX IS SET UP FOR 09-10-11 PER PATIENT.

## 2011-09-03 ENCOUNTER — Telehealth: Payer: Self-pay

## 2011-09-03 NOTE — Telephone Encounter (Signed)
FAXED THE COMPLETED SHORT TERM DISABILITY FORM DATED 09-02-11 TO MS. Waynette Buttery. SENT A COPY TO MEDICAL RECORDS TO BE SCANNED INTO PT.'S EMR. HAVE PT.'S FOLDER OF FORMS TO GIVE BACK TO HER AT NEXT VISIT.

## 2011-09-08 ENCOUNTER — Other Ambulatory Visit: Payer: Self-pay | Admitting: Oncology

## 2011-09-08 ENCOUNTER — Telehealth: Payer: Self-pay | Admitting: Oncology

## 2011-09-08 ENCOUNTER — Other Ambulatory Visit (HOSPITAL_BASED_OUTPATIENT_CLINIC_OR_DEPARTMENT_OTHER): Payer: BC Managed Care – PPO | Admitting: Lab

## 2011-09-08 DIAGNOSIS — D392 Neoplasm of uncertain behavior of placenta: Secondary | ICD-10-CM

## 2011-09-08 DIAGNOSIS — Z452 Encounter for adjustment and management of vascular access device: Secondary | ICD-10-CM

## 2011-09-08 DIAGNOSIS — Z5189 Encounter for other specified aftercare: Secondary | ICD-10-CM

## 2011-09-08 LAB — CBC WITH DIFFERENTIAL/PLATELET
Basophils Absolute: 0 10*3/uL (ref 0.0–0.1)
Eosinophils Absolute: 0.1 10*3/uL (ref 0.0–0.5)
HGB: 10.7 g/dL — ABNORMAL LOW (ref 11.6–15.9)
MONO#: 0.3 10*3/uL (ref 0.1–0.9)
NEUT#: 2.5 10*3/uL (ref 1.5–6.5)
RDW: 16.7 % — ABNORMAL HIGH (ref 11.2–14.5)
lymph#: 2.6 10*3/uL (ref 0.9–3.3)

## 2011-09-08 LAB — TECHNOLOGIST REVIEW

## 2011-09-08 LAB — BASIC METABOLIC PANEL
CO2: 23 mEq/L (ref 19–32)
Calcium: 9.5 mg/dL (ref 8.4–10.5)
Chloride: 107 mEq/L (ref 96–112)
Sodium: 140 mEq/L (ref 135–145)

## 2011-09-08 NOTE — Telephone Encounter (Signed)
gve the pt her dec 2012 appt calendar along with the ct scan appt °

## 2011-09-09 ENCOUNTER — Telehealth: Payer: Self-pay

## 2011-09-09 NOTE — Telephone Encounter (Signed)
Message copied by Lorine Bears on Tue Sep 09, 2011  9:40 AM ------      Message from: Reece Packer      Created: Tue Sep 09, 2011  9:17 AM      Regarding: BHCG        Please let Junior Treptow know BHCG this week was good at <2            ----- Message -----         From: [facility] Interface         Sent: 09/08/2011   1:00 PM           To: Reece Packer, MD

## 2011-09-10 ENCOUNTER — Ambulatory Visit
Admission: RE | Admit: 2011-09-10 | Discharge: 2011-09-10 | Disposition: A | Discharge status: Home / Self Care | Payer: BC Managed Care – PPO | Source: Ambulatory Visit | Attending: Family Medicine | Admitting: Family Medicine

## 2011-09-10 ENCOUNTER — Other Ambulatory Visit: Payer: Self-pay | Admitting: Diagnostic Radiology

## 2011-09-10 DIAGNOSIS — N63 Unspecified lump in unspecified breast: Secondary | ICD-10-CM

## 2011-09-10 DIAGNOSIS — R921 Mammographic calcification found on diagnostic imaging of breast: Secondary | ICD-10-CM

## 2011-09-11 ENCOUNTER — Other Ambulatory Visit: Payer: Self-pay | Admitting: Family Medicine

## 2011-09-11 ENCOUNTER — Ambulatory Visit
Admission: RE | Admit: 2011-09-11 | Discharge: 2011-09-11 | Disposition: A | Discharge status: Home / Self Care | Payer: BC Managed Care – PPO | Source: Ambulatory Visit | Attending: Family Medicine | Admitting: Family Medicine

## 2011-09-11 DIAGNOSIS — N63 Unspecified lump in unspecified breast: Secondary | ICD-10-CM

## 2011-09-26 ENCOUNTER — Telehealth: Payer: Self-pay | Admitting: Oncology

## 2011-09-26 NOTE — Telephone Encounter (Signed)
Per gyn onc, previous CT findings with normal BHCG reviewed with Dr.John Kyla Balzarine. He suggests monthly HCG x 18 months rather than usual 12 months. Will be best to do these at [facility]. Note pt is to see LL 10-01-11 following repeat CT, so can set lab up then.

## 2011-09-30 ENCOUNTER — Other Ambulatory Visit (INDEPENDENT_AMBULATORY_CARE_PROVIDER_SITE_OTHER): Payer: BC Managed Care – PPO

## 2011-09-30 ENCOUNTER — Ambulatory Visit (HOSPITAL_COMMUNITY)
Admission: RE | Admit: 2011-09-30 | Discharge: 2011-09-30 | Disposition: A | Discharge status: Home / Self Care | Payer: BC Managed Care – PPO | Source: Ambulatory Visit | Attending: Oncology | Admitting: Oncology

## 2011-09-30 DIAGNOSIS — C55 Malignant neoplasm of uterus, part unspecified: Secondary | ICD-10-CM

## 2011-09-30 DIAGNOSIS — O019 Hydatidiform mole, unspecified: Secondary | ICD-10-CM | POA: Insufficient documentation

## 2011-09-30 DIAGNOSIS — E785 Hyperlipidemia, unspecified: Secondary | ICD-10-CM

## 2011-09-30 DIAGNOSIS — R911 Solitary pulmonary nodule: Secondary | ICD-10-CM | POA: Insufficient documentation

## 2011-09-30 DIAGNOSIS — R059 Cough, unspecified: Secondary | ICD-10-CM | POA: Insufficient documentation

## 2011-09-30 DIAGNOSIS — I517 Cardiomegaly: Secondary | ICD-10-CM | POA: Insufficient documentation

## 2011-09-30 DIAGNOSIS — E119 Type 2 diabetes mellitus without complications: Secondary | ICD-10-CM

## 2011-09-30 DIAGNOSIS — R05 Cough: Secondary | ICD-10-CM | POA: Insufficient documentation

## 2011-09-30 LAB — BASIC METABOLIC PANEL
Chloride: 108 mEq/L (ref 96–112)
Creatinine, Ser: 0.9 mg/dL (ref 0.4–1.2)
GFR: 90.51 mL/min (ref >60.00)

## 2011-09-30 LAB — HEMOGLOBIN A1C: Hgb A1c MFr Bld: 6.5 % (ref 4.6–6.5)

## 2011-09-30 LAB — LIPID PANEL
HDL: 48.7 mg/dL (ref >39.00)
LDL Cholesterol: 79 mg/dL (ref 0–99)
Total CHOL/HDL Ratio: 3
Triglycerides: 56 mg/dL (ref 0.0–149.0)

## 2011-09-30 LAB — MICROALBUMIN / CREATININE URINE RATIO
Creatinine,U: 315.2 mg/dL
Microalb, Ur: 1.1 mg/dL (ref 0.0–1.9)

## 2011-09-30 LAB — HEPATIC FUNCTION PANEL
AST: 17 U/L (ref 0–37)
Albumin: 4 g/dL (ref 3.5–5.2)

## 2011-09-30 MED ORDER — IOHEXOL 300 MG/ML  SOLN: 80.0000 mL | Freq: Once | INTRAMUSCULAR | Status: AC | PRN

## 2011-09-30 NOTE — Progress Notes (Signed)
12  

## 2011-10-01 ENCOUNTER — Other Ambulatory Visit: Payer: Self-pay | Admitting: Oncology

## 2011-10-01 ENCOUNTER — Telehealth: Payer: Self-pay

## 2011-10-01 ENCOUNTER — Encounter: Payer: Self-pay | Admitting: *Deleted

## 2011-10-01 ENCOUNTER — Ambulatory Visit (HOSPITAL_BASED_OUTPATIENT_CLINIC_OR_DEPARTMENT_OTHER): Payer: BC Managed Care – PPO | Admitting: Oncology

## 2011-10-01 ENCOUNTER — Other Ambulatory Visit (HOSPITAL_BASED_OUTPATIENT_CLINIC_OR_DEPARTMENT_OTHER): Payer: BC Managed Care – PPO | Admitting: Lab

## 2011-10-01 ENCOUNTER — Telehealth: Payer: Self-pay | Admitting: Oncology

## 2011-10-01 VITALS — BP 109/71 | HR 88 | Temp 98.7°F | Ht 70.0 in | Wt 298.0 lb

## 2011-10-01 DIAGNOSIS — D392 Neoplasm of uncertain behavior of placenta: Secondary | ICD-10-CM

## 2011-10-01 DIAGNOSIS — O019 Hydatidiform mole, unspecified: Secondary | ICD-10-CM

## 2011-10-01 DIAGNOSIS — Z5189 Encounter for other specified aftercare: Secondary | ICD-10-CM

## 2011-10-01 DIAGNOSIS — Z452 Encounter for adjustment and management of vascular access device: Secondary | ICD-10-CM

## 2011-10-01 LAB — CBC WITH DIFFERENTIAL/PLATELET
Basophils Absolute: 0 10*3/uL (ref 0.0–0.1)
Eosinophils Absolute: 0.1 10*3/uL (ref 0.0–0.5)
HGB: 11.2 g/dL — ABNORMAL LOW (ref 11.6–15.9)
MCV: 69.9 fL — ABNORMAL LOW (ref 79.5–101.0)
MONO#: 0.4 10*3/uL (ref 0.1–0.9)
NEUT#: 2.5 10*3/uL (ref 1.5–6.5)
RBC: 5.02 10*6/uL (ref 3.70–5.45)
RDW: 17.3 % — ABNORMAL HIGH (ref 11.2–14.5)
WBC: 4.8 10*3/uL (ref 3.9–10.3)
lymph#: 1.8 10*3/uL (ref 0.9–3.3)

## 2011-10-01 LAB — HCG, QUANTITATIVE, PREGNANCY: hCG, Beta Chain, Quant, S: <2 m[IU]/mL

## 2011-10-01 MED ORDER — MEDROXYPROGESTERONE ACETATE 150 MG/ML IM SUSP
150.0000 mg | Freq: Once | INTRAMUSCULAR | Status: AC
  Administered 2011-10-01: 150 mg via INTRAMUSCULAR
  Filled 2011-10-01: qty 1

## 2011-10-01 NOTE — Telephone Encounter (Signed)
Message copied by Lorine Bears on Wed Oct 01, 2011  5:27 PM ------      Message from: Jama Flavors P      Created: Wed Oct 01, 2011  5:00 PM       Labs seen and need follow up  Please let Ms Amir know BHCG in good range, <2.0

## 2011-10-01 NOTE — Telephone Encounter (Signed)
gve the pt her jan-June 2013 appt calendar along with the appt with dr clance. Pt is aware i will call her wioth the appt to see dr Nelly Rout.

## 2011-10-01 NOTE — Telephone Encounter (Signed)
Message copied by Lorine Bears on Wed Oct 01, 2011  5:26 PM ------      Message from: Jama Flavors P      Created: Wed Oct 01, 2011  5:00 PM       Labs seen and need follow up  Please let Ms Dirosa know BHCG in good range, <2.0

## 2011-10-01 NOTE — Progress Notes (Signed)
CSW received referral for exercise programs for pt's.  CSW contacted pt and provide information on the [employer] program and the [employer].  CSW gave pt the contact person for the program and their contact information.  CSW will also mail information pamphlet to pt.  Please contact CSW with any other needs or concerns.  Tamala Julian, LCSW

## 2011-10-01 NOTE — Telephone Encounter (Signed)
FAXED COMPLETED STD FORM TO [facility].  SENT A COPY TO MEDICAL RECORDS TO BE SCANNED INTO PT.'S EMR.

## 2011-10-01 NOTE — Telephone Encounter (Signed)
P 

## 2011-10-02 NOTE — Progress Notes (Signed)
Received CT scan on pt.- given to MD.

## 2011-10-02 NOTE — Progress Notes (Signed)
OFFICE PROGRESS NOTE INTERVAL HISTORY:   Date of Visit Oct 01, 2011 Physicians: W.Nelly Rout, Y.Lowne, N.Dillard. 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 Since I had seen her last in late Oct., Dr.John Kyla Balzarine has reviewed situatiion including the previous chest CT scan 07-22-11 (not chest CT done just prior to visit today), and has recommended continuing BHCG monthly x 18 months rather than usual 12 months. Prior to that information, I had repeated chest CT at [facility] on Dec4, report below showing only stable 3mm RUL nodule and no mention of the smaller RUL nodule or the lingular nodule seen on Sept. CT. This is reassuring and discussed with patient now; I have also told her that likely [name] will have the BHCG followed for 18 months even with this. History is of presumed pregnancy in Jan. 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 [name] had rising BHCG after the D&C which did not improve significantly with initial methotrexate. CT chest/abd/pelvis 03-20-11 had small pulmonary nodules (at least 4 in left and at least 3 on right) as well as large intrauterine mass. [name] was seen by Dr.Brewster and begun on EMA-CO as above. 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 Last BHCG done 09-08-11 was still <2.0; her last depo-Provera was given 07-16-11. Course was also complicated by DVT at site of initial RUE PICC, 3 months of coumadin completed in midOct 2012.   Tyshell is gradually improving overall since completion of the chemotherapy, tho [name] still has poor exercise tolerance, slight numbness persisting in fingertips and  toes, and still is having much difficulty sleeping at night. [name] has begun walking x2 around her cul-de-sac once daily and I have encouraged her to increase this. [name] goes to bed ~ 10pm, wakes 0300, is unable to go back to sleep until ~ 0900 when [name] then sleeps for 3 hrs. Restoril has no longer been helpful and [name] has stopped this. [name] has not discussed sleep problems with Dr.Lowne; I have encouraged her to stop napping during day, however [name] feels that the pattern has been going on for so long (since early 2012) that [name] will not be able to resolve it herself. I have suggested sleep consult by Dr.Clance and [name] does want this referral.  [name] also had biopsy of areas of calcifications right breast seen on screening mammogram at [facility]  08-12-11, fortunately path benign and next mammograms recommended in 1 year.  [name] otherwise: no nausea or vomiting, good po intake now. Nasal congestion and clear drainage x several days consistent with viral type URI, daughter with same symptoms. No bleeding or swelling UE or LE. No significant cough, hemoptysis, no increased SOB tho this is still noticeable with exertion such as walking outdoors as above. No pain. No HA/ other neuro symptoms.   Objective:  Vital signs in last 24 hours:  BP 109/71  Pulse 88  Temp(Src) 98.7 F (37.1 C) (Oral)  Ht 5\' 10"  (1.778 m)  Wt 298 lb (135.172 kg)  BMI 42.76 kg/m2  Alert, nasally congested but not acutely ill, looks better overall even with the URI. Easily ambulatory in office.  HEENT:mucous membranes moist, pharynx normal without lesions and mucous membranes moist and clear. Posterior pharynx with minimal erythema, no exudate. Nasal turbinates boggy without purulent drainage. PERR, not icteric.  LymphaticsCervical, supraclavicular, and axillary nodes normal. Resp: clear to auscultation bilaterally and normal percussion bilaterally Cardio: regular rate and rhythm GI: soft, non-tender; bowel  sounds normal; no masses,  no organomegaly, obese. Extremities: extremities normal, atraumatic, no cyanosis or edema including RUE. Neuro:decreased sensation light touch fingertips otherwise nonfocal.  PICC out  Lab Results:   [facility] 10/01/11 0908  WBC 4.8  HGB 11.2*  HCT 35.1  PLT 180    BMET  Basename 09/30/11 0832  NA 141  K 3.6  CL 108  CO2 25  GLUCOSE 87  BUN 13  CREATININE 0.9  CALCIUM 9.5   BHCG available after visit <2.0  Studies/Results:  Ct Chest W Contrast  09/30/2011  *RADIOLOGY REPORT*  Clinical Data: Gestational trophoblastic disease.  Diagnosed 2012. Chemotherapy complete.  Cough.  CT CHEST WITH CONTRAST  Technique:  Multidetector CT imaging of the chest was performed following the standard protocol during bolus administration of intravenous contrast.  Contrast:  80 ml Omnipaque-300  Comparison: 07/22/2011  Findings: Lung windows demonstrate 3 mm right upper lobe lung nodule on image 17 which is unchanged. Lingular volume loss/scarring is unchanged. No new or enlarging nodules.  Soft tissue windows demonstrate mild cardiomegaly, without pericardial or pleural effusions. No central pulmonary embolism, on this non-dedicated study.  No mediastinal or hilar adenopathy. Minimal residual thymic tissue in the anterior mediastinum.  Limited abdominal imaging demonstrates splenule.  Possible mild hepatic steatosis No acute osseous abnormality.  IMPRESSION:  1.  Stable 3 mm right upper lobe lung nodule.  No new or enlarging pulmonary nodules identified. 2.  Otherwise, no evidence metastatic disease within the chest.  Original Report Authenticated By: Consuello Bossier, M.D.    Medications: I have reviewed the patient's current medications. Depo-Provera given today (one week early to avoid another visit to this office)  Assessment/Plan:  1. 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 [name] will have BHCG 1-2, 1-30, 2-27 +depo-Provera, 3-27, 4-24, 5-22 with depo-Provera/CBC and CMET/ visit Newel Oien, and 6-19.  This schedule will be communicated to gyn onc for appropriate follow up back to them also. Repeat CT chest as above; Dr.Soper's recommendations as above. 2. DVT related to PICC, resolved and off coumadin 3. Deconditioning: I have LM for our social worker ? If any [employer] programs available for [facility] patients now 4. Sleep problems: referral to Dr.Clance requested. 5.  No abnormal findings on right breast biopsy last month 6. Viral type URI: continue symptomatic treatment 7. Diabetes: blood sugars ~ 120 when [name] has checked at 0300 8. HTN followed by Dr.Lowne  I have signed short term disability papers again today, anticipating return to work with start of second semester [employer] in Jan 2013.        Hasana Alcorta P, MD   10/02/2011, 11:13 AM

## 2011-10-06 ENCOUNTER — Telehealth: Payer: Self-pay

## 2011-10-06 NOTE — Telephone Encounter (Signed)
[name] WANTED TO KNOW IF SHE COULD GIVE BLOOD THIS YEAR.  SHE USUALLY GIVES AT THIS TIME OF YEAR.  TLD HER THAT SHE HAS BEEN ANEMIC WITH HER TREATMENTS AND HGB. BETTER BUT SHE SHOULD FORGO DONATING THIS YEAR.  TOLD HER THAT WITH HAVING RECEIVED CHEMOTHERAPY SHE MAY NOT BE ABLE TO GIVE BLOOD FOR SOME PERIOD OF TIME OR NOT AT ALL. TOLD HER THAT I FAXED HER DISABILITY FORM TO MS. [employer] ON 10-01-11 AS REQUESTED.

## 2011-10-07 ENCOUNTER — Other Ambulatory Visit (HOSPITAL_COMMUNITY): Payer: Self-pay

## 2011-10-10 ENCOUNTER — Encounter: Payer: Self-pay | Admitting: Dietician

## 2011-10-10 ENCOUNTER — Encounter: Payer: BC Managed Care – PPO | Attending: Family Medicine | Admitting: Dietician

## 2011-10-10 ENCOUNTER — Telehealth: Payer: Self-pay | Admitting: Oncology

## 2011-10-10 DIAGNOSIS — E119 Type 2 diabetes mellitus without complications: Secondary | ICD-10-CM | POA: Insufficient documentation

## 2011-10-10 DIAGNOSIS — Z713 Dietary counseling and surveillance: Secondary | ICD-10-CM | POA: Insufficient documentation

## 2011-10-10 NOTE — Telephone Encounter (Signed)
Note from Gyn onc that pt is now scheduled to see Dr.Brewster Oct 30, 2011 at 1245

## 2011-10-10 NOTE — Progress Notes (Signed)
  Medical Nutrition Therapy:  Appt start time: 0800 end time:  0900.  Assessment:  Primary concerns today: Glucose levels and having lower levels that will help her "get off the diabetes medication.  Today at 295.1 lb, has lose 7.0lb since her last visit on 08/25/11.  Is eating more regular meals and snacks.  Is reading food labels, has purchased a book with meal suggestions for 400 calories along with recipes.  Using this and other resources to limit calorie and carbohydrate intake.   BLOOD GLUCOSE: Checking at various times during the day.  Using Glucose Buddy for recording. Using the graphing program, and this has helped with keeping her checking and on track. Fasting: 126, 109,103,125,111 PC Breakfast:118,116 AC Lunch:130 PC Dinner 121  MEDICATIONS: Remain the same.  Not taking the PRN's for pain and nausea.  Has added Iron to the list.  DIETARY INTAKE:  24-hr recall:  B (8-9:00 AM): Omelet with veggies, and canadian bacon, whole wheat bagel, OR 110 calorie multi-grain waffle with yogurt and berries, OR Low carb/calorie pancakes with sugar free syrup and canadian bacon.   12L (12:00 PM): Sandwich on white wheat or 40 calorie bread with [place], ham and maybe 2 cookies  D (5:00 PM): Baked chicken or a baked meat, a salad or green beans and maybe a starch such as mashed potatoes or corn.  Snk (9:00 PM): 4 ginger snap cookies and 4 oz of Almond Milk. Beverages: Almond milk, orange juice for pills, water  Recent physical activity: Has not started to exercise.  Plans to begin bike riding with her daughter on a regular basis.  Wants to work-up to a goal of 2-3 miles 3-4 times per week.  Estimated energy needs:  Ht 70 in; wt:295.1 lb; Adjusted; 196 lb or 89 kg;   1400-1500 calories 155-160 g carbohydrates 100-105 g protein 36-38 g fat  Progress Towards Goal(s):  In progress.   Nutritional Diagnosis:  [place]-2.1 Inpaired nutrition utilization As related to glucose.  As evidenced by elevated  fasting blood glucose levels and A1C of 6.5%.    Intervention:  Nutrition Working to decrease carbs and calories.  Using many web based sites for recipes, carb counting restaurant selection and menu items.  Handouts given during visit include:  Calculation of mean glucose levels and the A1C graph for correlation and approximation of A1C levels.  List of snack foods.  Will mail her a list of foods/vitamin C that facilitates the uptake of Iron.  Monitoring/Evaluation:  Dietary intake, exercise, blood glucose levels, and body weight in 3 months .

## 2011-10-10 NOTE — Patient Instructions (Signed)
   Continue your current routine of the three/four small meals per day.  Continue to read labels and monitor calories and carbohydrates.  Continue the glucose monitoring.  Occasionally, add them up and see if 50% fall in the target range and compare with the A1C graphic.  Come to the center and weight yourself at any time using our scales.  Try to get the bike riding started.  Start slow and build-up your tolerance.  Most insurance companies allow 1-2 hours per year for diabetes follow-up.  Consult BCBS and see what they allow and plan for a visit/visits with me next year so I can assist with helping you keep you glucose on track.  Keep up the good work.  I'm proud of your success.

## 2011-10-14 ENCOUNTER — Other Ambulatory Visit: Payer: Self-pay | Admitting: Family Medicine

## 2011-10-17 ENCOUNTER — Encounter: Payer: Self-pay | Admitting: Pulmonary Disease

## 2011-10-17 ENCOUNTER — Ambulatory Visit (INDEPENDENT_AMBULATORY_CARE_PROVIDER_SITE_OTHER): Payer: BC Managed Care – PPO | Admitting: Pulmonary Disease

## 2011-10-17 VITALS — BP 126/88 | HR 97 | Temp 97.9°F | Ht 70.0 in | Wt 298.4 lb

## 2011-10-17 DIAGNOSIS — F5104 Psychophysiologic insomnia: Secondary | ICD-10-CM | POA: Insufficient documentation

## 2011-10-17 DIAGNOSIS — G4700 Insomnia, unspecified: Secondary | ICD-10-CM

## 2011-10-17 NOTE — Progress Notes (Signed)
Subjective:    Patient ID: [name], female    DOB: 05/17/1971, 40 y.o.   MRN: [id number]  HPI The patient is a 40 year old female who I've been asked to see for sleep onset and maintenance issues.  She states for almost the last one year she has been waking up every night between 2 and 4 AM, and is unable to read turned to sleep before starting her day.  She relates the onset to her recent pregnancy and subsequent chemotherapy and hospitalizations.  The patient typically goes to bed between 10 and 12 AM, and admits that she may read or watch TV in bed.  It typically takes her 30-60 minutes to fall asleep, and then she will typically awaken between 2 and 4 AM.  She is unable to fall asleep after this, and will often stay in bed for long periods of time.  She may also read or watch TV in bed during this time.  Most recently, she has got not to eat, or to do household chores.  She will get up to start her day between 9 and 10 AM, and is exhausted by this time.  She has significant daytime sleepiness with any period of inactivity, and will often nap during the day.  She does not have any caffeine intake.  The patient does have snoring, but no one has mentioned an abnormal breathing pattern during sleep.  She does not think she takes during the night, and denies any restless leg symptoms.  She was tried on a sedative hypnotic which did help for about one month, then no further.  Sleep Questionnaire: What time do you typically go to bed?( Between what hours) 10pm-12am How long does it take you to fall asleep? 87minutes-1hr How many times during the night do you wake up? 1 What time do you get out of bed to start your day? 0900 Do you drive or operate heavy machinery in your occupation? No How much has your weight changed (up or down) over the past two years? (In pounds) 30 lb (13.608 kg) Have you ever had a sleep study before? No Do you currently use CPAP? No Do you wear oxygen at any time? No    Review of  Systems  Constitutional: Positive for unexpected weight change. Negative for fever.  HENT: Positive for congestion and dental problem. Negative for ear pain, nosebleeds, sore throat, rhinorrhea, sneezing, trouble swallowing, postnasal drip and sinus pressure.   Eyes: Negative for redness and itching.  Respiratory: Positive for shortness of breath. Negative for cough, chest tightness and wheezing.   Cardiovascular: Positive for leg swelling. Negative for palpitations.  Gastrointestinal: Negative for nausea and vomiting.  Genitourinary: Negative for dysuria.  Musculoskeletal: Negative for joint swelling.  Skin: Negative for rash.  Neurological: Negative for headaches.  Hematological: Does not bruise/bleed easily.  Psychiatric/Behavioral: Negative for dysphoric mood. The patient is not nervous/anxious.        Objective:   Physical Exam Constitutional:  Obese female, no acute distress  HENT:  Nares patent without discharge  Oropharynx without exudate, palate and uvula are elongated  Eyes:  Perrla, eomi, no scleral icterus  Neck:  No JVD, no TMG  Cardiovascular:  Normal rate, regular rhythm, no rubs or gallops.  No murmurs        Intact distal pulses  Pulmonary :  Normal breath sounds, no stridor or respiratory distress   No rales, rhonchi, or wheezing  Abdominal:  Soft, nondistended, bowel sounds present.  No tenderness  noted.   Musculoskeletal:  No lower extremity edema noted.  Lymph Nodes:  No cervical lymphadenopathy noted  Skin:  No cyanosis noted  Neurologic:  Appears sleepy, appropriate, moves all 4 extremities without obvious deficit.         Assessment & Plan:

## 2011-10-17 NOTE — Assessment & Plan Note (Signed)
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  I have reviewed good sleep hygiene with her in detail, and also the discussed sleep restriction therapy as well as stimulus control therapy.  I have also explained that medications rarely fixes this problem, and that behavioral therapy is usually best.  She may benefit from melatonin about 3 hours before bedtime.  I cannot totally exclude a nocturnal sleep issues such as obstructive sleep apnea or periodic limb movements, but need to get her sleeping through the night before we could ever consider evaluating for this.

## 2011-10-17 NOTE — Patient Instructions (Signed)
Try and establish your bedtime at 12mn, no sooner Do not read or watch tv in bed, and no computer time after 10pm If you cannot initiate sleep within , leave bedroom and go to family room to read or watch tv.  No eating/drinking, no activities, nothing to stimulate mind.  When sleepy, can go back to bed.  If you cannot go back to sleep within , start all over.  Do this as many times as it takes to fall asleep.  Awaken every day at same time No napping during day under any circumstances. Stay out of bedroom during day if possible. Can try melatonin 3mg  about 3 hrs before your bedtime. followup with me in 3 weeks.

## 2011-10-23 ENCOUNTER — Other Ambulatory Visit: Payer: Self-pay | Admitting: Family Medicine

## 2011-10-23 MED ORDER — GLUCOSE BLOOD VI STRP: ORAL_STRIP | Status: DC

## 2011-10-23 NOTE — Telephone Encounter (Signed)
Rx has not been faxed since 9/12. Will refax and if they will not pay, I can switch meters.  Patient made aware     KP

## 2011-10-29 ENCOUNTER — Encounter: Payer: Self-pay | Admitting: Gynecologic Oncology

## 2011-10-29 ENCOUNTER — Other Ambulatory Visit: Payer: BC Managed Care – PPO | Admitting: Lab

## 2011-10-29 DIAGNOSIS — O019 Hydatidiform mole, unspecified: Secondary | ICD-10-CM

## 2011-10-29 LAB — HCG, QUANTITATIVE, PREGNANCY: hCG, Beta Chain, Quant, S: <2 m[IU]/mL

## 2011-10-30 ENCOUNTER — Encounter: Payer: Self-pay | Admitting: Gynecologic Oncology

## 2011-10-30 ENCOUNTER — Ambulatory Visit: Payer: BC Managed Care – PPO | Attending: Gynecologic Oncology | Admitting: Gynecologic Oncology

## 2011-10-30 ENCOUNTER — Encounter: Payer: Self-pay | Admitting: Oncology

## 2011-10-30 VITALS — BP 110/70 | HR 84 | Temp 98.5°F | Resp 18 | Ht 70.0 in | Wt 297.8 lb

## 2011-10-30 DIAGNOSIS — G589 Mononeuropathy, unspecified: Secondary | ICD-10-CM | POA: Insufficient documentation

## 2011-10-30 DIAGNOSIS — Z79899 Other long term (current) drug therapy: Secondary | ICD-10-CM | POA: Insufficient documentation

## 2011-10-30 DIAGNOSIS — E119 Type 2 diabetes mellitus without complications: Secondary | ICD-10-CM | POA: Insufficient documentation

## 2011-10-30 DIAGNOSIS — D392 Neoplasm of uncertain behavior of placenta: Secondary | ICD-10-CM | POA: Insufficient documentation

## 2011-10-30 NOTE — Patient Instructions (Signed)
Follow up as scheduled.  

## 2011-10-30 NOTE — Progress Notes (Signed)
Put disability paper on nurse's desk. °

## 2011-10-30 NOTE — Progress Notes (Signed)
Follow-up Note: Gyn-Onc   [name] 41 y.o. female  CC:  Chief Complaint  Patient presents with  . GTD    Follow up    HPI: This is a 41 year old gravida 4, para 2  with 1 neonatal demise, last pregnancy 2-1/2 years ago, that resulted  in miscarriage. She presented with bleeding in April 2012. D and C was  performed at that time, returned with evidence of a complete molar  pregnancy. Beta HCG after the D and C was 158,467. The beta HCG's  declined and then rose to 257,443. Methotrexate was initiated; however,  plateaued at 175,721. She was then seen in evaluation on Mar 25, 2011,  and calculation of the [facility] score returned with a value of 8 which  required treatment with [facility]. She received 6 cycles of EMACO. Of  note, there was normalization of her beta HCG just prior to cycle 5.  The last HCG returned a value of less than 2. She has received Depo  Provera and her course was complicated by neuropathy, which prompted  elimination of the last dose of vincristine. The treatment course was  also complicated by a clot in the PICC line.   Interval History: Case discussed with Dr.John Kyla Balzarine specifically the residual nodule in the chest and negative HCG. He recommended continuing BHCG monthly x 18 months rather than usual 12 months.   Performance status: 1  Current Meds:  Outpatient Encounter Prescriptions as of 10/30/2011  Medication Sig Dispense Refill  . amLODipine (NORVASC) 10 MG tablet Take 1 tablet (10 mg total) by mouth daily.  90 tablet  3  . Ferrous Sulfate (IRON) 325 (65 FE) MG TABS Take 1 tablet by mouth 2 (two) times daily. Taking with meal or with orange juice.       Marland Kitchen GLUCOPHAGE XR 500 MG 24 hr tablet TAKE 1 TABLET EVERY DAY WITH BREAKFAST.  30 each  2  . glucose blood (ONE TOUCH ULTRA TEST) test strip Check blood sugar three times a day  100 each  5  . ibuprofen (ADVIL,MOTRIN) 600 MG tablet       . medroxyPROGESTERone (DEPO-PROVERA) 150 MG/ML injection Inject 150 mg  into the muscle every 3 (three) months. Taken as adjunct to cancer therapy for 1 year past completing chemo.       . ondansetron (ZOFRAN) 8 MG tablet Take 8 mg by mouth every 12 (twelve) hours as needed.       Letta Pate DELICA LANCETS MISC 1 application by Does not apply route 2 (two) times daily.  100 each  1  . Polyethylene Glycol 3350 (MIRALAX PO) Take 1-2 Packages by mouth 2 (two) times daily. Currently less constipated and taking every 2 days as needed.         Allergy:  Allergies  Allergen Reactions  . Sulfonamide Derivatives     REACTION: unspecified  . Sulfur Hives    Social Hx:   History   Social History  . Marital Status: Legally Separated    Spouse Name: N/A    Number of Children: 1  . Years of Education: N/A   Occupational History  . music [employer]   Social History Main Topics  . Smoking status: Never Smoker   . Smokeless tobacco: Not on file  . Alcohol Use: No  . Drug Use: No  . Sexually Active: Yes   Other Topics Concern  . Not on file   Social History Narrative  . No narrative on file  Past Surgical Hx:  Past Surgical History  Procedure Date  . Cesarean section     x2  . Right breast biopsy     x2  . Dilation and curettage of uterus     Past Medical Hx:  Past Medical History  Diagnosis Date  . Hypertension     during pregnancy  . Molar pregnancy   . Diabetes mellitus   . Gestational trophoblastic disease     Family Hx:  Family History  Problem Relation Age of Onset  . Hypertension Mother   . Diabetes Paternal Grandfather   . Stroke      aunts and uncles on mother side  . Obesity    . Hypertension Father   . Hypertension Paternal Grandfather   . Hypertension Paternal Grandmother     [name]:  Constitutional  Feels well reports residual neuropathy increased weight and residual fatigue Skin No rash, sores, jaundice, itching, dryness,  Cardiovascular  No chest pain, shortness of breath, or edema    Pulmonary  No cough or wheeze.  Gastro Intestinal  No nausea, vomitting, or diarrhoea. No bright red blood per rectum, no abdominal pain, change in bowel movement, or constipation.  Genito Urinary  No frequency, urgency, dysuria, no vaginal bleeding. Musculo Skeletal  No myalgia, arthralgia, joint swelling or pain  Neurologic  No weakness, numbness, change in gait,  Psychology  No depression, anxiety, insomnia.     Vitals:  Blood pressure 110/70, pulse 84, temperature 98.5 F (36.9 C), temperature source Oral, resp. rate 18, height 5\' 10"  (1.778 m), weight 297 lb 12.8 oz (135.081 kg).  Physical Exam: WD female in no acute this Neck  Supple without any enlargements.  Lymph node survey. No cervical supraclavicular cervical or inguinal adenopathy Cardiovascular  Pulse normal rate, regularity and rhythm. S1 and S2 normal. Lungs  Clear to auscultation bilateraly, without wheezes/crackles/rhonchi. Good air movement.  Skin  No rash/lesions/breakdown  Psychiatry  Alert and oriented to person, place, and time  Back No CVA tenderness Abdomen  Normoactive bowel sounds, abdomen soft, non-tender and obese.  Genito Urinary  Vulva/vagina: Normal external female genitalia.  No lesions.   Bladder/urethra:  No lesions or masses  Vagina: No palpable lesions. The cervix is small mobile no parametrial adenopathy Rectal  Good tone, no masses no cul de sac nodularity.  Extremities  No bilateral cyanosis, clubbing or edema.    Assessment/Plan:  This is a 41 y.o. year old with metastatic gestational trophoblastic neoplasia. She's completed" treatment with normalization of her beta hCG. Given the residual lung lesion the plan is for observation for 18 months instead of 12 months. The patient has been counseled regarding signs and symptoms of of recurrent disease. She's been advised regarding the importance of not becoming pregnant in the interim.  Follow up with Dr. Darrold Span in May 2013, Dr.  Normand Sloop in September 2013 GYN oncology in December 2013   Duey Liller, MD., PhD. 10/30/2011, 1:15 PM

## 2011-11-03 ENCOUNTER — Telehealth: Payer: Self-pay | Admitting: Family Medicine

## 2011-11-03 NOTE — Telephone Encounter (Signed)
Cici from [facility] called stating that through patients insurance the lancets and strips are less expensive through pharmacy. Cici states that she is placing refill on hold for right now.

## 2011-11-03 NOTE — Telephone Encounter (Signed)
Discussed with patient and she stated she is aware of the recommendations, but she went to the pharmacy and they will not let her fill the test strips until February per the insurance company. She stated she will only be testing her blood sugars once a day and wanted me to make Dr.Lowne aware.     KP

## 2011-11-04 ENCOUNTER — Telehealth: Payer: Self-pay

## 2011-11-04 ENCOUNTER — Encounter: Payer: Self-pay | Admitting: Oncology

## 2011-11-04 NOTE — Telephone Encounter (Signed)
SPOKE WITH MS. [name] AND TOLD HER THAT HER DISABILITY FORM SHE DROPPED OFF LAST WEEK WAS FAXED TODAY BY [employer] [facility] [employer].

## 2011-11-04 NOTE — Progress Notes (Signed)
Faxed disability paper to Abel Presto @ [employer] [phone].

## 2011-11-07 ENCOUNTER — Encounter: Payer: Self-pay | Admitting: Pulmonary Disease

## 2011-11-07 ENCOUNTER — Ambulatory Visit (INDEPENDENT_AMBULATORY_CARE_PROVIDER_SITE_OTHER): Payer: BC Managed Care – PPO | Admitting: Pulmonary Disease

## 2011-11-07 VITALS — BP 122/88 | HR 94 | Temp 98.8°F | Ht 70.0 in | Wt 295.8 lb

## 2011-11-07 DIAGNOSIS — G4700 Insomnia, unspecified: Secondary | ICD-10-CM

## 2011-11-07 DIAGNOSIS — F5104 Psychophysiologic insomnia: Secondary | ICD-10-CM

## 2011-11-07 NOTE — Patient Instructions (Signed)
Continue with behavioral therapies as you are doing. Try the melatonin 3mg  about 3 hrs before bedtime. Please call me in 2-3 weeks to give update on how things are going.

## 2011-11-07 NOTE — Progress Notes (Signed)
  Subjective:    Patient ID: [name], female    DOB: [birth year], 41 y.o.   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  She's been trying to stick with the behavioral therapies as outlined last visit, and has made some small improvement.  It was difficult to do the therapies through the holidays, but the last week and a half she has seen definite improvement since she has been doing it more consistently.  She never did try the melatonin, and I've encouraged her to do this.   [name]  Constitutional: Negative for fever and unexpected weight change.  HENT: Negative for ear pain, nosebleeds, congestion, sore throat, rhinorrhea, sneezing, trouble swallowing, dental problem, postnasal drip and sinus pressure.   Eyes: Negative for redness and itching.  Respiratory: Negative for cough, chest tightness, shortness of breath and wheezing.   Cardiovascular: Negative for palpitations and leg swelling.  Gastrointestinal: Negative for nausea and vomiting.  Genitourinary: Negative for dysuria.  Musculoskeletal: Negative for joint swelling.  Skin: Negative for rash.  Neurological: Negative for headaches.  Hematological: Bruises/bleeds easily.  Psychiatric/Behavioral: Negative for dysphoric mood. The patient is not nervous/anxious.        Objective:   Physical Exam Obese female in no acute distress Nose without purulence or discharge noted Lower extremities without significant edema, no cyanosis Alert, does not appear to be overly sleepy, moves all 4 extremities.       Assessment & Plan:

## 2011-11-07 NOTE — Assessment & Plan Note (Signed)
The pt is a little better, especially the past one week when she can work on the therapy consistently (after holidays).  I have reminded her this improves in "baby steps".  I have encouraged her to try the melatonin as recommended last visit, as this may help her consolidate her sleep.

## 2011-11-26 ENCOUNTER — Telehealth: Payer: Self-pay

## 2011-11-26 ENCOUNTER — Other Ambulatory Visit (HOSPITAL_BASED_OUTPATIENT_CLINIC_OR_DEPARTMENT_OTHER): Payer: BC Managed Care – PPO | Admitting: Lab

## 2011-11-26 DIAGNOSIS — Z452 Encounter for adjustment and management of vascular access device: Secondary | ICD-10-CM

## 2011-11-26 DIAGNOSIS — D392 Neoplasm of uncertain behavior of placenta: Secondary | ICD-10-CM

## 2011-11-26 DIAGNOSIS — Z5189 Encounter for other specified aftercare: Secondary | ICD-10-CM

## 2011-11-26 LAB — CBC WITH DIFFERENTIAL/PLATELET
BASO%: 0.5 % (ref 0.0–2.0)
Eosinophils Absolute: 0.1 10*3/uL (ref 0.0–0.5)
LYMPH%: 41.9 % (ref 14.0–49.7)
MCHC: 32.3 g/dL (ref 31.5–36.0)
MONO#: 0.4 10*3/uL (ref 0.1–0.9)
NEUT#: 2.8 10*3/uL (ref 1.5–6.5)
Platelets: 193 10*3/uL (ref 145–400)
RBC: 5.27 10*6/uL (ref 3.70–5.45)
RDW: 18.2 % — ABNORMAL HIGH (ref 11.2–14.5)
WBC: 5.7 10*3/uL (ref 3.9–10.3)
nRBC: 0 % (ref 0–0)

## 2011-11-26 LAB — HCG, QUANTITATIVE, PREGNANCY: hCG, Beta Chain, Quant, S: <2 m[IU]/mL

## 2011-11-26 NOTE — Telephone Encounter (Signed)
LEFT A MESSAGE ON MS. Priore'S VM  THAT HER HCG LEVEL WAS GOO AT < 2.0 TODAY.  WILL GIVE A COPY TO NANCY WILKERSON IN GYN ONC. PT. TO KEEP APPT. ON 12-24-11 AS SCHEDULED FOR HCG LEVEL AND DEPO-PROVERA INJECTION.

## 2011-11-28 ENCOUNTER — Encounter: Payer: Self-pay | Admitting: Oncology

## 2011-11-28 NOTE — Progress Notes (Signed)
Gyn onc plan is B HCG monthly x 18, LL May, [place]illard Sept, Gyn onc Dec.

## 2011-11-30 ENCOUNTER — Other Ambulatory Visit: Payer: Self-pay | Admitting: Oncology

## 2011-11-30 DIAGNOSIS — O019 Hydatidiform mole, unspecified: Secondary | ICD-10-CM

## 2011-12-16 ENCOUNTER — Encounter: Payer: Self-pay | Admitting: Family Medicine

## 2011-12-16 ENCOUNTER — Ambulatory Visit (INDEPENDENT_AMBULATORY_CARE_PROVIDER_SITE_OTHER): Payer: BC Managed Care – PPO | Admitting: Family Medicine

## 2011-12-16 DIAGNOSIS — R209 Unspecified disturbances of skin sensation: Secondary | ICD-10-CM

## 2011-12-16 DIAGNOSIS — E119 Type 2 diabetes mellitus without complications: Secondary | ICD-10-CM

## 2011-12-16 DIAGNOSIS — E1159 Type 2 diabetes mellitus with other circulatory complications: Secondary | ICD-10-CM | POA: Insufficient documentation

## 2011-12-16 DIAGNOSIS — I10 Essential (primary) hypertension: Secondary | ICD-10-CM

## 2011-12-16 DIAGNOSIS — R609 Edema, unspecified: Secondary | ICD-10-CM

## 2011-12-16 DIAGNOSIS — R202 Paresthesia of skin: Secondary | ICD-10-CM | POA: Insufficient documentation

## 2011-12-16 DIAGNOSIS — O019 Hydatidiform mole, unspecified: Secondary | ICD-10-CM

## 2011-12-16 MED ORDER — GLUCOSE BLOOD VI STRP: ORAL_STRIP | Status: DC

## 2011-12-16 MED ORDER — METFORMIN HCL ER 500 MG PO TB24: 500.0000 mg | ORAL_TABLET | Freq: Every day | ORAL | Status: DC

## 2011-12-16 MED ORDER — SPIRONOLACTONE 25 MG PO TABS: 25.0000 mg | ORAL_TABLET | Freq: Every day | ORAL | Status: DC

## 2011-12-16 NOTE — Assessment & Plan Note (Signed)
Secondary to chemo per onc--- should resolve within a year  They are following and treating

## 2011-12-16 NOTE — Assessment & Plan Note (Signed)
Stable con't meds Labs in June

## 2011-12-16 NOTE — Progress Notes (Signed)
  Subjective:    Patient ID: [name], female    DOB: 12/23/1970, 41 y.o.   MRN: [id number]  HPI Pt here for f/u.  She c/o numbness and tingling in hands and feet that started when her Chemo started.  She was told by heme/onc that it could take a year to resolve.   June 2013 will be a year.  Otherwise pt doing ok.  No other complaints. HYPERTENSION Disease Monitoring Blood pressure range--  Not checking Chest pain- no      Dyspnea- no Medications Compliance- good Lightheadedness- no   Edema- yes   DIABETES Disease Monitoring Blood Sugar ranges-95/130 Polyuria- no New Visual problems- no Medications Compliance- good Hypoglycemic symptoms- no  ROS See HPI above   PMH Smoking Status noted      [name] As above    Objective:   Physical Exam  Constitutional: She is oriented to person, place, and time. She appears well-developed and well-nourished.  Cardiovascular: Normal rate, regular rhythm and normal heart sounds.   No murmur heard. Pulmonary/Chest: Effort normal and breath sounds normal. No respiratory distress. She has no wheezes. She has no rales. She exhibits no tenderness.  Neurological: She is alert and oriented to person, place, and time.  Psychiatric: She has a normal mood and affect. Her behavior is normal. Judgment and thought content normal.          Assessment & Plan:

## 2011-12-16 NOTE — Assessment & Plan Note (Signed)
Controlled con't diet and exercise Labs in june

## 2011-12-16 NOTE — Assessment & Plan Note (Signed)
Per onc  

## 2011-12-16 NOTE — Patient Instructions (Signed)

## 2011-12-24 ENCOUNTER — Other Ambulatory Visit: Payer: Self-pay | Admitting: Oncology

## 2011-12-24 ENCOUNTER — Ambulatory Visit (HOSPITAL_BASED_OUTPATIENT_CLINIC_OR_DEPARTMENT_OTHER): Payer: BC Managed Care – PPO

## 2011-12-24 ENCOUNTER — Other Ambulatory Visit (HOSPITAL_BASED_OUTPATIENT_CLINIC_OR_DEPARTMENT_OTHER): Payer: BC Managed Care – PPO | Admitting: Lab

## 2011-12-24 VITALS — BP 124/83 | HR 71 | Temp 97.2°F

## 2011-12-24 DIAGNOSIS — O019 Hydatidiform mole, unspecified: Secondary | ICD-10-CM

## 2011-12-24 LAB — HCG, QUANTITATIVE, PREGNANCY: hCG, Beta Chain, Quant, S: <2 m[IU]/mL

## 2011-12-24 MED ORDER — MEDROXYPROGESTERONE ACETATE 150 MG/ML IM SUSP
150.0000 mg | Freq: Once | INTRAMUSCULAR | Status: AC
Start: 2011-12-24 — End: 2011-12-24
  Administered 2011-12-24: 150 mg via INTRAMUSCULAR
  Filled 2011-12-24: qty 1

## 2011-12-30 ENCOUNTER — Telehealth: Payer: Self-pay

## 2011-12-30 DIAGNOSIS — I10 Essential (primary) hypertension: Secondary | ICD-10-CM

## 2011-12-30 MED ORDER — AMLODIPINE BESYLATE 10 MG PO TABS: 10.0000 mg | ORAL_TABLET | Freq: Every day | ORAL | Status: DC

## 2011-12-30 MED ORDER — METFORMIN HCL ER 500 MG PO TB24: 500.0000 mg | ORAL_TABLET | Freq: Every day | ORAL | Status: DC

## 2011-12-30 NOTE — Telephone Encounter (Signed)
Patient called requesting her meds be sent to the pharmacy.  She went out of town and unfortunately left her prescriptions at the hotel where she was at.  The hotel doesn't have them anymore.  Okay to resend?

## 2011-12-30 NOTE — Telephone Encounter (Signed)
Yes please resend

## 2011-12-30 NOTE — Telephone Encounter (Signed)
SPOKE WITH MS. Wardle AND TOLD HER THAT HER HCG LEVEL WAS LESS THAN 2 ON 12-24-11 PER DR. Darrold Span. PT. PLEASED.

## 2011-12-30 NOTE — Telephone Encounter (Signed)
Left Pt detail message Rx sent to pharmacy.  

## 2012-01-21 ENCOUNTER — Other Ambulatory Visit: Payer: BC Managed Care – PPO | Admitting: Lab

## 2012-01-21 DIAGNOSIS — O019 Hydatidiform mole, unspecified: Secondary | ICD-10-CM

## 2012-01-23 ENCOUNTER — Telehealth: Payer: Self-pay

## 2012-01-23 NOTE — Telephone Encounter (Signed)
SPOKE WITH [name] AND TOLD HER THAT HER HCG LEVEL WAS GOOD AT <2.0 ON 01-21-12 PER DR. Darrold Span.  KEEP AAPPT. ON 02-18-12 FOR LAB AND 03-17-12 FOR LAB AND VISIT AS SCHEDULED PER DR. Darrold Span.  PT. VERBALIZED UNDERSTANDING.

## 2012-01-29 ENCOUNTER — Other Ambulatory Visit: Payer: Self-pay | Admitting: Family Medicine

## 2012-02-18 ENCOUNTER — Other Ambulatory Visit: Payer: BC Managed Care – PPO | Admitting: Lab

## 2012-02-18 ENCOUNTER — Telehealth: Payer: Self-pay | Admitting: *Deleted

## 2012-02-18 DIAGNOSIS — O019 Hydatidiform mole, unspecified: Secondary | ICD-10-CM

## 2012-02-18 LAB — HCG, QUANTITATIVE, PREGNANCY: hCG, Beta Chain, Quant, S: <2 m[IU]/mL

## 2012-02-18 NOTE — Telephone Encounter (Signed)
Pt notified that BHCG still great, <2 & to keep scheduled appt 03/17/12.  She expressed understanding & appreciation for call.

## 2012-03-17 ENCOUNTER — Other Ambulatory Visit (HOSPITAL_BASED_OUTPATIENT_CLINIC_OR_DEPARTMENT_OTHER): Payer: BC Managed Care – PPO | Admitting: Lab

## 2012-03-17 ENCOUNTER — Telehealth: Payer: Self-pay | Admitting: Oncology

## 2012-03-17 ENCOUNTER — Ambulatory Visit (HOSPITAL_BASED_OUTPATIENT_CLINIC_OR_DEPARTMENT_OTHER): Payer: BC Managed Care – PPO | Admitting: Oncology

## 2012-03-17 VITALS — BP 124/87 | HR 79 | Temp 98.8°F | Ht 70.0 in | Wt 293.6 lb

## 2012-03-17 DIAGNOSIS — Z86718 Personal history of other venous thrombosis and embolism: Secondary | ICD-10-CM

## 2012-03-17 DIAGNOSIS — G4700 Insomnia, unspecified: Secondary | ICD-10-CM

## 2012-03-17 DIAGNOSIS — O019 Hydatidiform mole, unspecified: Secondary | ICD-10-CM

## 2012-03-17 DIAGNOSIS — G620 Drug-induced polyneuropathy: Secondary | ICD-10-CM

## 2012-03-17 LAB — CBC WITH DIFFERENTIAL/PLATELET
BASO%: 0.6 % (ref 0.0–2.0)
LYMPH%: 39.7 % (ref 14.0–49.7)
MCHC: 31.8 g/dL (ref 31.5–36.0)
MCV: 69.1 fL — ABNORMAL LOW (ref 79.5–101.0)
MONO#: 0.4 10*3/uL (ref 0.1–0.9)
MONO%: 6.5 % (ref 0.0–14.0)
Platelets: 199 10*3/uL (ref 145–400)
RBC: 5.3 10*6/uL (ref 3.70–5.45)
RDW: 17.2 % — ABNORMAL HIGH (ref 11.2–14.5)
WBC: 5.7 10*3/uL (ref 3.9–10.3)
nRBC: 0 % (ref 0–0)

## 2012-03-17 LAB — COMPREHENSIVE METABOLIC PANEL
ALT: 13 U/L (ref 0–35)
AST: 13 U/L (ref 0–37)
Creatinine, Ser: 0.97 mg/dL (ref 0.50–1.10)
Sodium: 139 mEq/L (ref 135–145)
Total Bilirubin: 0.4 mg/dL (ref 0.3–1.2)

## 2012-03-17 MED ORDER — MEDROXYPROGESTERONE ACETATE 150 MG/ML IM SUSP
150.0000 mg | Freq: Once | INTRAMUSCULAR | Status: AC
  Administered 2012-03-17: 150 mg via INTRAMUSCULAR
  Filled 2012-03-17: qty 1

## 2012-03-17 NOTE — Telephone Encounter (Signed)
appts made and printed for pt aom °

## 2012-03-17 NOTE — Patient Instructions (Signed)
Increase daily walking for exercise, 4000 extra steps daily.

## 2012-03-18 ENCOUNTER — Encounter: Payer: Self-pay | Admitting: Oncology

## 2012-03-18 NOTE — Progress Notes (Signed)
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 This surveillance is planned x 18 months, thru Feb 2014.  History is of presumed pregnancy in Jan. 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 She had rising BHCG after the D&C which did not improve significantly with initial methotrexate. CT chest/abd/pelvis 03-20-11 had small pulmonary nodules (at least 4 in left and at least 3 on right) as well as large intrauterine mass. She was seen by Dr.Brewster and begun on EMA-CO as above. 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 Last BHCG done 09-08-11 was still <2.0; her last depo-Provera was given 07-16-11. Course was also complicated by DVT at site of initial RUE PICC, 3 months of coumadin completed in midOct 2012. Repeat CT chest 09-30-2011 showed only stable 3 mm RUL nodule. Recommendation for 18 month surveillance was based on review by Dr Ronita Hipps.  Patient continues to gradually improve since completing chemotherapy. She is back at work and is trying to lose weight in a program at [facility]; we have discussed study (for which she is not eligible) which has goal of extra 4000 steps daily over baseline. She is not getting regular exercise yet. She has tried interventions for insomnia per Dr Shelle Iron, which have not been very helpful, tho she does seem to be sleeping a little more than previously, and I have encouraged her to increase physical exercise for this reason also. She saw Dr Nelly Rout in Jan.  On [name]: no recent  fever or symptoms of infection. Some cough and congestion felt related to mold in school. No HA or other neurologic symptoms. No chest pain. No other cardiac symptoms. No bleeding. No abdominal or pelvic pain. Peripheral neuropathy symptoms in fingers and feet unchanged. No swelling LE. No nausea. Remainder of 10 point ROS negative.   Objective:  Vital signs in last 24 hours:  BP 124/87  Pulse 79  Temp(Src) 98.8 F (37.1 C) (Oral)  Ht 5\' 10"  (1.778 m)  Wt 293 lb 9.6 oz (133.176 kg)  BMI 42.13 kg/m2 Easily mobile, looks comfortable, respirations not labored RA. Weight is down 5 lbs.   HEENT:mucous membranes moist, pharynx normal without lesions. PERRl. Neck supple LymphaticsCervical, supraclavicular, and axillary nodes normal.No inguinal adenopathy Resp: clear to auscultation bilaterally and normal percussion bilaterally Cardio: RRR, no murmur or gallop GI: obese, soft, normal bowel sounds, no appreciable HSM or mass Extremities: extremities normal, atraumatic, no cyanosis or edema Neuro:reduced light touch sensation, location:tips of fingers. Skin without rash or petechiae  Lab Results:   [facility] 03/17/12 0915  WBC 5.7  HGB 11.6  HCT 36.6  PLT 199  ANC 3.0  BMET  Basename 03/17/12 0915  NA 139  K 4.2  CL 106  CO2 25  GLUCOSE 82  BUN 11  CREATININE 0.97  CALCIUM 9.3  Remainder of full CMET WNL except AP slightly low at 34 B HCG not resulted today, last <2.0 in April  Studies/Results:  No results found. Last mammograms [facility] 08-12-11 Medications: I have reviewed the patient's current medications.She was given  depoProvera today  Assessment/Plan: 1. High risk metastatic GTD s/p 6 cycles of EMA-CO thru Aug 2012, now on surveillance as above. She will be seen with lab and depoProvera in Aug, then by Dr Nelly Rout ~ Nov or early Dec. We will recheck CBC in Auguse. 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

## 2012-04-01 ENCOUNTER — Other Ambulatory Visit: Payer: Self-pay | Admitting: Family Medicine

## 2012-04-01 MED ORDER — METFORMIN HCL ER 500 MG PO TB24: 500.0000 mg | ORAL_TABLET | Freq: Every day | ORAL | Status: DC

## 2012-04-01 NOTE — Telephone Encounter (Signed)
refill metformin hcl er 500mg   Qty 30 Take one tablet every day with breakfast Last fill 05.04.13 Last ov 2.19.13

## 2012-04-14 ENCOUNTER — Other Ambulatory Visit: Payer: BC Managed Care – PPO | Admitting: Lab

## 2012-04-14 DIAGNOSIS — O019 Hydatidiform mole, unspecified: Secondary | ICD-10-CM

## 2012-04-16 ENCOUNTER — Telehealth: Payer: Self-pay | Admitting: *Deleted

## 2012-04-16 NOTE — Telephone Encounter (Signed)
Pt notified that marker is fine per Dr Darrold Span.

## 2012-05-12 ENCOUNTER — Telehealth: Payer: Self-pay | Admitting: *Deleted

## 2012-05-12 ENCOUNTER — Other Ambulatory Visit (HOSPITAL_BASED_OUTPATIENT_CLINIC_OR_DEPARTMENT_OTHER): Payer: BC Managed Care – PPO | Admitting: Lab

## 2012-05-12 DIAGNOSIS — O019 Hydatidiform mole, unspecified: Secondary | ICD-10-CM

## 2012-05-12 LAB — HCG, QUANTITATIVE, PREGNANCY: hCG, Beta Chain, Quant, S: <2 m[IU]/mL

## 2012-05-12 NOTE — Telephone Encounter (Signed)
Spoke with patient and let her know that hCG was good  <2.  Patient appreciated the call.

## 2012-05-28 ENCOUNTER — Telehealth: Payer: Self-pay | Admitting: Oncology

## 2012-05-28 NOTE — Telephone Encounter (Signed)
Pt came by and changed appt from 06/09/12 to 06/15/12, ok per Lissa Hoard, patient aware of appt

## 2012-06-09 ENCOUNTER — Other Ambulatory Visit: Payer: Self-pay | Admitting: Lab

## 2012-06-09 ENCOUNTER — Ambulatory Visit: Payer: Self-pay

## 2012-06-09 ENCOUNTER — Encounter: Payer: Self-pay | Admitting: Oncology

## 2012-06-15 ENCOUNTER — Ambulatory Visit (HOSPITAL_BASED_OUTPATIENT_CLINIC_OR_DEPARTMENT_OTHER): Payer: BC Managed Care – PPO

## 2012-06-15 ENCOUNTER — Ambulatory Visit (HOSPITAL_BASED_OUTPATIENT_CLINIC_OR_DEPARTMENT_OTHER): Payer: BC Managed Care – PPO | Admitting: Oncology

## 2012-06-15 ENCOUNTER — Encounter: Payer: Self-pay | Admitting: Oncology

## 2012-06-15 ENCOUNTER — Other Ambulatory Visit (HOSPITAL_BASED_OUTPATIENT_CLINIC_OR_DEPARTMENT_OTHER): Payer: BC Managed Care – PPO | Admitting: Lab

## 2012-06-15 ENCOUNTER — Ambulatory Visit: Payer: Self-pay | Admitting: Oncology

## 2012-06-15 ENCOUNTER — Telehealth: Payer: Self-pay | Admitting: Oncology

## 2012-06-15 VITALS — BP 122/85 | HR 71 | Temp 98.6°F | Resp 18 | Ht 70.0 in | Wt 302.1 lb

## 2012-06-15 DIAGNOSIS — G609 Hereditary and idiopathic neuropathy, unspecified: Secondary | ICD-10-CM

## 2012-06-15 DIAGNOSIS — O019 Hydatidiform mole, unspecified: Secondary | ICD-10-CM

## 2012-06-15 DIAGNOSIS — I428 Other cardiomyopathies: Secondary | ICD-10-CM

## 2012-06-15 DIAGNOSIS — D392 Neoplasm of uncertain behavior of placenta: Secondary | ICD-10-CM

## 2012-06-15 DIAGNOSIS — E119 Type 2 diabetes mellitus without complications: Secondary | ICD-10-CM

## 2012-06-15 LAB — CBC WITH DIFFERENTIAL/PLATELET
Eosinophils Absolute: 0.1 10*3/uL (ref 0.0–0.5)
HCT: 35.7 % (ref 34.8–46.6)
LYMPH%: 39.9 % (ref 14.0–49.7)
MONO#: 0.5 10*3/uL (ref 0.1–0.9)
NEUT#: 2.7 10*3/uL (ref 1.5–6.5)
NEUT%: 49.8 % (ref 38.4–76.8)
Platelets: 169 10*3/uL (ref 145–400)
RBC: 5.06 10*6/uL (ref 3.70–5.45)
WBC: 5.4 10*3/uL (ref 3.9–10.3)
lymph#: 2.2 10*3/uL (ref 0.9–3.3)

## 2012-06-15 LAB — HCG, QUANTITATIVE, PREGNANCY: hCG, Beta Chain, Quant, S: <2 m[IU]/mL

## 2012-06-15 MED ORDER — MEDROXYPROGESTERONE ACETATE 150 MG/ML IM SUSP
150.0000 mg | Freq: Once | INTRAMUSCULAR | Status: AC
Start: 2012-06-15 — End: 2012-06-15
  Administered 2012-06-15: 150 mg via INTRAMUSCULAR

## 2012-06-15 NOTE — Progress Notes (Signed)
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  History is of presumed pregnancy in Jan. 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 She had rising BHCG after the D&C which did not improve significantly with initial methotrexate. CT chest/abd/pelvis 03-20-11 had small pulmonary nodules (at least 4 in left and at least 3 on right) as well as large intrauterine mass. She was seen by Dr.Brewster and received  EMA-CO x 6 cycles thru August 2012. 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 B HCGs have remained <2.0. Course was complicated by DVT at site of initial RUE PICC, 3 months of coumadin completed in midOct 2012. Repeat CT chest 09-30-2011 showed only stable 3 mm RUL nodule. Recommendation for 18 month surveillance was based on review by Dr Ronita Hipps.   Patient has had difficulty with insomnia since at least early 2012. She was confirmed to have diabetes shortly after chemotherapy completed. She has not been able to lose weight adequately and has had poor exercise tolerance. She has recently been evaluated at [facility] for their weight loss program, but had abnormal cardiac evaluation, which she understands is cardiomyopathy. She has follow up planned with cardiologist there, as well as home sleep study upcoming (insurance would not cover sleep study otherwise). She reports blood sugars generally < 120. 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   Otherwise, she has had no recent infectious illness. She denies cough, chest pain, bleeding, abdominal or pelvic pain, symptoms of blood clots, changes in bowel or bladder, neurologic symptoms. Peripheral neuropathy symptoms seem gradually lessening. She is short of breath when she walks briskly. Remainder of 10 point [name] negative.  Her daughter will be at [employer] this year. Objective:  Vital signs in last 24 hours:  BP 122/85  Pulse 71  Temp 98.6 F (37 C) (Oral)  Resp 18  Ht 5\' 10"  (1.778 m)  Wt 302 lb 1.6 oz (137.032 kg)  BMI 43.35 kg/m2 Weight is up 8.5 lbs from last visit here. Ambulatory without difficulty in office. Respirations not labored RA with this activity. Alert, good historian.    HEENT:PERRLA, extra ocular movement intact, sclera clear, anicteric, oropharynx clear, no lesions and neck supple with midline trachea. No JVD upright. LymphaticsCervical, supraclavicular, and axillary nodes normal.No inguinal adenopathy Resp: clear to auscultation bilaterally and normal percussion bilaterally Cardio: regular rate and rhythm, clear heart sounds, no gallop GI: obese, soft, nontender, no appreciable mass or organomegaly Extremities: extremities normal, atraumatic, no cyanosis or edema Breasts:normal without suspicious masses, skin or nipple changes or axillary nodes Skin without rash or ecchymoses  Lab Results:  Results for orders placed in visit on 06/15/12  CBC WITH DIFFERENTIAL      Component Value Range   WBC 5.4  3.9 - 10.3 10e3/uL   NEUT# 2.7  1.5 - 6.5 10e3/uL   HGB 11.3 (*) 11.6 - 15.9 g/dL   HCT 16.1  09.6 - 04.5 %  Platelets 169  145 - 400 10e3/uL   MCV 70.6 (*) 79.5 - 101.0 fL   MCH 22.2 (*) 25.1 - 34.0 pg   MCHC 31.5  31.5 - 36.0 g/dL   RBC 1.61  0.96 - 0.45 10e6/uL   RDW 17.2 (*) 11.2 - 14.5 %   lymph# 2.2  0.9 - 3.3 10e3/uL   MONO# 0.5  0.1 - 0.9 10e3/uL   Eosinophils Absolute 0.1  0.0 - 0.5 10e3/uL   Basophils  Absolute 0.0  0.0 - 0.1 10e3/uL   NEUT% 49.8  38.4 - 76.8 %   LYMPH% 39.9  14.0 - 49.7 %   MONO% 8.6  0.0 - 14.0 %   EOS% 1.2  0.0 - 7.0 %   BASO% 0.5  0.0 - 2.0 %   hemoglobin was 11.6 in March  B HCG available after visit <2.0  Studies/Results:  No results found.  Medications: I have reviewed the patient's current medications.She has been on the Coreg for less than a month. 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 Continue monthly B HCG evaluations until Feb 2014; continue q 3 month depo Provera thru this. She will see Dr Nelly Rout on 10-12-12; I will see her in 6 months or sooner if needed. She is not presently scheduled for additional scans. 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

## 2012-06-15 NOTE — Patient Instructions (Signed)
Appointments as scheduled. 

## 2012-06-15 NOTE — Telephone Encounter (Signed)
Gave pt appt calendar  for September 2013 all the way to January 2014 lab and Md

## 2012-06-16 ENCOUNTER — Telehealth: Payer: Self-pay | Admitting: *Deleted

## 2012-06-16 NOTE — Telephone Encounter (Signed)
Left message for patient that hCG marker <2.0 is good.

## 2012-06-24 ENCOUNTER — Telehealth: Payer: Self-pay | Admitting: Family Medicine

## 2012-06-24 MED ORDER — AMLODIPINE BESYLATE 10 MG PO TABS: ORAL_TABLET | ORAL | Status: DC

## 2012-06-24 MED ORDER — METFORMIN HCL ER 500 MG PO TB24: ORAL_TABLET | ORAL | Status: DC

## 2012-06-24 NOTE — Telephone Encounter (Signed)
Refill Norvasc 10 mg

## 2012-06-24 NOTE — Telephone Encounter (Signed)
Refill: Metformin ER 500.  Send to [id number] [address] fx # [phone]

## 2012-07-05 ENCOUNTER — Other Ambulatory Visit: Payer: Self-pay | Admitting: Family Medicine

## 2012-07-05 NOTE — Telephone Encounter (Signed)
Refill glucose blood (ONE TOUCH ULTRA TEST) test strip, #180 wt/3-refills, test blood sugar two times daily -- last wrt 2.19.13   Last ov 2.19.13 f/u 401.9

## 2012-07-06 MED ORDER — GLUCOSE BLOOD VI STRP: ORAL_STRIP | Status: DC

## 2012-07-07 ENCOUNTER — Telehealth: Payer: Self-pay | Admitting: *Deleted

## 2012-07-07 ENCOUNTER — Other Ambulatory Visit (HOSPITAL_BASED_OUTPATIENT_CLINIC_OR_DEPARTMENT_OTHER): Payer: BC Managed Care – PPO

## 2012-07-07 DIAGNOSIS — D392 Neoplasm of uncertain behavior of placenta: Secondary | ICD-10-CM

## 2012-07-07 DIAGNOSIS — O019 Hydatidiform mole, unspecified: Secondary | ICD-10-CM

## 2012-07-07 LAB — HCG, QUANTITATIVE, PREGNANCY: hCG, Beta Chain, Quant, S: <2 m[IU]/mL

## 2012-07-07 NOTE — Telephone Encounter (Signed)
Per Dr Darrold Span, called patient to inform her that hCG level is good, <2.0.

## 2012-07-22 ENCOUNTER — Other Ambulatory Visit: Payer: Self-pay | Admitting: Family Medicine

## 2012-07-22 DIAGNOSIS — R921 Mammographic calcification found on diagnostic imaging of breast: Secondary | ICD-10-CM

## 2012-07-22 DIAGNOSIS — N63 Unspecified lump in unspecified breast: Secondary | ICD-10-CM

## 2012-08-04 ENCOUNTER — Other Ambulatory Visit: Payer: Self-pay | Admitting: Lab

## 2012-08-04 ENCOUNTER — Telehealth: Payer: Self-pay

## 2012-08-04 DIAGNOSIS — O019 Hydatidiform mole, unspecified: Secondary | ICD-10-CM

## 2012-08-04 NOTE — Telephone Encounter (Signed)
Left message that her Hcg level was still good at < 2.0 per Dr. Darrold Span.

## 2012-08-09 NOTE — Progress Notes (Signed)
This encounter was created in error - please disregard.

## 2012-08-12 ENCOUNTER — Ambulatory Visit
Admission: RE | Admit: 2012-08-12 | Discharge: 2012-08-12 | Disposition: A | Discharge status: Home / Self Care | Payer: BC Managed Care – PPO | Source: Ambulatory Visit | Attending: Family Medicine | Admitting: Family Medicine

## 2012-08-12 DIAGNOSIS — R921 Mammographic calcification found on diagnostic imaging of breast: Secondary | ICD-10-CM

## 2012-08-12 DIAGNOSIS — N63 Unspecified lump in unspecified breast: Secondary | ICD-10-CM

## 2012-09-01 ENCOUNTER — Ambulatory Visit (HOSPITAL_BASED_OUTPATIENT_CLINIC_OR_DEPARTMENT_OTHER): Payer: BC Managed Care – PPO

## 2012-09-01 ENCOUNTER — Telehealth: Payer: Self-pay

## 2012-09-01 ENCOUNTER — Other Ambulatory Visit (HOSPITAL_BASED_OUTPATIENT_CLINIC_OR_DEPARTMENT_OTHER): Payer: BC Managed Care – PPO | Admitting: Lab

## 2012-09-01 VITALS — BP 128/81 | HR 76 | Temp 98.1°F

## 2012-09-01 DIAGNOSIS — O019 Hydatidiform mole, unspecified: Secondary | ICD-10-CM

## 2012-09-01 MED ORDER — MEDROXYPROGESTERONE ACETATE 150 MG/ML IM SUSP
150.0000 mg | Freq: Once | INTRAMUSCULAR | Status: AC
  Administered 2012-09-01: 150 mg via INTRAMUSCULAR
  Filled 2012-09-01: qty 1

## 2012-09-01 NOTE — Telephone Encounter (Signed)
Spoke with [name] and told her that her  hcg level was good <2.0 per Dr. Darrold Span.

## 2012-09-01 NOTE — Telephone Encounter (Signed)
Message copied by Lorine Bears on Wed Sep 01, 2012  4:10 PM ------      Message from: Jama Flavors P      Created: Wed Sep 01, 2012  3:02 PM       Labs seen and need follow up: please let her know this was good

## 2012-09-07 ENCOUNTER — Other Ambulatory Visit: Payer: Self-pay

## 2012-09-07 MED ORDER — AMLODIPINE BESYLATE 10 MG PO TABS: ORAL_TABLET | ORAL | Status: DC

## 2012-09-07 NOTE — Telephone Encounter (Signed)
Request for Amlodipine--- Rx faxed     KP

## 2012-09-29 ENCOUNTER — Other Ambulatory Visit (HOSPITAL_BASED_OUTPATIENT_CLINIC_OR_DEPARTMENT_OTHER): Payer: BC Managed Care – PPO | Admitting: Lab

## 2012-09-29 DIAGNOSIS — O019 Hydatidiform mole, unspecified: Secondary | ICD-10-CM

## 2012-10-04 ENCOUNTER — Other Ambulatory Visit: Payer: Self-pay | Admitting: Family Medicine

## 2012-10-06 ENCOUNTER — Telehealth: Payer: Self-pay

## 2012-10-06 NOTE — Telephone Encounter (Signed)
Left message in voice mail regarding the HCG level from 09-30-11 as noted below by Dr. Darrold Span.

## 2012-10-06 NOTE — Telephone Encounter (Signed)
Message copied by Lorine Bears on Wed Oct 06, 2012 10:31 AM ------      Message from: Jama Flavors P      Created: Wed Sep 29, 2012  3:04 PM       Labs seen and need follow up: please let her know HCG was good again!

## 2012-10-12 ENCOUNTER — Encounter: Payer: Self-pay | Admitting: Gynecologic Oncology

## 2012-10-12 ENCOUNTER — Ambulatory Visit: Payer: BC Managed Care – PPO | Attending: Gynecologic Oncology | Admitting: Gynecologic Oncology

## 2012-10-12 VITALS — BP 128/74 | HR 72 | Temp 98.6°F | Resp 16 | Ht 70.0 in | Wt 312.8 lb

## 2012-10-12 DIAGNOSIS — N939 Abnormal uterine and vaginal bleeding, unspecified: Secondary | ICD-10-CM | POA: Insufficient documentation

## 2012-10-12 DIAGNOSIS — J984 Other disorders of lung: Secondary | ICD-10-CM | POA: Insufficient documentation

## 2012-10-12 DIAGNOSIS — N926 Irregular menstruation, unspecified: Secondary | ICD-10-CM | POA: Insufficient documentation

## 2012-10-12 DIAGNOSIS — O019 Hydatidiform mole, unspecified: Secondary | ICD-10-CM | POA: Insufficient documentation

## 2012-10-12 NOTE — Patient Instructions (Signed)
Given the residual lung lesion the plan is for observation for 18 months until 11/2012  Abnormal uterine bleeding: An endometrial biopsy was collected today we will  followup with you with the results.  Follow up with Dr. Darrold Span in February 2014 , Dr. Normand Sloop in 6 months  GYN oncology in December 2014   Thank you very much [name] for allowing me to provide care for you today.  I appreciate your confidence in choosing our Gynecologic Oncology team.  If you have any questions about your visit today please call our office and we will get back to you as soon as possible.  Maryclare Labrador. Bettina Warn MD., PhD Gynecologic Oncology

## 2012-10-12 NOTE — Progress Notes (Signed)
Progress Notes     Follow-up Note: Gyn-Onc   [name] 41 y.o. female   CC:  Chief Complaint  Patient presents with   .  GTD       Follow up     HPI: This is a 41 year old gravida 4, para 2  with 1 neonatal demise, last pregnancy 2-1/2 years ago, that resulted  in miscarriage. She presented with bleeding in April 2012. D and C was  performed at that time, returned with evidence of a complete molar  pregnancy. Beta HCG after the D and C was 158,467. The beta HCG's  declined and then rose to 257,443. Methotrexate was initiated; however,  plateaued at 175,721. She was then seen in evaluation on Mar 25, 2011,  and calculation of the [facility] score returned with a value of 8 which  required treatment with [facility]. She received 6 cycles of EMACO. Of  note, there was normalization of her beta HCG just prior to cycle 5.  The last HCG returned a value of less than 2. 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 Last cycle 06/2011 The treatment course was  also complicated by a clot in the PICC line.  Given the residual pulmonary nodule the residual nodule in the chest and negative HCG the plan is to continue  BHCG monthly x 18 months ( until Feb 2014) rather than usual 12 months.    Interval History: Recently the patient noted significant shortness of breath with ambulation.  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 Of note she has also had vaginal spotting for the last 2 weeks. She states that her last normal menstrual period was in 2011. Beta hCG on 09/29/2012 returned to value of 4. The patient also reports persistent neuropathy in her feet which has caused occasional tumbles.  Past Surgical History   Procedure  Date   .  Cesarean section        x2   .  Right breast biopsy     x2   .  Dilation and curettage of uterus       Past Medical History    Diagnosis  Date   .  Hypertension         during pregnancy   .  Molar pregnancy     .  Diabetes mellitus     .  Metastatic gestational trophoblastic disease       Family History   Problem  Relation  Age of Onset   .  Hypertension  Mother     .  Diabetes  Paternal Grandfather     .  Stroke           aunts and uncles on mother side   .  Obesity       .  Hypertension  Father     .  Hypertension  Paternal Grandfather     .  Hypertension  Paternal Grandmother      [name]: Constitutional   Feels well reports residual neuropathy in the feet  Skin No rash, sores, jaundice, itching, dryness,  Cardiovascular   No chest pain, shortness of breath, or edema   Pulmonary   No cough or wheeze.   Gastro Intestinal   No nausea, vomitting, or diarrhoea. No bright red blood per rectum, no abdominal pain, change in bowel movement, or constipation.   Genito Urinary   No  frequency, urgency, dysuria,  vaginal bleeding for 2 weeks  Musculo Skeletal   No myalgia, arthralgia, joint swelling or pain   Neurologic   No weakness, numbness, change in gait,   Psychology   No depression, anxiety, insomnia.    Vitals:  BP 128/74  Pulse 72  Temp 98.6 F (37 C) (Oral)  Resp 16  Ht 5\' 10"  (1.778 m)  Wt 312 lb 12.8 oz (141.885 kg)  BMI 44.88 kg/m2   Physical Exam: WD female in no acute distress Neck   Supple without any enlargements.  Lymph node survey. No cervical supraclavicular cervical or inguinal adenopathy Cardiovascular   Pulse normal rate, regularity and rhythm. S1 and S2 normal. Lungs   Clear to auscultation bilateraly, without wheezes/crackles/rhonchi. Good air movement.   Skin   No rash/lesions/breakdown   Psychiatry   Alert and oriented to person, place, and time  Back No CVA tenderness Abdomen   Normoactive bowel sounds, abdomen soft, non-tender and obese.  Genito Urinary   Vulva/vagina: Normal external female genitalia.  No lesions.                Bladder/urethra:  No lesions or masses             Vagina: No palpable lesions blood is noted within the vaginal vault.. The cervix is small 2 cm mobile no parametrial adenopathy  Uterus: Mobile and endometrial biopsy was collected the uterus sounded to 8 cm  Extremities   No bilateral cyanosis, 1-2+ edema.       Assessment/Plan:   This is a 41 y.o. year old with metastatic gestational trophoblastic neoplasia. She's completed treatment 06/2011 with normalization of her beta hCG  Given the residual lung lesion the plan is for observation for 18 months until 11/2012  Abnormal uterine bleeding: An endometrial biopsy was collected today followup with the results.  Follow up with Dr. Darrold Span in February 2014 , Dr. Normand Sloop in 6 months  GYN oncology in December 2014

## 2012-10-25 ENCOUNTER — Telehealth: Payer: Self-pay | Admitting: Oncology

## 2012-10-25 NOTE — Telephone Encounter (Signed)
s/w pt and moved her appt to 1/28 as she is on call 1/29    anne

## 2012-10-28 ENCOUNTER — Other Ambulatory Visit: Payer: Self-pay | Admitting: Lab

## 2012-11-03 IMAGING — XA IR FLUORO GUIDE CV LINE*R*
1 series · 1 of 1 positions shown · non-contrast
Comparison: none

CLINICAL DATA: Cancer

[Series 300: line placements · 1 of 1 slices shown]
[im 1/1]
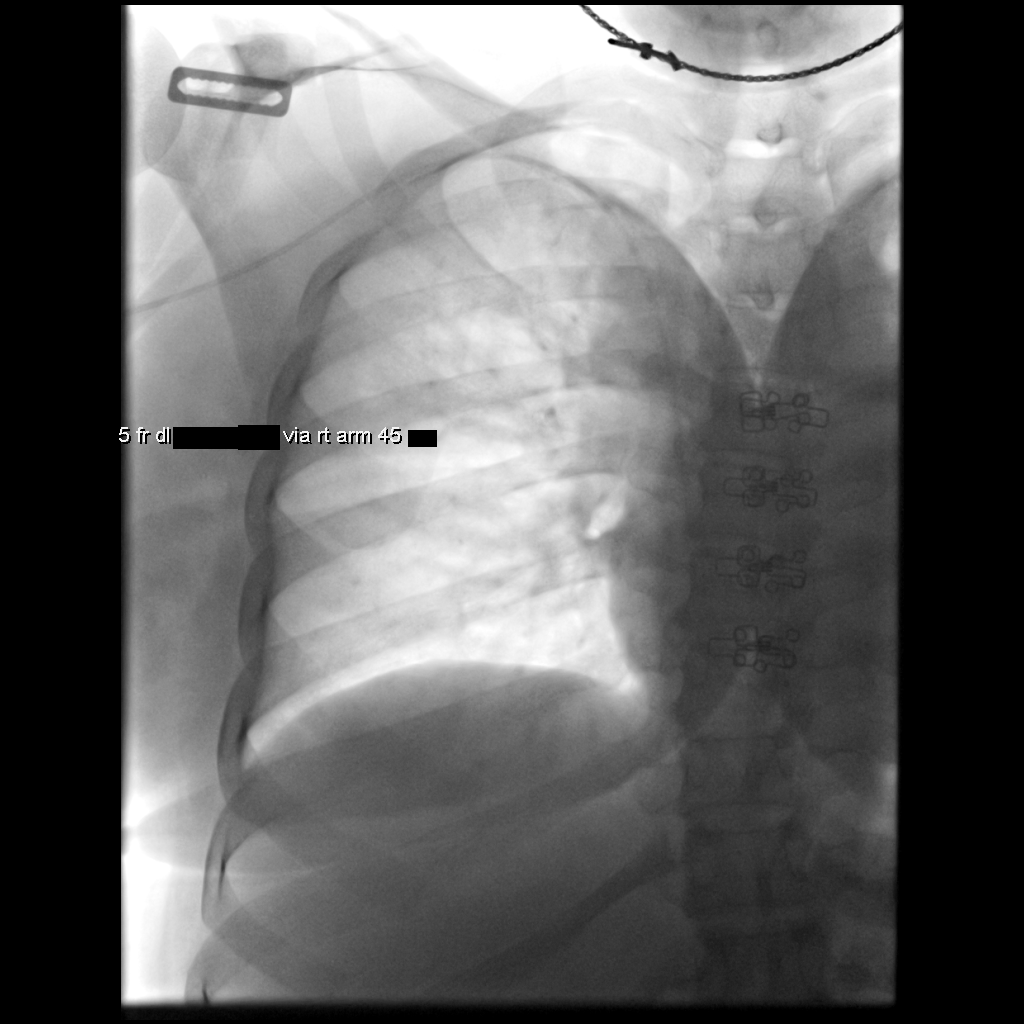

[1 of 1 positions shown; findings below may reference images not displayed]

PICC LINE PLACEMENT WITH ULTRASOUND AND FLUOROSCOPIC  GUIDANCE

Fluoroscopy Time: 1.1 minutes.

The right arm was prepped with chlorhexidine, draped in the usual
sterile fashion using maximum barrier technique (cap and mask,
sterile gown, sterile gloves, large sterile sheet, hand hygiene and
cutaneous antisepsis) and infiltrated locally with 1% Lidocaine.

Ultrasound demonstrated patency of the right basilic vein, and this
was documented with an image.  Under real-time ultrasound guidance,
this vein was accessed with a 21 gauge micropuncture needle and
image documentation was performed.  The needle was exchanged over a
guidewire for a peel-away sheath through which a five French single
lumen PICC trimmed to 45 cm was advanced, positioned with its tip
at the lower SVC/right atrial junction.  Fluoroscopy during the
procedure and fluoro spot radiograph confirms appropriate catheter
position.  The catheter was flushed, secured to the skin with
Prolene sutures, and covered with a sterile dressing.

Complications:  None
IMPRESSION: Successful right arm PICC line placement with ultrasound and
fluoroscopic guidance.  The catheter is ready for use.

## 2012-11-05 ENCOUNTER — Telehealth: Payer: Self-pay | Admitting: *Deleted

## 2012-11-05 MED ORDER — LISINOPRIL 10 MG PO TABS: 10.0000 mg | ORAL_TABLET | Freq: Every day | ORAL | Status: DC

## 2012-11-05 NOTE — Telephone Encounter (Signed)
Lisinopril 10 mg  #30  1 po qd ----ov in 2-3 weeks

## 2012-11-05 NOTE — Telephone Encounter (Signed)
Pt is requesting to change norvasc to lisinopril which is on $4 list at [facility]. .Please advise

## 2012-11-23 ENCOUNTER — Telehealth: Payer: Self-pay | Admitting: Oncology

## 2012-11-23 ENCOUNTER — Other Ambulatory Visit: Payer: BC Managed Care – PPO | Admitting: Lab

## 2012-11-23 ENCOUNTER — Ambulatory Visit: Payer: BC Managed Care – PPO | Admitting: Oncology

## 2012-11-23 NOTE — Telephone Encounter (Signed)
pt called in to r/s her appt today due to weather and school closings,r/s     [name]

## 2012-11-23 NOTE — Telephone Encounter (Signed)
pt needed to r/s appt...Done °

## 2012-11-24 ENCOUNTER — Ambulatory Visit: Payer: Self-pay | Admitting: Oncology

## 2012-11-24 ENCOUNTER — Other Ambulatory Visit: Payer: Self-pay | Admitting: Lab

## 2012-11-27 IMAGING — US US PELVIS COMPLETE
1 series · 13 of 25 positions shown · non-contrast
Comparison: 02/21/2011

CLINICAL DATA: Gestational trophoblastic disease, pelvic pain.
Currently on chemotherapy

TRANSABDOMINAL AND TRANSVAGINAL ULTRASOUND OF PELVIS
TECHNIQUE: Both transabdominal and transvaginal ultrasound
examinations of the pelvis were performed. Transabdominal technique
was performed for global imaging of the pelvis including uterus,
ovaries, adnexal regions, and pelvic cul-de-sac.

[Series 1: us pelvis complete · 13 of 39 slices shown]
[im 1/39]
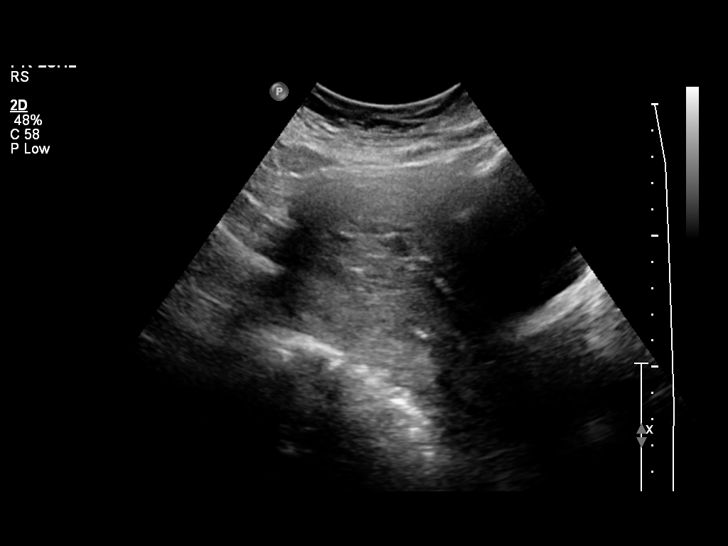
[im 4/39]
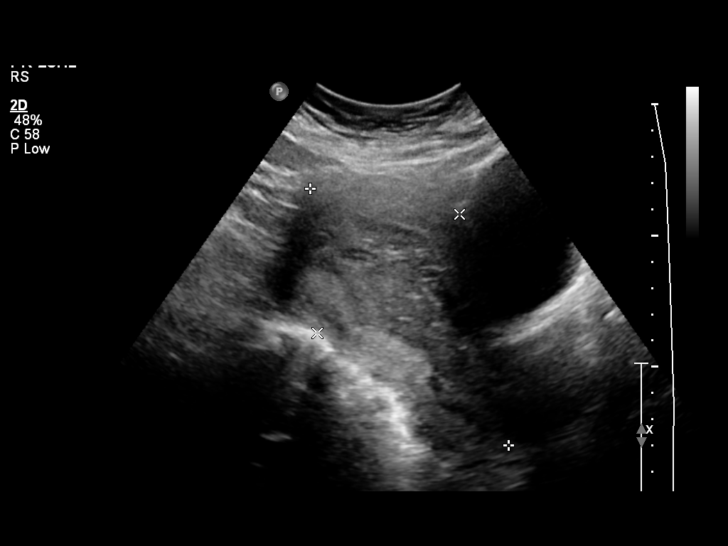
[im 7/39]
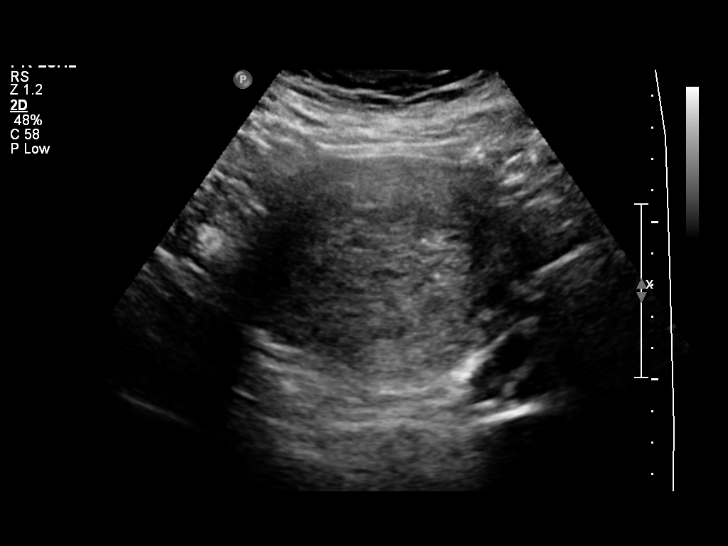
[im 10/39]
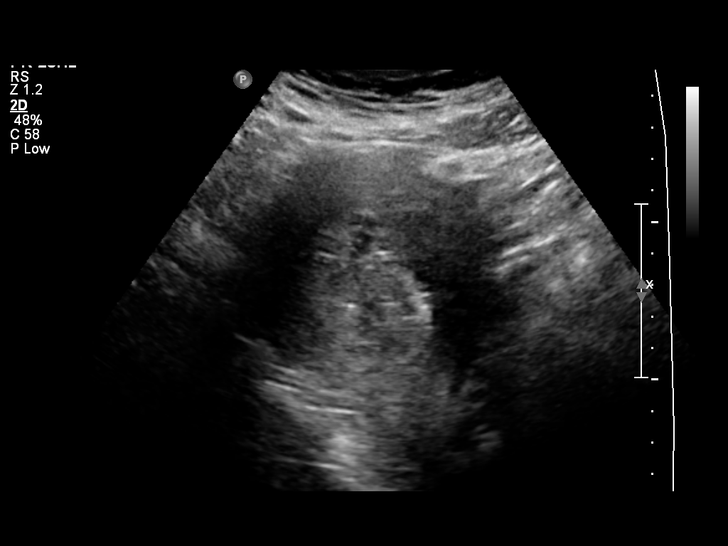
[im 13/39]
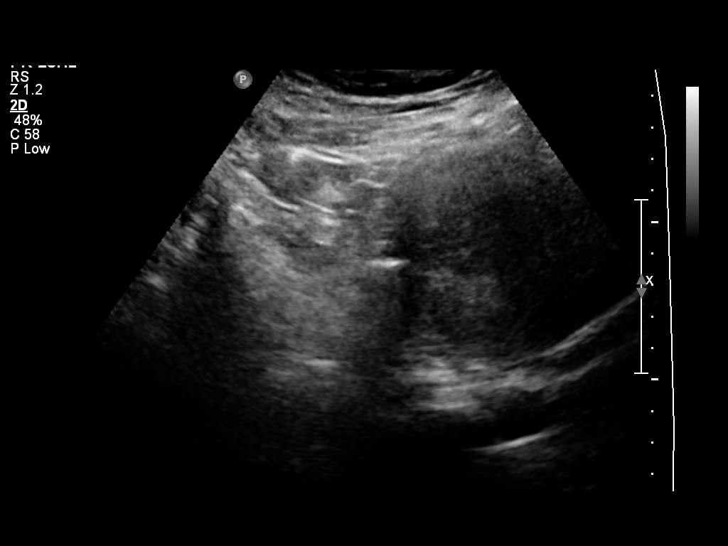
[im 16/39]
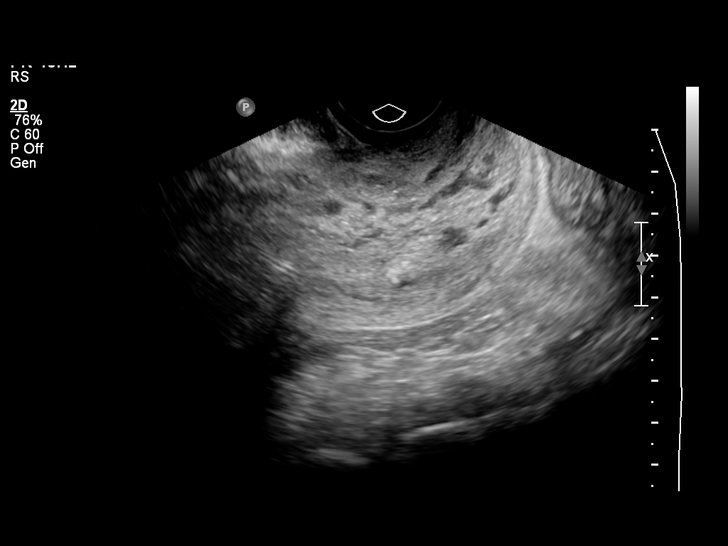
[im 20/39]
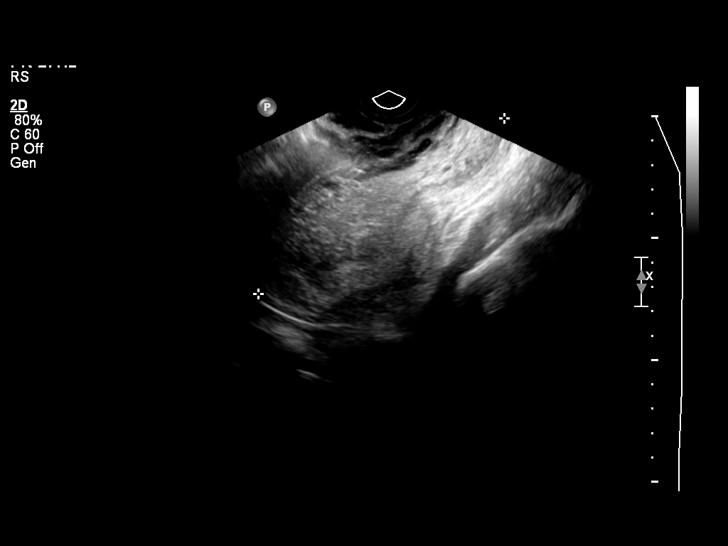
[im 23/39]
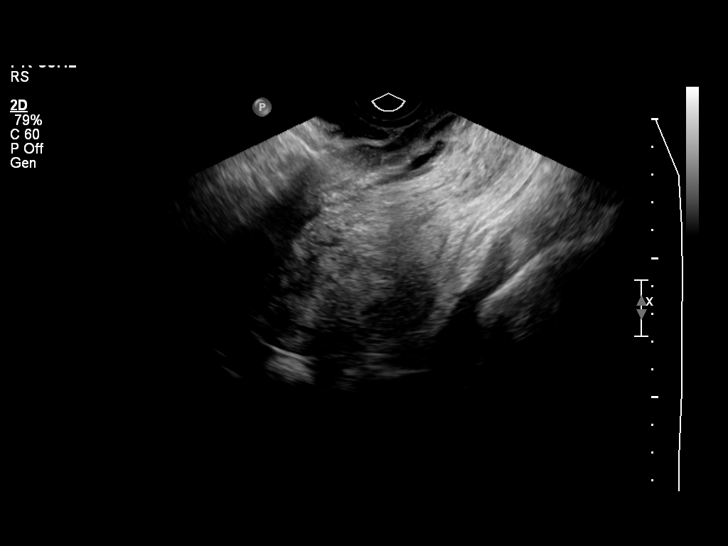
[im 26/39]
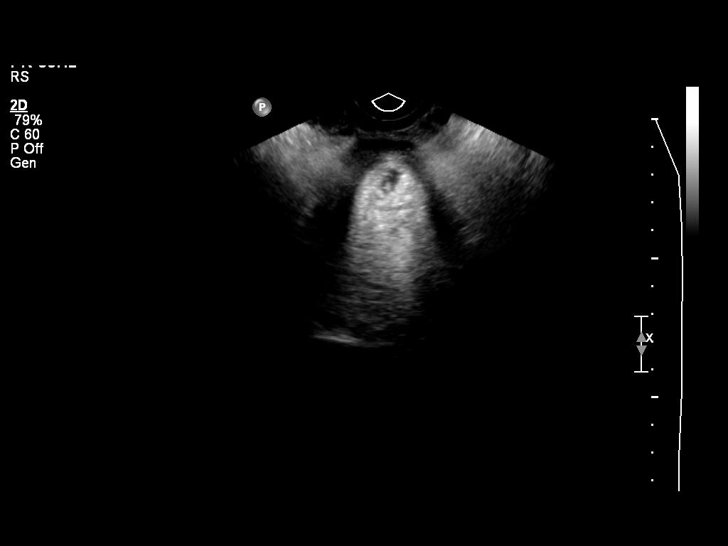
[im 29/39]
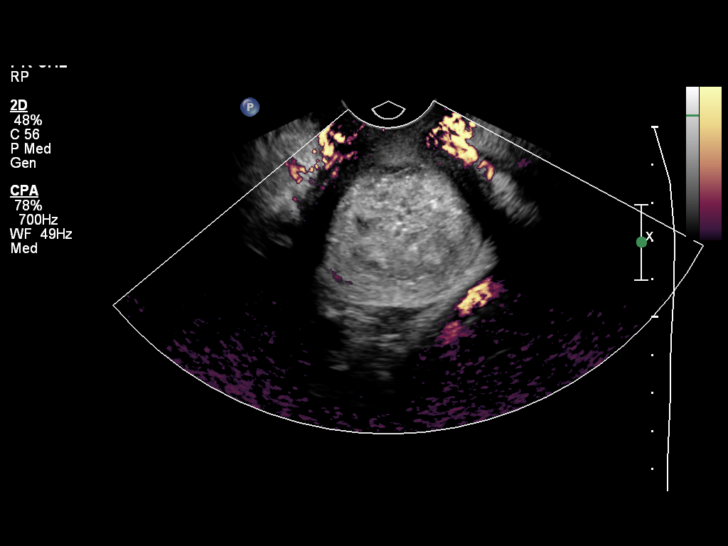
[im 32/39]
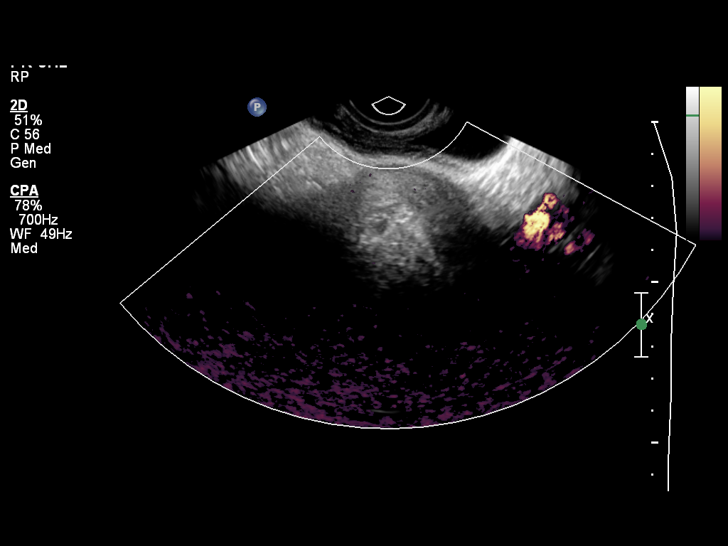
[im 35/39]
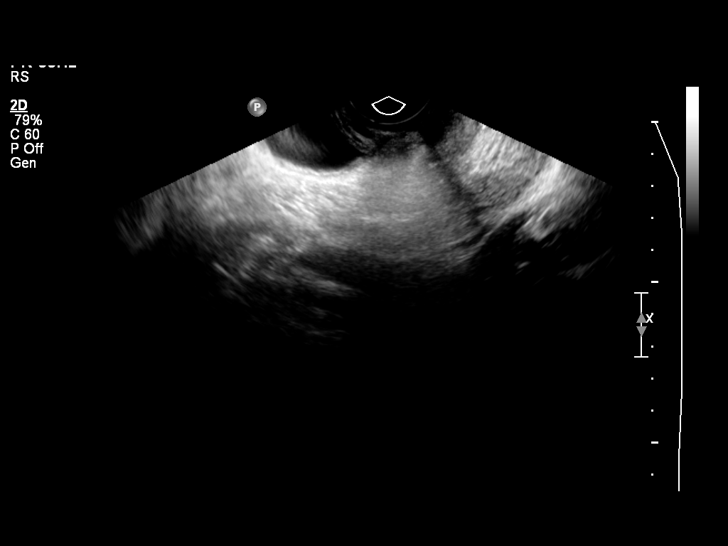
[im 39/39]
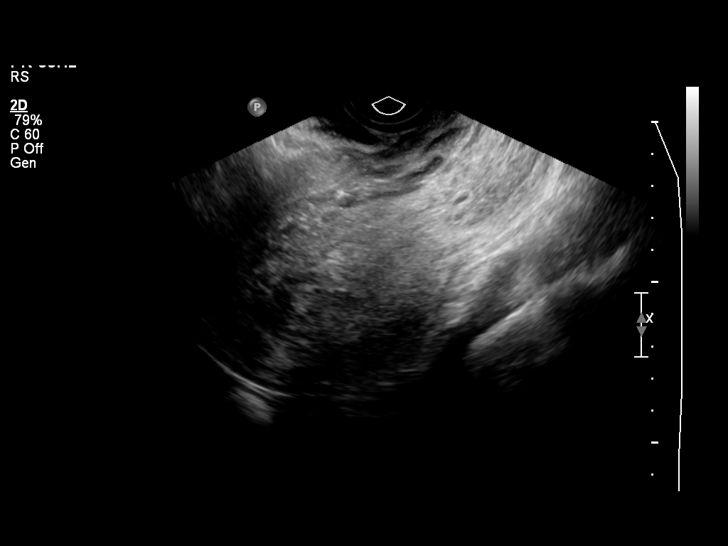

[13 of 25 positions shown; findings below may reference images not displayed]

It was necessary to proceed with endovaginal exam following the
transabdomnial exam to visualize the endometrium and ovaries,
neither seen transabdominally.
FINDINGS: Uterus: 12.3 x 8.6 x 7.0 cm.  Anteverted, minimally retroflexed.

Endometrium: 3.9 cm, with persistent innumerable cystic spaces but
no internal hypervascularity or measurable focal abnormality.

Right ovary:  Not visualized.  No adnexal mass.

Left ovary: Not visualized.  No adnexal mass.

Other findings: No free fluid
IMPRESSION: Persistently thickened endometrium with innumerable cystic spaces,
compatible with the given diagnosis of gestational trophoblastic
disease, but with interval decrease in thickness suggestive of
response to chemotherapy.   Lack of internal color flow may also
suggest response to chemotherapy, and decreases the likelihood that
there is invasion.  However, MRI with contrast [REDACTED]fer greater
sensitivity / specificity for detection of invasive disease if
needed.

Findings discussed with Dr. Aubrie by Dr. Gurtovets on 04/19/2011 at

## 2012-12-01 ENCOUNTER — Encounter: Payer: Self-pay | Admitting: Oncology

## 2012-12-01 NOTE — Progress Notes (Signed)
Medical Oncology  [employer] form received re [employer] program, with note from patient requesting I email response rather than returning form via fax. Email sent to [email or web address] noting that patient is cleared to exercise with no restrictions.  L.Davidmichael Zarazua MD

## 2012-12-07 ENCOUNTER — Other Ambulatory Visit: Payer: BC Managed Care – PPO | Admitting: Lab

## 2012-12-07 ENCOUNTER — Telehealth: Payer: Self-pay

## 2012-12-07 ENCOUNTER — Ambulatory Visit: Payer: BC Managed Care – PPO | Admitting: Oncology

## 2012-12-07 NOTE — Telephone Encounter (Signed)
[name] states that she has noticed a knot in her left palm/wrist area the size of a pea in the last ~2 weeks.  The knot comes and goes.  No pain or swelling.  She is concerned that it could be a clot.  Told her that it could be a cyst.   Suggested the next time she feels the area that she should call her PCP Dr. Loann Quill and have her evaluate the area.  [name] verbalized understanding.

## 2012-12-11 ENCOUNTER — Other Ambulatory Visit: Payer: Self-pay

## 2012-12-11 ENCOUNTER — Other Ambulatory Visit: Payer: Self-pay | Admitting: Oncology

## 2012-12-11 DIAGNOSIS — O019 Hydatidiform mole, unspecified: Secondary | ICD-10-CM

## 2012-12-15 ENCOUNTER — Other Ambulatory Visit: Payer: BC Managed Care – PPO | Admitting: Lab

## 2012-12-15 ENCOUNTER — Ambulatory Visit: Payer: BC Managed Care – PPO | Admitting: Oncology

## 2012-12-17 ENCOUNTER — Encounter: Payer: Self-pay | Admitting: Oncology

## 2012-12-17 ENCOUNTER — Telehealth: Payer: Self-pay | Admitting: *Deleted

## 2012-12-17 ENCOUNTER — Other Ambulatory Visit: Payer: Self-pay | Admitting: Oncology

## 2012-12-17 DIAGNOSIS — O019 Hydatidiform mole, unspecified: Secondary | ICD-10-CM

## 2012-12-17 NOTE — Telephone Encounter (Signed)
Called patient regarding missed labs and appointment with Dr Darrold Span. States she had cancelled appt because she was dropped by her insurance. Instructed her to call [facility] [phone] and speak with financial counselors. And to call us back if she has questions.

## 2012-12-17 NOTE — Progress Notes (Signed)
Medical Oncology note of information  Patient [facility] appointments for BHCG 1-2, 1-29 and MD + lab 12-06-12.  I have requested RN and scheduler try to speak with her directly. POF to scheduler for HCG, CBC, CMET today or next week and to reschedule with MD next available that patient can keep.  Ila Mcgill, MD

## 2012-12-17 NOTE — Progress Notes (Signed)
Pt called with insurance concerns.  Her BCBS will soon term and she needed suggestions on what to do.  I suggested that she apply for Medicaid which she said she did and does not qualify because of disability she is receiving for herself and her child.  I also suggested that she apply for Obama care.  She said she tried but was denied and told she qualifies for Medicaid.  I offered to call DSS in her behalf to see what is needed to get her approved.  She gave me Trenton Gammon (supervisor) number [phone].  I called but was unable to get anywhere because the pt needs to fill out a release with DSS.  I relayed this message to her and she said she will go down on Monday to fill out a release.  In the meantime, I will mail her an EPP application to see if she qualifies for assistance.

## 2012-12-20 ENCOUNTER — Telehealth: Payer: Self-pay | Admitting: Oncology

## 2012-12-20 NOTE — Telephone Encounter (Signed)
Pt returned call and changed time for 2/27 lb to 10:30am. Pt also have 3/3 appt for 11:30am. Per pt she did not intend on r/s appts due to she still has not gotten her ins yet. Pt has agreed to keep her appts and s/w the financial counselors when she comes in on 2/27.

## 2012-12-20 NOTE — Telephone Encounter (Signed)
lmonvm for pt re appts for 2/27 and 3/3. Pt asked to call back ASAP to confirm appts. Schedule mailed (2nd call).

## 2012-12-20 NOTE — Telephone Encounter (Signed)
, °

## 2012-12-21 ENCOUNTER — Encounter: Payer: Self-pay | Admitting: Family Medicine

## 2012-12-21 ENCOUNTER — Ambulatory Visit (INDEPENDENT_AMBULATORY_CARE_PROVIDER_SITE_OTHER): Payer: Self-pay | Admitting: Family Medicine

## 2012-12-21 VITALS — BP 126/94 | HR 96 | Temp 98.4°F | Wt 320.8 lb

## 2012-12-21 DIAGNOSIS — E119 Type 2 diabetes mellitus without complications: Secondary | ICD-10-CM

## 2012-12-21 DIAGNOSIS — I10 Essential (primary) hypertension: Secondary | ICD-10-CM

## 2012-12-21 DIAGNOSIS — T23009A Burn of unspecified degree of unspecified hand, unspecified site, initial encounter: Secondary | ICD-10-CM

## 2012-12-21 DIAGNOSIS — Z23 Encounter for immunization: Secondary | ICD-10-CM

## 2012-12-21 DIAGNOSIS — T300 Burn of unspecified body region, unspecified degree: Secondary | ICD-10-CM

## 2012-12-21 LAB — BASIC METABOLIC PANEL
BUN: 11 mg/dL (ref 6–23)
Calcium: 8.9 mg/dL (ref 8.4–10.5)
Creatinine, Ser: 0.9 mg/dL (ref 0.4–1.2)
GFR: 85.49 mL/min (ref >60.00)
Glucose, Bld: 90 mg/dL (ref 70–99)

## 2012-12-21 LAB — LIPID PANEL
HDL: 49.3 mg/dL (ref >39.00)
Total CHOL/HDL Ratio: 3

## 2012-12-21 LAB — MICROALBUMIN / CREATININE URINE RATIO: Microalb, Ur: 1.5 mg/dL (ref 0.0–1.9)

## 2012-12-21 LAB — POCT URINALYSIS DIPSTICK
Ketones, UA: NEGATIVE
Leukocytes, UA: NEGATIVE
Nitrite, UA: NEGATIVE
Protein, UA: NEGATIVE
Urobilinogen, UA: 0.2
pH, UA: 6

## 2012-12-21 LAB — HEPATIC FUNCTION PANEL
AST: 15 U/L (ref 0–37)
Alkaline Phosphatase: 45 U/L (ref 39–117)
Bilirubin, Direct: 0 mg/dL (ref 0.0–0.3)
Total Bilirubin: 0.5 mg/dL (ref 0.3–1.2)

## 2012-12-21 IMAGING — US IR US GUIDE VASC ACCESS LEFT
1 series · 1 of 1 positions shown · non-contrast
Comparison: none

CLINICAL DATA: Gestational triple blastic disease, access for
chemotherapy

[Series 1: sp percut gastrostomy tube insert w/fluoro · 1 of 1 slices shown]
[im 1/1]
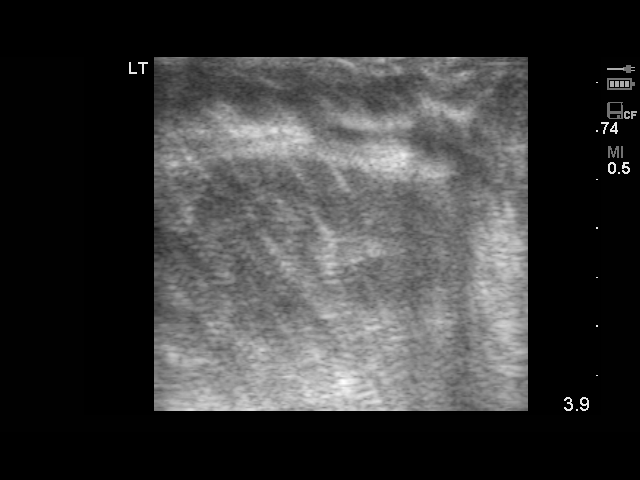

[1 of 1 positions shown; findings below may reference images not displayed]

PICC LINE PLACEMENT WITH ULTRASOUND AND FLUOROSCOPIC  GUIDANCE

Fluoroscopy Time: 0.1 minutes.

The left arm was prepped with chlorhexidine, draped in the usual
sterile fashion using maximum barrier technique (cap and mask,
sterile gown, sterile gloves, large sterile sheet, hand hygiene and
cutaneous antisepsis) and infiltrated locally with 1% Lidocaine.

Ultrasound demonstrated patency of the left brachial vein, and this
was documented with an image.  Under real-time ultrasound guidance,
this vein was accessed with a 21 gauge micropuncture needle and
image documentation was performed.  The needle was exchanged over a
guidewire for a peel-away sheath through which a 5 French single
lumen PICC trimmed to appropriate length and was advanced,
positioned with its tip at the lower SVC/right atrial junction.
Fluoroscopy during the procedure and fluoro spot radiograph
confirms appropriate catheter position.  The catheter was flushed,
secured to the skin with Prolene sutures, and covered with a
sterile dressing.

Complications:  No immediate
IMPRESSION: Successful left arm PICC line placement with ultrasound and
fluoroscopic guidance.  The catheter is ready for use.

## 2012-12-21 MED ORDER — METFORMIN HCL ER 500 MG PO TB24: ORAL_TABLET | ORAL | Status: DC

## 2012-12-21 MED ORDER — LISINOPRIL 10 MG PO TABS: 10.0000 mg | ORAL_TABLET | Freq: Every day | ORAL | Status: DC

## 2012-12-21 MED ORDER — CARVEDILOL 3.125 MG PO TABS: 3.1250 mg | ORAL_TABLET | Freq: Two times a day (BID) | ORAL | Status: DC

## 2012-12-21 NOTE — Patient Instructions (Signed)
Burn Care  Your skin is a natural barrier to infection. It is the largest organ of your body. Burns damage this natural protection. To help prevent infection, it is very important to follow your caregiver's instructions in the care of your burn.  Burns are classified as:  · First degree. There is only redness of the skin (erythema). No scarring is expected.  · Second degree. There is blistering of the skin. Scarring may occur with deeper burns.  · Third degree. All layers of the skin are injured, and scarring is expected.  HOME CARE INSTRUCTIONS   · Wash your hands well before changing your bandage.  · Change your bandage as often as directed by your caregiver.  · Remove the old bandage. If the bandage sticks, you may soak it off with cool, clean water.  · Cleanse the burn thoroughly but gently with mild soap and water.  · Pat the area dry with a clean, dry cloth.  · Apply a thin layer of antibacterial cream to the burn.  · Apply a clean bandage as instructed by your caregiver.  · Keep the bandage as clean and dry as possible.  · Elevate the affected area for the first 24 hours, then as instructed by your caregiver.  · Only take over-the-counter or prescription medicines for pain, discomfort, or fever as directed by your caregiver.  SEEK IMMEDIATE MEDICAL CARE IF:   · You develop excessive pain.  · You develop redness, tenderness, swelling, or red streaks near the burn.  · The burned area develops yellowish-white fluid (pus) or a bad smell.  · You have a fever.  MAKE SURE YOU:   · Understand these instructions.  · Will watch your condition.  · Will get help right away if you are not doing well or get worse.  Document Released: 10/13/2005 Document Revised: 01/05/2012 Document Reviewed: 03/05/2011  ExitCare® Patient Information ©2013 [employer].

## 2012-12-21 NOTE — Assessment & Plan Note (Signed)
Healing well.

## 2012-12-21 NOTE — Progress Notes (Signed)
  Subjective:     [name] is a 42 y.o. female who presents for follow up of diabetes.. Current symptoms include: none. Patient denies foot ulcerations, hyperglycemia, hypoglycemia , increased appetite, nausea, paresthesia of the feet, polydipsia, polyuria, visual disturbances, vomiting, weight loss and she is not checking her BS.. Evaluation to date has been: fasting blood sugar, fasting lipid panel, hemoglobin A1C and but she is overdue for labs. Home sugars: patient does not check sugars. Current treatments: pt has run out of meds. . Pt also c/o burn on L hand-- fingers and palm.  Pt states it has improved a lot she just wants [place] to take a look.  Hot butter was accidentally spilt on her hand. The following portions of the patient's history were reviewed and updated as appropriate: allergies, current medications, past family history, past medical history, past social history, past surgical history and problem list.  [name] Pertinent items are noted in HPI.    Objective:    BP 126/94  Pulse 96  Temp(Src) 98.4 F (36.9 C) (Oral)  Wt 320 lb 12.8 oz (145.514 kg)  BMI 46.03 kg/m2  SpO2 95% General appearance: alert, cooperative, appears stated age and no distress Ears: normal TM's and external ear canals both ears Nose: Nares normal. Septum midline. Mucosa normal. No drainage or sinus tenderness. Throat: lips, mucosa, and tongue normal; teeth and gums normal Neck: no adenopathy, supple, symmetrical, trachea midline and thyroid not enlarged, symmetric, no tenderness/mass/nodules Lungs: clear to auscultation bilaterally Heart: S1, S2 normal Extremities: extremities normal, atraumatic, no cyanosis or edema Skin: still blister on pad on middle finger  Patient was not evaluated for proper footwear and sizing.  Laboratory: No components found with this basename: A1C      Assessment:    Diabetes mellitus Type II, under unknown control.    Plan:    Discussed general  issues about diabetes pathophysiology and management. Counseling at today's visit: discussed the need for weight loss, discussed the advantages of a diet low in carbohydrates and reminded to check sugars regularly and to bring readings in at the time of the next visit. Addressed ADA diet. Encouraged aerobic exercise. Discussed foot care. Reminded to get yearly retinal exam. Restarted metformin; see  medication orders. Labs: fasting blood sugar, fasting lipid panel, hemoglobin A1C and microalbuminuria. Reminded to bring in blood sugar diary at next visit. Follow up in 3 months or as needed.

## 2012-12-23 ENCOUNTER — Other Ambulatory Visit (HOSPITAL_BASED_OUTPATIENT_CLINIC_OR_DEPARTMENT_OTHER): Payer: Self-pay | Admitting: Lab

## 2012-12-23 ENCOUNTER — Telehealth: Payer: Self-pay | Admitting: *Deleted

## 2012-12-23 DIAGNOSIS — O019 Hydatidiform mole, unspecified: Secondary | ICD-10-CM

## 2012-12-23 LAB — CBC WITH DIFFERENTIAL/PLATELET
BASO%: 0.7 % (ref 0.0–2.0)
EOS%: 1.8 % (ref 0.0–7.0)
MCH: 22.1 pg — ABNORMAL LOW (ref 25.1–34.0)
MCHC: 31.5 g/dL (ref 31.5–36.0)
MONO#: 0.4 10*3/uL (ref 0.1–0.9)
NEUT%: 52.5 % (ref 38.4–76.8)
RBC: 5.2 10*6/uL (ref 3.70–5.45)
RDW: 16.6 % — ABNORMAL HIGH (ref 11.2–14.5)
WBC: 5.9 10*3/uL (ref 3.9–10.3)
lymph#: 2.2 10*3/uL (ref 0.9–3.3)

## 2012-12-23 LAB — COMPREHENSIVE METABOLIC PANEL (CC13)
ALT: 14 U/L (ref 0–55)
AST: 14 U/L (ref 5–34)
Albumin: 3.1 g/dL — ABNORMAL LOW (ref 3.5–5.0)
Alkaline Phosphatase: 48 U/L (ref 40–150)
BUN: 9.9 mg/dL (ref 7.0–26.0)
Calcium: 9.1 mg/dL (ref 8.4–10.4)
Chloride: 108 mEq/L — ABNORMAL HIGH (ref 98–107)
Potassium: 3.6 mEq/L (ref 3.5–5.1)

## 2012-12-23 LAB — HCG, QUANTITATIVE, PREGNANCY: hCG, Beta Chain, Quant, S: <2 m[IU]/mL

## 2012-12-23 MED ORDER — CARVEDILOL 6.25 MG PO TABS: 6.2500 mg | ORAL_TABLET | Freq: Two times a day (BID) | ORAL | Status: DC

## 2012-12-23 NOTE — Telephone Encounter (Signed)
We did not inc dose----- who did?   Ok to send 6.25 mg bid because bp was slightly high

## 2012-12-23 NOTE — Telephone Encounter (Signed)
The cardiologist at [facility] changed the Rx in Sept 2013 and she has been having this dose filled since from that office. Patient verified her medication as the 3.125 at her Office visit. I will go ahead and send the Rx for the 6.25

## 2012-12-23 NOTE — Telephone Encounter (Signed)
Pt states that she was suppose to be on 6.25 mg of the coreg instead of the 3.125 mg of the coreg. Please clarify strength and send in new Rx

## 2012-12-24 ENCOUNTER — Telehealth: Payer: Self-pay | Admitting: *Deleted

## 2012-12-24 NOTE — Telephone Encounter (Signed)
Message copied by Phillis Knack on Fri Dec 24, 2012  2:37 PM ------      Message from: Lorine Bears      Created: Fri Dec 24, 2012  2:35 PM                   ----- Message -----         From: Reece Packer, MD         Sent: 12/24/2012   8:22 AM           To: Lorine Bears, RN            Labs seen and need follow up: please let her knoe BHCG good at <2 and rest of labs generally good. Apt next week 3-3 ------

## 2012-12-24 NOTE — Telephone Encounter (Signed)
Notified patient of labs below. Reminded of appointment on 3/3. Pt states she will be here.

## 2012-12-27 ENCOUNTER — Telehealth: Payer: Self-pay | Admitting: Oncology

## 2012-12-27 ENCOUNTER — Ambulatory Visit (HOSPITAL_BASED_OUTPATIENT_CLINIC_OR_DEPARTMENT_OTHER): Payer: Self-pay

## 2012-12-27 ENCOUNTER — Encounter: Payer: Self-pay | Admitting: Oncology

## 2012-12-27 ENCOUNTER — Ambulatory Visit (HOSPITAL_BASED_OUTPATIENT_CLINIC_OR_DEPARTMENT_OTHER): Payer: Self-pay | Admitting: Oncology

## 2012-12-27 VITALS — BP 121/79 | HR 84 | Temp 98.2°F | Resp 22 | Ht 70.0 in | Wt 325.2 lb

## 2012-12-27 DIAGNOSIS — O019 Hydatidiform mole, unspecified: Secondary | ICD-10-CM

## 2012-12-27 DIAGNOSIS — E119 Type 2 diabetes mellitus without complications: Secondary | ICD-10-CM

## 2012-12-27 DIAGNOSIS — D509 Iron deficiency anemia, unspecified: Secondary | ICD-10-CM

## 2012-12-27 MED ORDER — MEDROXYPROGESTERONE ACETATE 150 MG/ML IM SUSP
150.0000 mg | Freq: Once | INTRAMUSCULAR | Status: AC
  Administered 2012-12-27: 150 mg via INTRAMUSCULAR
  Filled 2012-12-27: qty 1

## 2012-12-27 NOTE — Telephone Encounter (Signed)
gv and printed appt schedule for jpt for March 2014 and 2015....tried to schedule pt with Dr. Normand Sloop and the office does not have a schedule after the end of march...pt will call office and schedule appt

## 2012-12-27 NOTE — Patient Instructions (Signed)
We will give depoProvera today, which will be good for 3 months. You need to see Dr Normand Sloop (or Dr Laury Axon) in June and should discuss choice of contraceptives from there with that MD.   Continue regular exercise. Need to start good diet in addition.

## 2012-12-27 NOTE — Progress Notes (Signed)
OFFICE PROGRESS NOTE   12/27/2012   Physicians:W.Nelly Rout, Y.Urban Gibson, Lucienne Capers (cardiology, [facility]), Naima Dillard   INTERVAL HISTORY:   Patient is seen, alone for visit, this rescheduled after patient missed lab appointments for [facility] on 1-2 and 11-24-12 and MD/lab on 12-06-12. She is on follow up for history of gestational trophoblastic disease, with monthly HCGs planned x 18 months, thru Feb 2014. Last depoProvera was 09-01-12.  She saw Dr Nelly Rout in Dec.2013 with year follow up at that office, and is to see Dr Normand Sloop in June. She had benign endometrial biopsy by Dr Nelly Rout 10-12-12 [phone]). Primary care is by Dr Laury Axon, whom she sees every 6 months.   History is of presumed pregnancy in Jan. 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 She had rising BHCG after the D&C which did not improve significantly with initial methotrexate. CT chest/abd/pelvis 03-20-11 had small pulmonary nodules (at least 4 in left and at least 3 on right) as well as large intrauterine mass. She was seen by Dr.Brewster and received EMA-CO x 6 cycles thru August 2012. 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 B HCGs have remained <2.0. Course was complicated by DVT at site of initial RUE PICC, 3 months of coumadin completed in midOct 2012. Repeat CT chest 09-30-2011 showed only stable 3 mm RUL nodule. Recommendation for 18 month surveillance was based on review by Dr Ronita Hipps. As patient had missed scheduled BHCG labs in Jan and Feb, all labs were drawn 12-23-12 prior to this visit, with BHCG <2.0. This will be last planned BHCG evaluation. Paitent had missed labs and MD visit as she is now unemployed and without insurance. I do not find documentation that [facility] financial staff has met with her and I will try to follow up.  Patient  has no complaints that seem related to the GTD or that treatment. She has not been able to lose weight, but [facility] [employer] [facility] and is also swimming for 30 min 3x weekly; she is not following diet (burgers and fries yesterday). We have discussed need for weight loss to ideal, combining diet and exercise. I have encouraged her to walk in pool additional 30 min after swimming. She has mild UTI symptoms past few days, no fever, no lower respiratory symptoms. She denies abdominal or pelvic pain, bleeding, change in bowels. Remainder of 10 point [name] negative.  Objective:  Vital signs in last 24 hours:  BP 121/79  Pulse 84  Temp(Src) 98.2 F (36.8 C) (Oral)  Resp 22  Ht 5\' 10"  (1.778 m)  Wt 325 lb 3.2 oz (147.51 kg)  BMI 46.66 kg/m2 Weight is up 23 lbs from my last visit in Aug 2013. Ambulatory, looks comfortable.   HEENT:PERRLA, sclera clear, anicteric and oropharynx clear, no lesions LymphaticsCervical, supraclavicular, and axillary nodes normal. Resp: clear to auscultation bilaterally and normal percussion bilaterally Cardio: regular rate and rhythm GI: obese, soft, not tender, cannot appreciate organomegaly or mass Extremities: extremities normal, atraumatic, no cyanosis or edema Breast:normal without suspicious masses, skin or nipple changes or axillary nodes Skin without rash or ecchymosis  Lab Results:  Results for orders placed in visit on 12/23/12  CBC WITH DIFFERENTIAL      Result Value Range   WBC 5.9  3.9 -  10.3 10e3/uL   NEUT# 3.1  1.5 - 6.5 10e3/uL   HGB 11.5 (*) 11.6 - 15.9 g/dL   HCT 81.1  91.4 - 78.2 %   Platelets 178  145 - 400 10e3/uL   MCV 70.3 (*) 79.5 - 101.0 fL   MCH 22.1 (*) 25.1 - 34.0 pg   MCHC 31.5  31.5 - 36.0 g/dL   RBC 9.56  2.13 - 0.86 10e6/uL   RDW 16.6 (*) 11.2 - 14.5 %   lymph# 2.2  0.9 - 3.3 10e3/uL   MONO# 0.4  0.1 - 0.9 10e3/uL   Eosinophils Absolute 0.1  0.0 - 0.5 10e3/uL   Basophils Absolute 0.0  0.0 -  0.1 10e3/uL   NEUT% 52.5  38.4 - 76.8 %   LYMPH% 38.0  14.0 - 49.7 %   MONO% 7.0  0.0 - 14.0 %   EOS% 1.8  0.0 - 7.0 %   BASO% 0.7  0.0 - 2.0 %  HCG, QUANTITATIVE, PREGNANCY      Result Value Range   hCG, Beta Chain, Quant, S <2.0    COMPREHENSIVE METABOLIC PANEL (CC13)      Result Value Range   Sodium 141  136 - 145 mEq/L   Potassium 3.6  3.5 - 5.1 mEq/L   Chloride 108 (*) 98 - 107 mEq/L   CO2 26  22 - 29 mEq/L   Glucose 111 (*) 70 - 99 mg/dl   BUN 9.9  7.0 - 57.8 mg/dL   Creatinine 0.9  0.6 - 1.1 mg/dL   Total Bilirubin 4.69  0.20 - 1.20 mg/dL   Alkaline Phosphatase 48  40 - 150 U/L   AST 14  5 - 34 U/L   ALT 14  0 - 55 U/L   Total Protein 7.4  6.4 - 8.3 g/dL   Albumin 3.1 (*) 3.5 - 5.0 g/dL   Calcium 9.1  8.4 - 62.9 mg/dL    Hgb better tho MCV still low, ok to continue oral iron. When anemia resolves, will not need to continue the iron.  B HCG 12-23-12 <2.0  Studies/Results:  Last CT chest was Dec 2012  Mammograms  08-23-12  Medications: I have reviewed the patient's current medications.    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 Will stop monthly labs now. She will see Dr Normand Sloop in June, Dr Nelly Rout in Dec 2014 and I will see again in a year due to the previous chemotherapy and her iron deficiency (cbc, cmet,iron studies with my visit). 2.morbid obesity: [facility] Needs to improve diet also. 3.diabetes: as #2 4.HTN 5.iron deficiency anemia: gradually improving. Plan as above   Patient followed discussion well.  Reece Packer, MD   12/27/2012, 12:48 PM

## 2013-01-05 ENCOUNTER — Ambulatory Visit: Payer: Self-pay | Admitting: Nutrition

## 2013-01-05 NOTE — Progress Notes (Signed)
Patient is a 42 year old female diagnosed with gestational trophoblastic disease. She is a patient of Dr. Darrold Span.  Past medical history includes morbid obesity, diabetes, hypertension, and iron deficiency anemia.  Medications include ferrous sulfate, Glucophage, and MiraLax.  Labs include glucose of 111 and albumin 3.1 on March 3.  Height: 5 feet 10 inches. Weight: 325.2 pounds March 3. Usual body weight: 302 pounds documented August 2013. BMI: 46.66.  Patient reports she does not understand why she is gaining weight. She states she has increased her activity and is participating in the twice weekly [employer] program. She also is trying to swim 30 minutes 3 times a week. She states that some days she only consumes one or 2 meals. She has been educated in the past by [facility] who has educated her on diet and exercise for weight loss and glycemic control. Patient states she no longer has the information provided by RD. M.D. states patient is not following diet appropriately secondary to dietary recall of patient consuming cheeseburgers and fries.  Nutrition diagnosis: Overweight/obesity related to patient not ready for diet changes as evidenced by BMI of 46.6 and approximately 25 pound weight gain.  Intervention: I have reinforced strategies for consuming regular meals while controlling total carbohydrate and total calories. I have recommended patient document meal and snack intake and has suggested she may want to try [email or web address] to help her track her oral intake. I have encouraged her to limit her caloric intake to 1400-1500 calories daily. I have provided encouragement and support for patient to continue exercise and increasing total exercise time to a minimum of 150 minutes per week. Patient verbalizes willingness to make exercise and diet changes.  Monitoring, evaluation, goals: Patient will tolerate calorie and  carbohydrate-controlled diet to promote safe weight loss.   Next visit: There is no followup scheduled.

## 2013-05-10 ENCOUNTER — Encounter: Payer: Self-pay | Admitting: Family Medicine

## 2013-05-10 ENCOUNTER — Ambulatory Visit (INDEPENDENT_AMBULATORY_CARE_PROVIDER_SITE_OTHER): Payer: Self-pay | Admitting: Family Medicine

## 2013-05-10 VITALS — BP 148/92 | HR 75 | Temp 98.6°F | Ht 70.0 in | Wt 323.6 lb

## 2013-05-10 DIAGNOSIS — G4733 Obstructive sleep apnea (adult) (pediatric): Secondary | ICD-10-CM

## 2013-05-10 DIAGNOSIS — E785 Hyperlipidemia, unspecified: Secondary | ICD-10-CM

## 2013-05-10 DIAGNOSIS — M6530 Trigger finger, unspecified finger: Secondary | ICD-10-CM

## 2013-05-10 DIAGNOSIS — M25562 Pain in left knee: Secondary | ICD-10-CM

## 2013-05-10 DIAGNOSIS — O019 Hydatidiform mole, unspecified: Secondary | ICD-10-CM

## 2013-05-10 DIAGNOSIS — I10 Essential (primary) hypertension: Secondary | ICD-10-CM

## 2013-05-10 DIAGNOSIS — M25569 Pain in unspecified knee: Secondary | ICD-10-CM

## 2013-05-10 DIAGNOSIS — E1159 Type 2 diabetes mellitus with other circulatory complications: Secondary | ICD-10-CM

## 2013-05-10 LAB — MICROALBUMIN / CREATININE URINE RATIO
Creatinine,U: 172.7 mg/dL
Microalb Creat Ratio: 0.3 mg/g (ref 0.0–30.0)
Microalb, Ur: 0.6 mg/dL (ref 0.0–1.9)

## 2013-05-10 LAB — HEMOGLOBIN A1C: Hgb A1c MFr Bld: 6.9 % — ABNORMAL HIGH (ref 4.6–6.5)

## 2013-05-10 LAB — CBC WITH DIFFERENTIAL/PLATELET
Basophils Relative: 0.4 % (ref 0.0–3.0)
Eosinophils Absolute: 0.1 10*3/uL (ref 0.0–0.7)
Eosinophils Relative: 1.4 % (ref 0.0–5.0)
Lymphocytes Relative: 35.4 % (ref 12.0–46.0)
MCV: 72.6 fl — ABNORMAL LOW (ref 78.0–100.0)
Monocytes Absolute: 0.4 10*3/uL (ref 0.1–1.0)
Neutrophils Relative %: 56.2 % (ref 43.0–77.0)
Platelets: 195 10*3/uL (ref 150.0–400.0)
RBC: 5.24 Mil/uL — ABNORMAL HIGH (ref 3.87–5.11)
WBC: 5.8 10*3/uL (ref 4.5–10.5)

## 2013-05-10 LAB — HEPATIC FUNCTION PANEL
ALT: 21 U/L (ref 0–35)
Alkaline Phosphatase: 42 U/L (ref 39–117)
Bilirubin, Direct: 0.1 mg/dL (ref 0.0–0.3)
Total Protein: 8.2 g/dL (ref 6.0–8.3)

## 2013-05-10 LAB — POCT URINALYSIS DIPSTICK
Leukocytes, UA: NEGATIVE
Nitrite, UA: NEGATIVE
Protein, UA: NEGATIVE
pH, UA: 6

## 2013-05-10 LAB — BASIC METABOLIC PANEL
BUN: 10 mg/dL (ref 6–23)
Calcium: 9.1 mg/dL (ref 8.4–10.5)
Creatinine, Ser: 0.9 mg/dL (ref 0.4–1.2)
GFR: 92.24 mL/min (ref >60.00)

## 2013-05-10 LAB — LIPID PANEL: Total CHOL/HDL Ratio: 3

## 2013-05-10 MED ORDER — LISINOPRIL 20 MG PO TABS: 20.0000 mg | ORAL_TABLET | Freq: Every day | ORAL | Status: DC

## 2013-05-10 MED ORDER — CARVEDILOL 6.25 MG PO TABS: 6.2500 mg | ORAL_TABLET | Freq: Two times a day (BID) | ORAL | Status: DC

## 2013-05-10 NOTE — Assessment & Plan Note (Signed)
Elevated today Lisinopril inc to 20 mg  rto 2-3 weeks

## 2013-05-10 NOTE — Assessment & Plan Note (Signed)
Discussed diet and exercise with pt 

## 2013-05-10 NOTE — Patient Instructions (Addendum)
Diabetes and Standards of Medical Care  Diabetes is complicated. You may find that your diabetes team includes a dietitian, nurse, diabetes educator, eye doctor, and more. To help everyone know what is going on and to help you get the care you deserve, the following schedule of care was developed to help keep you on track. Below are the tests, exams, vaccines, medicines, education, and plans you will need. A1c test  Performed at least 2 times a year if you are meeting treatment goals.  Performed 4 times a year if therapy has changed or if you are not meeting treatment goals. Blood pressure test  Performed at every routine medical visit. The goal is less than 120/80 mmHg. Dental exam  Follow up with the dentist regularly. Eye exam  Diagnosed with type 1 diabetes as a child: Get an exam upon reaching the age of 10 years or older and having had diabetes for 3 5 years. Yearly eye exams are recommended after that initial eye exam.  Diagnosed with type 1 diabetes as an adult: Get an exam within 5 years of diagnosis and then yearly.  Diagnosed with type 2 diabetes: Get an exam as soon as possible after the diagnosis and then yearly. Foot care exam  Visual foot exams are performed at every routine medical visit. The exams check for cuts, injuries, or other problems with the feet.  A comprehensive foot exam should be done yearly. This includes visual inspection as well as assessing foot pulses and testing for loss of sensation. Kidney function test (urine microalbumin)  Performed once a year.  Type 1 diabetes: The first test is performed 5 years after diagnosis.  Type 2 diabetes: The first test is performed at the time of diagnosis.  A serum creatinine and estimated glomerular filtration rate (eGFR) test is done once a year to tell the level of chronic kidney disease (CKD), if present. Lipid profile (Cholesterol, HDL, LDL, Triglycerides)  Performed every 5 years for most people.  The  goal for LDL is less than 100 mg/dl. If at high risk, the goal is less than 70 mg/dl.  The goal for HDL is 40 mg/dl 50 mg/dl for men and 50 mg/dl 60 mg/dl for women. An HDL cholesterol of 60 mg/dL or higher gives some protection against heart disease.  The goal for triglycerides is less than 150 mg/dl. Influenza vaccine, pneumococcal vaccine, and hepatitis B vaccine  The influenza vaccine is recommended yearly.  The pneumococcal vaccine is generally given once in a lifetime. However, there are some instances when another vaccination is recommended. Check with your caregiver.  The hepatitis B vaccine is also recommended for adults with diabetes. Diabetes self-management education  Recommended at diagnosis and ongoing as needed. Treatment plan  Reviewed at every medical visit. Document Released: 08/10/2009 Document Revised: 09/29/2012 Document Reviewed: 04/15/2011 ExitCare Patient Information 2014 [employer].  

## 2013-05-10 NOTE — Assessment & Plan Note (Signed)
Check labs 

## 2013-05-10 NOTE — Assessment & Plan Note (Addendum)
Disability papers filled out Per onc

## 2013-05-10 NOTE — Progress Notes (Signed)
  Subjective:    Patient ID: [name], female    DOB: 03-Nov-1970, 42 y.o.   MRN: [id number]  HPI HYPERTENSION Disease Monitoring Blood pressure range-running high-- 160/100 Chest pain- no      Dyspnea- no Medications Compliance- good Lightheadedness- no   Edema- no   DIABETES Disease Monitoring Blood Sugar ranges-140s  Polyuria- no New Visual problems- no Medications Compliance- good Hypoglycemic symptoms- no   HYPERLIPIDEMIA Disease Monitoring See symptoms for Hypertension Medications Compliance- good RUQ pain- no  Muscle aches- no  Pt also c/o sleep apnea machine not working ---she is waking up and eating ---she sees cardiology for her bp She is also c/o pain in hands and fingers getting stuck when bending them.  ROS See HPI above   PMH Smoking Status noted     [name] As above    Objective:   Physical Exam  BP 148/92  Pulse 75  Temp(Src) 98.6 F (37 C) (Oral)  Ht 5\' 10"  (1.778 m)  Wt 323 lb 9.6 oz (146.784 kg)  BMI 46.43 kg/m2  SpO2 96% General appearance: alert, cooperative, appears stated age and no distress Throat: lips, mucosa, and tongue normal; teeth and gums normal Neck: no adenopathy, no carotid bruit, no JVD, supple, symmetrical, trachea midline and thyroid not enlarged, symmetric, no tenderness/mass/nodules Lungs: clear to auscultation bilaterally Heart: S1, S2 normal Extremities: extremities normal, atraumatic, no cyanosis or edema Neurologic: Grossly normal Sensory exam of the foot is not completely normal, tested with the monofilament.  Pt unable to feel monofilament on big toe b/l and ball of L foot.   Good pulses, no lesions or ulcers, good peripheral pulses.       Assessment & Plan:

## 2013-05-12 ENCOUNTER — Other Ambulatory Visit: Payer: Self-pay

## 2013-05-12 DIAGNOSIS — I10 Essential (primary) hypertension: Secondary | ICD-10-CM

## 2013-05-12 DIAGNOSIS — E119 Type 2 diabetes mellitus without complications: Secondary | ICD-10-CM

## 2013-05-12 MED ORDER — ATORVASTATIN CALCIUM 10 MG PO TABS: 10.0000 mg | ORAL_TABLET | Freq: Every day | ORAL | Status: DC

## 2013-05-12 MED ORDER — METFORMIN HCL ER 500 MG PO TB24: ORAL_TABLET | ORAL | Status: DC

## 2013-06-20 ENCOUNTER — Ambulatory Visit: Payer: Self-pay | Admitting: Pulmonary Disease

## 2013-08-08 ENCOUNTER — Other Ambulatory Visit: Payer: Self-pay

## 2013-08-08 DIAGNOSIS — Z1231 Encounter for screening mammogram for malignant neoplasm of breast: Secondary | ICD-10-CM

## 2013-08-09 ENCOUNTER — Telehealth: Payer: Self-pay | Admitting: Oncology

## 2013-08-09 NOTE — Telephone Encounter (Signed)
pt came in to r/s appt...Dr. Cleophas Dunker did not have available schedl in March 2015 pt wanted sooner done

## 2013-08-18 ENCOUNTER — Ambulatory Visit
Admission: RE | Admit: 2013-08-18 | Discharge: 2013-08-18 | Disposition: A | Discharge status: Home / Self Care | Payer: Self-pay | Source: Ambulatory Visit

## 2013-08-18 DIAGNOSIS — Z1231 Encounter for screening mammogram for malignant neoplasm of breast: Secondary | ICD-10-CM

## 2013-08-29 ENCOUNTER — Emergency Department (HOSPITAL_COMMUNITY)
Admission: EM | Admit: 2013-08-29 | Discharge: 2013-08-29 | Disposition: A | Discharge status: Home / Self Care | Payer: Medicare Other | Attending: Emergency Medicine | Admitting: Emergency Medicine

## 2013-08-29 ENCOUNTER — Emergency Department (HOSPITAL_COMMUNITY): Payer: Medicare Other

## 2013-08-29 ENCOUNTER — Encounter (HOSPITAL_COMMUNITY): Payer: Self-pay | Admitting: Emergency Medicine

## 2013-08-29 DIAGNOSIS — S93336A Other dislocation of unspecified foot, initial encounter: Secondary | ICD-10-CM | POA: Diagnosis not present

## 2013-08-29 DIAGNOSIS — S93105A Unspecified dislocation of left toe(s), initial encounter: Secondary | ICD-10-CM

## 2013-08-29 DIAGNOSIS — M79609 Pain in unspecified limb: Secondary | ICD-10-CM | POA: Diagnosis not present

## 2013-08-29 DIAGNOSIS — S8990XA Unspecified injury of unspecified lower leg, initial encounter: Secondary | ICD-10-CM | POA: Diagnosis not present

## 2013-08-29 DIAGNOSIS — Z79899 Other long term (current) drug therapy: Secondary | ICD-10-CM | POA: Diagnosis not present

## 2013-08-29 DIAGNOSIS — Y9389 Activity, other specified: Secondary | ICD-10-CM | POA: Insufficient documentation

## 2013-08-29 DIAGNOSIS — Y92009 Unspecified place in unspecified non-institutional (private) residence as the place of occurrence of the external cause: Secondary | ICD-10-CM | POA: Insufficient documentation

## 2013-08-29 DIAGNOSIS — E119 Type 2 diabetes mellitus without complications: Secondary | ICD-10-CM | POA: Diagnosis not present

## 2013-08-29 DIAGNOSIS — S93119A Dislocation of interphalangeal joint of unspecified toe(s), initial encounter: Secondary | ICD-10-CM | POA: Diagnosis not present

## 2013-08-29 DIAGNOSIS — W2203XA Walked into furniture, initial encounter: Secondary | ICD-10-CM | POA: Insufficient documentation

## 2013-08-29 NOTE — ED Provider Notes (Signed)
CSN: [id number]     Arrival date & time 08/29/13  0706 History   First MD Initiated Contact with Patient 08/29/13 [phone]     Chief Complaint  Patient presents with  . Toe Injury   (Consider location/radiation/quality/duration/timing/severity/associated sxs/prior Treatment) Patient is a 42 y.o. female presenting with foot injury. The history is provided by the patient.  Foot Injury Location:  Toe Time since incident:  4 hours Injury: yes   Mechanism of injury comment:  Walking to the bathroom last night and hit toe on the bed post.  it initially was deformed and pt popped it back in Toe location:  L fourth toe Pain details:    Quality:  Aching and throbbing   Radiates to:  Does not radiate   Severity:  Moderate   Onset quality:  Sudden   Timing:  Constant   Progression:  Unchanged Chronicity:  New Dislocation: yes   Prior injury to area:  No Relieved by: was able to pop the toe back in which improved the pain. Worsened by:  Bearing weight Ineffective treatments:  None tried Associated symptoms: no numbness, no swelling and no tingling   Risk factors: no frequent fractures     Past Medical History  Diagnosis Date  . Hypertension     during pregnancy  . Molar pregnancy   . Diabetes mellitus   . Gestational trophoblastic disease    Past Surgical History  Procedure Laterality Date  . Cesarean section      x2  . Right breast biopsy      x2  . Dilation and curettage of uterus     Family History  Problem Relation Age of Onset  . Hypertension Mother   . Diabetes Paternal Grandfather   . Stroke      aunts and uncles on mother side  . Obesity    . Hypertension Father   . Hypertension Paternal Grandfather   . Hypertension Paternal Grandmother    History  Substance Use Topics  . Smoking status: Never Smoker   . Smokeless tobacco: Not on file  . Alcohol Use: No   OB History   Grav Para Term Preterm Abortions TAB SAB Ect Mult Living                 Review of  Systems  All other systems reviewed and are negative.    Allergies  Sulfonamide derivatives and Sulfur  Home Medications   Current Outpatient Rx  Name  Route  Sig  Dispense  Refill  . atorvastatin (LIPITOR) 10 MG tablet   Oral   Take 1 tablet (10 mg total) by mouth daily.   90 tablet   1   . carvedilol (COREG) 6.25 MG tablet   Oral   Take 1 tablet (6.25 mg total) by mouth 2 (two) times daily with a meal. Prescribed by Lavetta Nielsen   60 tablet   5   . ferrous sulfate 325 (65 FE) MG tablet   Oral   Take 325 mg by mouth daily with breakfast.         . glucose blood (ONE TOUCH ULTRA TEST) test strip      Check blood sugar two times a day   180 each   3   . lisinopril (PRINIVIL,ZESTRIL) 20 MG tablet   Oral   Take 1 tablet (20 mg total) by mouth daily.   90 tablet   3   . medroxyPROGESTERone (DEPO-PROVERA) 150 MG/ML injection   Intramuscular  Inject 150 mg into the muscle every 3 (three) months. Taken as adjunct to cancer therapy for 1 year past completing chemo.          . metFORMIN (GLUCOPHAGE-XR) 500 MG 24 hr tablet      2 tablets by mouth daily   180 tablet   1     New directions.   Letta Pate DELICA LANCETS MISC   Does not apply   1 application by Does not apply route 2 (two) times daily.   100 each   1   . Polyethylene Glycol 3350 (MIRALAX PO)   Oral   Take 1-2 Packages by mouth 2 (two) times daily. Currently less constipated and taking every 2 days as needed.          There were no vitals taken for this visit. Physical Exam  Nursing note and vitals reviewed. Constitutional: She is oriented to person, place, and time. She appears well-developed and well-nourished. No distress.  HENT:  Head: Normocephalic and atraumatic.  Eyes: EOM are normal. Pupils are equal, round, and reactive to light.  Cardiovascular: Normal rate.   Pulmonary/Chest: Effort normal.  Musculoskeletal:       Feet:  Neurological: She is alert and oriented to person,  place, and time.  Skin: Skin is warm and dry. No rash noted. No erythema.  Psychiatric: She has a normal mood and affect. Her behavior is normal.    ED Course  Procedures (including critical care time) Labs Review Labs Reviewed - No data to display Imaging Review Dg Foot Complete Left  08/29/2013   CLINICAL DATA:  Struck left foot on bed this morning, pain at 3rd through 5th toes, history diabetes  EXAM: LEFT FOOT - COMPLETE 3+ VIEW  COMPARISON:  None  FINDINGS: Osseous mineralization normal.  Joint spaces preserved.  No fracture, dislocation, or bone destruction.  IMPRESSION: Normal exam.   Electronically Signed   By: Ulyses Southward M.D.   On: 08/29/2013 08:01    EKG Interpretation   None       MDM   1. Toe dislocation, left, initial encounter     Pt with toe injury without other exam findings.  No signs of dislocation at this time.  Plain films pending.  8:13 AM Plain films neg and pt d/ced home with supportive care, tylenol/ibuprofen.  Gwyneth Sprout, MD 08/29/13 [phone]

## 2013-08-29 NOTE — ED Notes (Signed)
Pt states that she stumped her toe on her bed railing during the middle of the night and heard a crunch when she moved it back into place.

## 2013-09-01 ENCOUNTER — Encounter: Payer: Self-pay | Admitting: Family Medicine

## 2013-09-13 ENCOUNTER — Ambulatory Visit: Payer: Medicare Other

## 2013-09-20 ENCOUNTER — Ambulatory Visit: Payer: Medicare Other

## 2013-09-27 ENCOUNTER — Ambulatory Visit: Payer: Medicare Other

## 2013-09-29 ENCOUNTER — Encounter: Payer: Medicare Other | Attending: Cardiology

## 2013-09-29 VITALS — Ht 70.0 in | Wt 326.6 lb

## 2013-09-29 DIAGNOSIS — Z713 Dietary counseling and surveillance: Secondary | ICD-10-CM | POA: Diagnosis not present

## 2013-09-29 DIAGNOSIS — E1159 Type 2 diabetes mellitus with other circulatory complications: Secondary | ICD-10-CM | POA: Diagnosis not present

## 2013-10-03 NOTE — Progress Notes (Signed)
Patient was seen on 09/29/13 for the first of a series of three diabetes self-management courses at [facility].  Current HbA1c: 6.0%  The following learning objectives were met by the patient during this class:  Describe diabetes  State some common risk factors for diabetes  Defines the role of glucose and insulin  Identifies type of diabetes and pathophysiology  Describe the relationship between diabetes and cardiovascular risk  State the members of the Healthcare Team  States the rationale for glucose monitoring  State when to test glucose  State their individual Target Range  State the importance of logging glucose readings  Describe how to interpret glucose readings  Identifies A1C target  Explain the correlation between A1c and eAG values  State symptoms and treatment of high blood glucose  State symptoms and treatment of low blood glucose  Explain proper technique for glucose testing  Identifies proper sharps disposal  Handouts given during class include:  Living Well with Diabetes book  Carb Counting and Meal Planning book  Meal Plan Card  Carbohydrate guide  Meal planning worksheet  Low Sodium Flavoring Tips  The diabetes portion plate  Low Carbohydrate Snack Suggestions  A1c to eAG Conversion Chart  Diabetes Medications  Stress Management  Diabetes Recommended Care Schedule  Diabetes Success Plan  Core Class Satisfaction Survey  Your patient has identified their diabetes care support plan as:  [facility]  Staff  Follow-Up Plan:  Attend core 2

## 2013-10-06 DIAGNOSIS — E1159 Type 2 diabetes mellitus with other circulatory complications: Secondary | ICD-10-CM

## 2013-10-06 NOTE — Progress Notes (Signed)
Patient was seen on 10/06/13 for the second of a series of three diabetes self-management courses at the [facility]. The following learning objectives were met by the patient during this class:   Describe the role of different macronutrients on glucose  Explain how carbohydrates affect blood glucose  State what foods contain the most carbohydrates  Demonstrate carbohydrate counting  Demonstrate how to read Nutrition Facts food label  Describe effects of various fats on heart health  Describe the importance of good nutrition for health and healthy eating strategies  Describe techniques for managing your shopping, cooking and meal planning  List strategies to follow meal plan when dining out  Describe the effects of alcohol on glucose and how to use it safely  Goals:  Follow Diabetes Meal Plan as instructed  Eat 3 meals and 2 snacks, every 3-5 hrs  Aim for 3 Carb Choices per meal (45 grams) +/- 1 either way  Aim for 0-2 Carbs per snack if hungry  Add lean protein foods to meals/snacks  Monitor glucose levels as instructed by your doctor   Follow-Up Plan:  Attend Core 3  Work towards following your personal food plan.

## 2013-10-11 ENCOUNTER — Other Ambulatory Visit: Payer: Self-pay | Admitting: Oncology

## 2013-10-11 DIAGNOSIS — O019 Hydatidiform mole, unspecified: Secondary | ICD-10-CM

## 2013-10-11 DIAGNOSIS — D509 Iron deficiency anemia, unspecified: Secondary | ICD-10-CM

## 2013-10-12 ENCOUNTER — Telehealth: Payer: Self-pay | Admitting: Oncology

## 2013-10-12 ENCOUNTER — Encounter: Payer: Self-pay | Admitting: Oncology

## 2013-10-12 ENCOUNTER — Ambulatory Visit (HOSPITAL_BASED_OUTPATIENT_CLINIC_OR_DEPARTMENT_OTHER): Payer: Medicare Other | Admitting: Oncology

## 2013-10-12 ENCOUNTER — Other Ambulatory Visit (HOSPITAL_COMMUNITY): Payer: Self-pay | Admitting: Cardiology

## 2013-10-12 ENCOUNTER — Other Ambulatory Visit (HOSPITAL_BASED_OUTPATIENT_CLINIC_OR_DEPARTMENT_OTHER): Payer: Medicare Other

## 2013-10-12 VITALS — BP 109/76 | HR 78 | Temp 98.7°F | Resp 18 | Ht 71.0 in | Wt 321.5 lb

## 2013-10-12 DIAGNOSIS — D392 Neoplasm of uncertain behavior of placenta: Secondary | ICD-10-CM

## 2013-10-12 DIAGNOSIS — Z23 Encounter for immunization: Secondary | ICD-10-CM

## 2013-10-12 DIAGNOSIS — D509 Iron deficiency anemia, unspecified: Secondary | ICD-10-CM

## 2013-10-12 DIAGNOSIS — O019 Hydatidiform mole, unspecified: Secondary | ICD-10-CM | POA: Diagnosis not present

## 2013-10-12 DIAGNOSIS — G729 Myopathy, unspecified: Secondary | ICD-10-CM

## 2013-10-12 LAB — COMPREHENSIVE METABOLIC PANEL (CC13)
Alkaline Phosphatase: 46 U/L (ref 40–150)
BUN: 11.3 mg/dL (ref 7.0–26.0)
Glucose: 117 mg/dl (ref 70–140)
Sodium: 140 mEq/L (ref 136–145)
Total Bilirubin: 0.52 mg/dL (ref 0.20–1.20)
Total Protein: 7.6 g/dL (ref 6.4–8.3)

## 2013-10-12 LAB — CBC WITH DIFFERENTIAL/PLATELET
BASO%: 0.6 % (ref 0.0–2.0)
Basophils Absolute: 0 10*3/uL (ref 0.0–0.1)
Eosinophils Absolute: 0.1 10*3/uL (ref 0.0–0.5)
HGB: 12 g/dL (ref 11.6–15.9)
LYMPH%: 33.5 % (ref 14.0–49.7)
MCHC: 31.7 g/dL (ref 31.5–36.0)
MCV: 72.4 fL — ABNORMAL LOW (ref 79.5–101.0)
MONO%: 8.7 % (ref 0.0–14.0)
NEUT#: 3.5 10*3/uL (ref 1.5–6.5)
Platelets: 206 10*3/uL (ref 145–400)
RBC: 5.24 10*6/uL (ref 3.70–5.45)

## 2013-10-12 LAB — IRON AND TIBC CHCC
%SAT: 23 % (ref 21–57)
Iron: 64 ug/dL (ref 41–142)
TIBC: 283 ug/dL (ref 236–444)
UIBC: 218 ug/dL (ref 120–384)

## 2013-10-12 LAB — FERRITIN CHCC: Ferritin: 113 ng/ml (ref 9–269)

## 2013-10-12 MED ORDER — INFLUENZA VAC SPLIT QUAD 0.5 ML IM SUSP
0.5000 mL | INTRAMUSCULAR | Status: AC
  Administered 2013-10-12: 0.5 mL via INTRAMUSCULAR
  Filled 2013-10-12: qty 0.5

## 2013-10-12 NOTE — Patient Instructions (Signed)
Flu vaccine done today.   You should see Dr Nelly Rout this spring.

## 2013-10-12 NOTE — Progress Notes (Signed)
OFFICE PROGRESS NOTE   10/12/2013   Physicians: [name], [name], [name], ([name], [employer])  INTERVAL HISTORY:  Patient is seen, alone for visit, in follow up of history of gestational trophoblastic disease and iron deficiency anemia.  She saw Dr Nelly Rout last in Dec 2013 with year follow up recommended; as that appointment is not yet scheduled, and as she is seeing me today, we will set up next gyn oncology appointment in 3-4 months.  Primary care is by Dr Laury Axon, whom she saw in July. Cardiology care has changed to Dr Donia Guiles, whom she sees later today. She has been doing water exercises 3x weekly for the past month at [facility], is to see diabetic nutritionist tomorrow and is for sleep study pending. Weight is still over 300 lbs and she is on oral hypoglycemic. I believe that she has diagnosis of cardiomyopathy, however the initial workup was thru [facility] and is not in this EMR. Patient has had no abdominal or pelvic pain, breathing seems at baseline, no bleeding. 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 She complains of arthritis symptoms especially knees, is aware that this would benefit from weight loss. She sleeps poorly, thinks Dr Shana Chute is considering CPAP.   ONCOLOGIC HISTORY History is of presumed pregnancy in Jan. 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 She had rising BHCG after the D&C which did not improve significantly with initial methotrexate. CT chest/abd/pelvis 03-20-11 had small pulmonary nodules (at least 4 in left and at least 3 on right) as well as large intrauterine mass. She was seen by Dr.Brewster and received EMA-CO x 6 cycles thru August 2012. 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 B HCGs have  remained <2.0. Course was complicated by DVT at site of initial RUE PICC, 3 months of coumadin completed in midOct 2012. Repeat CT chest 09-30-2011 showed only stable 3 mm RUL nodule. Recommendation for 18 month surveillance was based on review by Dr Ronita Hipps.Last BHCG was <2.0 on 12-23-12.     [name] as above, also: No recent infectious illness. No cough or chest pain. Eats when she is awake at night. Remainder of 10 point [name] negative.  Objective:  Vital signs in last 24 hours:  BP 109/76  Pulse 78  Temp(Src) 98.7 F (37.1 C) (Oral)  Resp 18  Ht 5\' 11"  (1.803 m)  Wt 321 lb 8 oz (145.831 kg)  BMI 44.86 kg/m2  SpO2 99% Weight is down 4 lbs from March 2014. Alert, oriented and appropriate. Ambulatory without assistance.   HEENT:PERRL, sclerae not icteric. Oral mucosa moist without lesions, posterior pharynx clear.  Neck supple. No JVD.  Lymphatics:no cervical,suraclavicular, axillary or inguinal adenopathy Resp: clear to auscultation bilaterally and normal percussion bilaterally Cardio: regular rate and rhythm. No gallop. GI: obese, soft, nontender, not distended, no mass or organomegaly. Normally active bowel sounds Musculoskeletal/ Extremities: without pitting edema, cords, tenderness Neuro:  peripheral neuropathy, otherwise nonfocal Skin without rash, ecchymosis, petechiae Breasts: without dominant mass, skin or nipple findings. Axillae benign.   Lab Results:  Results for orders placed in visit on 10/12/13  CBC WITH DIFFERENTIAL      Result Value Range   WBC 6.3  3.9 - 10.3 10e3/uL   NEUT#  3.5  1.5 - 6.5 10e3/uL   HGB 12.0  11.6 - 15.9 g/dL   HCT 09.8  11.9 - 14.7 %   Platelets 206  145 - 400 10e3/uL   MCV 72.4 (*) 79.5 - 101.0 fL   MCH 22.9 (*) 25.1 - 34.0 pg   MCHC 31.7  31.5 - 36.0 g/dL   RBC 8.29  5.62 - 1.30 10e6/uL   RDW 16.3 (*) 11.2 - 14.5 %   lymph# 2.1  0.9 - 3.3 10e3/uL   MONO# 0.5  0.1 - 0.9 10e3/uL   Eosinophils Absolute 0.1   0.0 - 0.5 10e3/uL   Basophils Absolute 0.0  0.0 - 0.1 10e3/uL   NEUT% 56.0  38.4 - 76.8 %   LYMPH% 33.5  14.0 - 49.7 %   MONO% 8.7  0.0 - 14.0 %   EOS% 1.2  0.0 - 7.0 %   BASO% 0.6  0.0 - 2.0 %  COMPREHENSIVE METABOLIC PANEL (CC13)      Result Value Range   Sodium 140  136 - 145 mEq/L   Potassium 3.8  3.5 - 5.1 mEq/L   Chloride 106  98 - 109 mEq/L   CO2 25  22 - 29 mEq/L   Glucose 117  70 - 140 mg/dl   BUN 86.5  7.0 - 78.4 mg/dL   Creatinine 0.8  0.6 - 1.1 mg/dL   Total Bilirubin 6.96  0.20 - 1.20 mg/dL   Alkaline Phosphatase 46  40 - 150 U/L   AST 19  5 - 34 U/L   ALT 21  0 - 55 U/L   Total Protein 7.6  6.4 - 8.3 g/dL   Albumin 3.4 (*) 3.5 - 5.0 g/dL   Calcium 9.4  8.4 - 29.5 mg/dL   Anion Gap 9  3 - 11 mEq/L   available after visit, serum iron 64, % sat 23 and ferritin 113.  Studies/Results: DIGITAL SCREENING BILATERAL MAMMOGRAM WITH CAD  [facility]  Digital breast tomosynthesis images are acquired in two projections.  These images are reviewed in combination with the digital mammogram,  confirming the findings below.  COMPARISON: Previous exam(s).  ACR Breast Density Category c: The breasts are heterogeneously  dense, which may obscure small masses.  FINDINGS:  There are no findings suspicious for malignancy. Images were  processed with CAD.  IMPRESSION:  No mammographic evidence of malignancy. A result letter of this  screening mammogram will be mailed directly to the patient.  RECOMMENDATION:  Screening mammogram in one year. (Code:SM-B-01Y)  BI-RADS CATEGORY 2: Benign Finding(s  Report discussed and importance of 3D mammography due to dense breast tissue discussed.   Medications: I have reviewed the patient's current medications. Will let her know to stop oral iron after she completes present supply. Flu vaccine given today.  DISCUSSION: we have discussed need for significant weight loss. I have encouraged her with  the water exercise and have discussed diet concerns.   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 She should see Dr Nelly Rout in ~ 3-4 months and I will see her in a year with labs, in follow up of previous chemotherapy. 2.morbid obesity: recently began water exercisese program, needs to address diet. 3.diabetes: as #2  4.HTN and reported cardiomyopathy: care recently transferred to Dr Shana Chute 5.iron deficiency anemia improved. Will DC oral iron as above. 6.mammograms done in Oct 7.flu vaccine given today  Patient is  in agreement with plan.   LIVESAY,Kathy P, MD   10/12/2013, 9:00 AM

## 2013-10-13 DIAGNOSIS — E1159 Type 2 diabetes mellitus with other circulatory complications: Secondary | ICD-10-CM

## 2013-10-13 NOTE — Progress Notes (Signed)
Patient was seen on 10/13/13 for the third of a series of three diabetes self-management courses at the [facility]. The following learning objectives were met by the patient during this class:    State the amount of activity recommended for healthy living   Describe activities suitable for individual needs   Identify ways to regularly incorporate activity into daily life   Identify barriers to activity and ways to over come these barriers  Identify diabetes medications being personally used and their primary action for lowering glucose and possible side effects   Describe role of stress on blood glucose and develop strategies to address psychosocial issues   Identify diabetes complications and ways to prevent them  Explain how to manage diabetes during illness   Evaluate success in meeting personal goal   Establish 2-3 goals that they will plan to diligently work on until they return for the  98-month follow-up visit  Goals:  Follow Diabetes Meal Plan as instructed  Aim for 15-30 mins of physical activity daily as tolerated  Bring food record and glucose log to your follow up visit  Your patient has established the following 4 month goals in their individualized success plan:  Count carbs at most meals and snacks  Swim 2-3 X a week for 45-60 minutes  Take my diabetes medications as scheduled  Test my glucose at least 2 times 7 days a week  Your patient has identified these potential barriers to change:  Eating in the middle of the night because I don't sleep at night.  Your patient has identified their diabetes self-care support plan as  [facility] support group

## 2013-10-14 ENCOUNTER — Telehealth: Payer: Self-pay

## 2013-10-14 ENCOUNTER — Ambulatory Visit (HOSPITAL_COMMUNITY)
Admission: RE | Admit: 2013-10-14 | Discharge: 2013-10-14 | Disposition: A | Discharge status: Home / Self Care | Payer: Medicare Other | Source: Ambulatory Visit | Attending: Cardiology | Admitting: Cardiology

## 2013-10-14 DIAGNOSIS — I428 Other cardiomyopathies: Secondary | ICD-10-CM | POA: Insufficient documentation

## 2013-10-14 DIAGNOSIS — I379 Nonrheumatic pulmonary valve disorder, unspecified: Secondary | ICD-10-CM | POA: Diagnosis not present

## 2013-10-14 DIAGNOSIS — I079 Rheumatic tricuspid valve disease, unspecified: Secondary | ICD-10-CM | POA: Diagnosis not present

## 2013-10-14 DIAGNOSIS — G729 Myopathy, unspecified: Secondary | ICD-10-CM

## 2013-10-14 NOTE — Telephone Encounter (Signed)
Message copied by Lorine Bears on Fri Oct 14, 2013  2:21 PM ------      Message from: Jama Flavors P      Created: Thu Oct 13, 2013  2:45 PM       Please let her know iron studies are back in normal range. OK to stop iron when she finishes what she has on hand. ------

## 2013-10-14 NOTE — Progress Notes (Signed)
  Echocardiogram 2D Echocardiogram has been performed.  Georgian Co 10/14/2013, 9:53 AM

## 2013-10-14 NOTE — Telephone Encounter (Signed)
Left message for patient in cell phone voicemail regarding the iron study results as noted below by Dr. Darrold Span.  Pt. to call if any questions.

## 2013-12-01 ENCOUNTER — Telehealth: Payer: Self-pay | Admitting: *Deleted

## 2013-12-01 ENCOUNTER — Other Ambulatory Visit: Payer: Self-pay | Admitting: *Deleted

## 2013-12-01 MED ORDER — GLUCOSE BLOOD VI STRP
ORAL_STRIP | Status: DC
Start: 2013-12-01 — End: 2013-12-06

## 2013-12-01 NOTE — Telephone Encounter (Signed)
Patent called and stated that her pharmacy have sent request over to refill her glucose blood (ONE TOUCH ULTRA TEST) test strip but no response back from [place].   Pharmacy [facility] [place], [place]

## 2013-12-01 NOTE — Telephone Encounter (Signed)
Done. JG//CMA 

## 2013-12-06 ENCOUNTER — Other Ambulatory Visit: Payer: Self-pay

## 2013-12-06 MED ORDER — GLUCOSE BLOOD VI STRP
ORAL_STRIP | Status: DC
Start: 2013-12-06 — End: 2013-12-08

## 2013-12-07 NOTE — Telephone Encounter (Signed)
Patient called and stated that her pharmacy could not refill her glucose blood one touch ultra test strip because the diagnosis code was missing on the rx.

## 2013-12-08 ENCOUNTER — Other Ambulatory Visit: Payer: Self-pay | Admitting: *Deleted

## 2013-12-08 MED ORDER — GLUCOSE BLOOD VI STRP: ORAL_STRIP | Status: DC

## 2013-12-08 NOTE — Telephone Encounter (Signed)
Done. JG//CMA 

## 2013-12-08 NOTE — Telephone Encounter (Signed)
Janett Billow,   I have tried to correct this and add the 250.00 Dx code but get a message that it is a future order that I can't change. Please go back an look at this and add the diagnosis code to the reorder.  Thanks!

## 2013-12-27 ENCOUNTER — Other Ambulatory Visit: Payer: Self-pay | Admitting: Lab

## 2013-12-27 ENCOUNTER — Ambulatory Visit: Payer: Self-pay

## 2014-01-04 DIAGNOSIS — E119 Type 2 diabetes mellitus without complications: Secondary | ICD-10-CM | POA: Diagnosis not present

## 2014-01-04 DIAGNOSIS — R5381 Other malaise: Secondary | ICD-10-CM | POA: Diagnosis not present

## 2014-01-04 DIAGNOSIS — R5383 Other fatigue: Secondary | ICD-10-CM | POA: Diagnosis not present

## 2014-01-10 DIAGNOSIS — H35039 Hypertensive retinopathy, unspecified eye: Secondary | ICD-10-CM | POA: Diagnosis not present

## 2014-01-10 DIAGNOSIS — E1139 Type 2 diabetes mellitus with other diabetic ophthalmic complication: Secondary | ICD-10-CM | POA: Diagnosis not present

## 2014-01-10 DIAGNOSIS — E119 Type 2 diabetes mellitus without complications: Secondary | ICD-10-CM | POA: Diagnosis not present

## 2014-01-10 DIAGNOSIS — H52229 Regular astigmatism, unspecified eye: Secondary | ICD-10-CM | POA: Diagnosis not present

## 2014-02-14 ENCOUNTER — Encounter: Payer: Medicare Other | Attending: Family Medicine | Admitting: *Deleted

## 2014-02-14 ENCOUNTER — Encounter: Payer: Self-pay | Admitting: *Deleted

## 2014-02-14 ENCOUNTER — Telehealth: Payer: Self-pay | Admitting: Gynecologic Oncology

## 2014-02-14 VITALS — Ht 71.0 in | Wt 321.7 lb

## 2014-02-14 DIAGNOSIS — E119 Type 2 diabetes mellitus without complications: Secondary | ICD-10-CM | POA: Insufficient documentation

## 2014-02-14 DIAGNOSIS — Z713 Dietary counseling and surveillance: Secondary | ICD-10-CM | POA: Insufficient documentation

## 2014-02-14 DIAGNOSIS — E1159 Type 2 diabetes mellitus with other circulatory complications: Secondary | ICD-10-CM

## 2014-02-14 NOTE — Progress Notes (Signed)
Patient was seen on 02/14/14 for her 4 month follow-up as a part of the diabetes self-management courses at the [facility].   Patient self reports the following: Ziah has multiple health conditions presenting a challenge for her. She has recently started using a c-pap machine which has increased her quality of sleep. However, she continues to struggle with quantity of sleep. She spends most of her day "exhausted". During her awake times she tends to want to eat.  Meal Plan: Makaia is not counting carbohydrates. She is mindful of making better choices and portion control A1c: down from approx 9% to approx 6% Glucose: FBS this morning 105 mg/dl, avg 90-150 mg/dl Exercise: Swimming at the [facility] 3X weekly doing laps for 30 minutes.  Consider Brummel and Owens Shark as butter substitute Consider Lynnae Sandhoff reduced calorie bread 45-50 calories Try to increase your activity to 150 minutes per week Consider snacking on non carb foods Look at nutrition labels of foods Dilute fruit juice  Keep up the good work with food choices...Marland KitchenMarland KitchenMarland Kitchen Be mindful of portion sizes Food log for 24 hours to include portions and time of day. Email to me for review. [email or web address]  Willingness to participate in diabetes support group: Not at this time  Follow-Up Plan: Patient to call and schedule as needed.

## 2014-02-14 NOTE — Telephone Encounter (Signed)
Progress Notes     Follow-up Note: Gyn-Onc   [name] 43 y.o. female   CC:  Chief Complaint  Patient presents with   .  GTD       Follow up     Assessment/Plan:   This is a 43 y.o. with metastatic gestational trophoblastic neoplasia. She's completed treatment 06/2011 with normalization of her beta hCG  Given the residual lung lesion the plan is for observation for 18 months until 11/2012  Abnormal uterine bleeding: An endometrial biopsy was collected today followup with the results.  Follow up with Dr. Marko Plume in February 2014 , Dr. Charlesetta Garibaldi in 6 months  GYN oncology in December 2014        HPI: This is a 43 y.o. gravida 26, para 2  with 1 neonatal demise, last pregnancy 2-1/2 years ago, that resulted  in miscarriage. She presented with bleeding in April 2012. D and C was  performed at that time, returned with evidence of a complete molar  pregnancy. Beta HCG after the D and C was 158,467. The beta HCG's  declined and then rose to 257,443. Methotrexate was initiated; however,  plateaued at 175,721. She was then seen in evaluation on Mar 25, 2011,  and calculation of the [facility] score returned with a value of 8 which  required treatment with [facility]. She received 6 cycles of EMACO. Of  note, there was normalization of her beta HCG just prior to cycle 5.  The last HCG returned a value of less than 2. 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 Last cycle 06/2011 The treatment course was  also complicated by a clot in the PICC line.  Given the residual pulmonary nodule the residual nodule in the chest and negative HCG the plan is to continue  BHCG monthly x 18 months ( until Feb 2014) rather than usual 12 months.   In 09/2012  patient noted significant shortness of breath with ambulation.  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 Of note she has  also had vaginal spotting  Beta hCG on 09/29/2012 returned to value of 4. EMB c/w inactive endometrium.   Past Surgical History   Procedure  Date   .  Cesarean section        x2   .  Right breast biopsy     x2   .  Dilation and curettage of uterus       Past Medical History   Diagnosis  Date   .  Hypertension         during pregnancy   .  Molar pregnancy     .  Diabetes mellitus     .  Metastatic gestational trophoblastic disease       Family History   Problem  Relation  Age of Onset   .  Hypertension  Mother     .  Diabetes  Paternal Grandfather     .  Stroke           aunts and uncles on mother side   .  Obesity       .  Hypertension  Father     .  Hypertension  Paternal Grandfather     .  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   Gastro Intestinal   No nausea, vomitting, or diarrhoea. No bright red blood per rectum, no abdominal pain, change in bowel movement, or constipation.   Genito Urinary   No frequency, urgency, dysuria,  vaginal bleeding for 2 weeks  Musculo Skeletal   No myalgia, arthralgia, joint swelling or pain   Neurologic   No weakness, numbness, change in gait,   Psychology   No depression, anxiety, insomnia.    Vitals:  BP 128/74  Pulse 72  Temp 98.6 F (37 C) (Oral)  Resp 16  Ht 5\' 10"  (1.778 m)  Wt 312 lb 12.8 oz (141.885 kg)  BMI 44.88 kg/m2   Physical Exam: WD female in no acute distress Neck   Supple without any enlargements.  Lymph node survey. No cervical supraclavicular cervical or inguinal adenopathy Cardiovascular   Pulse normal rate, regularity and rhythm. S1 and S2 normal. Lungs   Clear to auscultation bilateraly, without wheezes/crackles/rhonchi. Good air movement.   Skin   No  rash/lesions/breakdown   Psychiatry   Alert and oriented to person, place, and time  Back No CVA tenderness Abdomen   Normoactive bowel sounds, abdomen soft, non-tender and obese.  Genito Urinary   Vulva/vagina: Normal external female genitalia.  No lesions.               Bladder/urethra:  No lesions or masses             Vagina: No palpable lesions blood is noted within the vaginal vault.. The cervix is small 2 cm mobile no parametrial adenopathy  Uterus: Mobile and endometrial biopsy was collected the uterus sounded to 8 cm  Extremities   No bilateral cyanosis, 1-2+ edema.

## 2014-02-14 NOTE — Patient Instructions (Signed)
Consider Brummel and [employer] Shark as butter substitute Consider Lynnae Sandhoff reduced calorie bread 45-50 calories Try to increase your activity to 150 minutes per week Consider snacking on non carb foods Dilute fruit juice  Keep up the good work with food choices...Marland KitchenMarland KitchenMarland Kitchen Be mindful of portion sizes Food log for 24 hours to include portions and time of day. Email to me for review. [email or web address]

## 2014-02-15 ENCOUNTER — Ambulatory Visit: Payer: Medicare Other | Admitting: *Deleted

## 2014-02-16 ENCOUNTER — Ambulatory Visit: Payer: Medicare Other

## 2014-02-16 ENCOUNTER — Encounter: Payer: Self-pay | Admitting: Gynecologic Oncology

## 2014-02-16 ENCOUNTER — Ambulatory Visit: Payer: Medicare Other | Attending: Gynecologic Oncology | Admitting: Gynecologic Oncology

## 2014-02-16 VITALS — BP 130/87 | HR 92 | Temp 98.6°F | Ht 70.0 in | Wt 324.4 lb

## 2014-02-16 DIAGNOSIS — O019 Hydatidiform mole, unspecified: Secondary | ICD-10-CM | POA: Insufficient documentation

## 2014-02-16 NOTE — Patient Instructions (Signed)
  Follow up with Dr. Charlesetta Garibaldi for gyn care F/U with Gyn Onc as indicated  Wt Readings from Last 3 Encounters:  02/14/14 321 lb 11.2 oz (145.922 kg)  10/12/13 321 lb 8 oz (145.831 kg)  10/03/13 326 lb 9.6 oz (148.145 kg)     Thank you very much [name] for allowing me to provide care for you today.  I appreciate your confidence in choosing our Gynecologic Oncology team.  If you have any questions about your visit today please call our office and we will get back to you as soon as possible.  Francetta Found. Hadley Soileau MD., PhD Gynecologic Oncology

## 2014-02-16 NOTE — Progress Notes (Signed)
Office Visit:  GYN ONCOLOGY  CC:  Chief Complaint  Patient presents with   .  GTD       Follow up     Assessment/Plan:   This is a 43 y.o.  with metastatic gestational trophoblastic neoplasia. She's completed treatment 06/2011 with normalization of her beta hCG  Given the residual lung lesion the observation was extended  for 18 months until 11/2012   Follow up with Dr. Charlesetta Garibaldi for gyn care F/U with Gyn Onc as indicated   Check FSH     HPI: This is a 43 y.o. gravida 31, para 2  with 1 neonatal demise, last pregnancy 2-1/2 years ago, that resulted  in miscarriage. She presented with bleeding in April 2012. D and C was  performed at that time, returned with evidence of a complete molar  pregnancy. Beta HCG after the D and C was 158,467. The beta HCG's  declined and then rose to 257,443. Methotrexate was initiated; however,  plateaued at 175,721. She was then seen in evaluation on Mar 25, 2011,  and calculation of the [facility] score returned with a value of 8 which  required treatment with [facility]. She received 6 cycles of EMACO. Of  note, there was normalization of her beta HCG just prior to cycle 5.  The last HCG returned a value of less than 2. 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 Last cycle 06/2011 The treatment course was  also complicated by a clot in the PICC line.  Given the residual pulmonary nodule the residual nodule in the chest and negative HCG the plan is to continue  BHCG monthly x 18 months ( until Feb 2014) rather than usual 12 months.   In 09/2012  patient noted significant shortness of breath with ambulation.  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 Of note she has also had vaginal spotting  Beta hCG on 09/29/2012 returned to value of 4. EMB c/w inactive endometrium.  LMP 09/2010.  Denies hot flashes   Past Surgical History    Procedure  Date   .  Cesarean section        x2   .  Right breast biopsy     x2   .  Dilation and curettage of uterus       Past Medical History   Diagnosis  Date   .  Hypertension         during pregnancy   .  Molar pregnancy     .  Diabetes mellitus     .  Metastatic gestational trophoblastic disease       Family History   Problem  Relation  Age of Onset   .  Hypertension  Mother     .  Diabetes  Paternal Grandfather     .  Stroke           aunts and uncles on mother side   .  Obesity       .  Hypertension  Father     .  Hypertension  Paternal Grandfather     .  Hypertension  Paternal Grandmother      [name]: Constitutional   Feels well reports   Cardiovascular   No chest pain, shortness of breath, or edema   Pulmonary   No cough or wheeze.   Gastro Intestinal   No nausea, vomitting, or diarrhoea.  No bright red blood per rectum, no abdominal pain, change in bowel movement, or constipation.   Genito Urinary   No frequency, urgency, dysuria, reports urge incontinence Musculo Skeletal   No myalgia, arthralgia, joint swelling or pain   Neurologic   No weakness, numbness, change in gait,  No vasomotor symptoms Psychology   No depression, anxiety, insomnia.    Vitals: BP 130/87  Pulse 92  Temp(Src) 98.6 F (37 C) (Oral)  Ht 5\' 10"  (1.778 m)  Wt 324 lb 6.4 oz (147.147 kg)  BMI 46.55 kg/m2  Physical Exam: WD female in no acute distress Neck   Supple without any enlargements.  Lymph node survey. No cervical supraclavicular cervical or inguinal adenopathy Cardiovascular   Pulse normal rate, regularity and rhythm.  Lungs   Clear to auscultation bilateraly, without wheezes/crackles/rhonchi.    Psychiatry   Alert appropriate mood and affect Back No CVA tenderness Abdomen   Normoactive bowel sounds, abdomen soft, non-tender and obese.  Genito Urinary   Vulva/vagina: Normal external female genitalia.  No lesions.               Bladder/urethra:   No lesions or masses             Vagina: No palpable lesions blood is noted within the vaginal vault.. The cervix is small 2 cm mobile no parametrial adenopathy or lesions  Uterus: Mobile Extremities   No bilateral cyanosis, 1 edema.

## 2014-02-17 LAB — FOLLICLE STIMULATING HORMONE: FSH: 2.9 m[IU]/mL

## 2014-02-23 ENCOUNTER — Telehealth: Payer: Self-pay | Admitting: *Deleted

## 2014-02-23 NOTE — Telephone Encounter (Signed)
Notified pt per NP, it does not appear that she is in menopause yet. Still ovulating so would need birth control/protection method if having intercourse. FSH 2.9 Menopause 23-116. Pt verbalized understanding no further concerns

## 2014-03-28 DIAGNOSIS — T07XXXA Unspecified multiple injuries, initial encounter: Secondary | ICD-10-CM | POA: Diagnosis not present

## 2014-03-28 DIAGNOSIS — Z882 Allergy status to sulfonamides status: Secondary | ICD-10-CM | POA: Diagnosis not present

## 2014-03-28 DIAGNOSIS — Z9889 Other specified postprocedural states: Secondary | ICD-10-CM | POA: Diagnosis not present

## 2014-03-28 DIAGNOSIS — M549 Dorsalgia, unspecified: Secondary | ICD-10-CM | POA: Diagnosis not present

## 2014-03-28 DIAGNOSIS — T1490XA Injury, unspecified, initial encounter: Secondary | ICD-10-CM | POA: Diagnosis not present

## 2014-03-28 DIAGNOSIS — Z8542 Personal history of malignant neoplasm of other parts of uterus: Secondary | ICD-10-CM | POA: Diagnosis not present

## 2014-03-28 DIAGNOSIS — Z79899 Other long term (current) drug therapy: Secondary | ICD-10-CM | POA: Diagnosis not present

## 2014-03-28 DIAGNOSIS — Z043 Encounter for examination and observation following other accident: Secondary | ICD-10-CM | POA: Diagnosis not present

## 2014-03-28 DIAGNOSIS — M545 Low back pain, unspecified: Secondary | ICD-10-CM | POA: Diagnosis not present

## 2014-03-28 DIAGNOSIS — R071 Chest pain on breathing: Secondary | ICD-10-CM | POA: Diagnosis not present

## 2014-03-28 DIAGNOSIS — I10 Essential (primary) hypertension: Secondary | ICD-10-CM | POA: Diagnosis not present

## 2014-03-28 DIAGNOSIS — D649 Anemia, unspecified: Secondary | ICD-10-CM | POA: Diagnosis not present

## 2014-03-28 DIAGNOSIS — R079 Chest pain, unspecified: Secondary | ICD-10-CM | POA: Diagnosis not present

## 2014-03-28 DIAGNOSIS — Z90710 Acquired absence of both cervix and uterus: Secondary | ICD-10-CM | POA: Diagnosis not present

## 2014-03-28 DIAGNOSIS — M542 Cervicalgia: Secondary | ICD-10-CM | POA: Diagnosis not present

## 2014-03-28 DIAGNOSIS — R52 Pain, unspecified: Secondary | ICD-10-CM | POA: Diagnosis not present

## 2014-03-30 ENCOUNTER — Ambulatory Visit: Payer: Medicare Other | Admitting: Internal Medicine

## 2014-04-04 DIAGNOSIS — Z1231 Encounter for screening mammogram for malignant neoplasm of breast: Secondary | ICD-10-CM | POA: Diagnosis not present

## 2014-04-04 DIAGNOSIS — Z124 Encounter for screening for malignant neoplasm of cervix: Secondary | ICD-10-CM | POA: Diagnosis not present

## 2014-04-04 DIAGNOSIS — M545 Low back pain, unspecified: Secondary | ICD-10-CM | POA: Diagnosis not present

## 2014-04-06 ENCOUNTER — Ambulatory Visit (INDEPENDENT_AMBULATORY_CARE_PROVIDER_SITE_OTHER): Payer: Medicare Other | Admitting: Family Medicine

## 2014-04-06 ENCOUNTER — Encounter: Payer: Self-pay | Admitting: Family Medicine

## 2014-04-06 VITALS — BP 116/74 | HR 85 | Temp 98.3°F | Wt 317.6 lb

## 2014-04-06 DIAGNOSIS — M62830 Muscle spasm of back: Secondary | ICD-10-CM

## 2014-04-06 DIAGNOSIS — M5380 Other specified dorsopathies, site unspecified: Secondary | ICD-10-CM | POA: Diagnosis not present

## 2014-04-06 MED ORDER — CARISOPRODOL 350 MG PO TABS: 350.0000 mg | ORAL_TABLET | Freq: Four times a day (QID) | ORAL | Status: DC | PRN

## 2014-04-06 MED ORDER — HYDROCODONE-ACETAMINOPHEN 5-325 MG PO TABS: ORAL_TABLET | ORAL | Status: DC

## 2014-04-06 NOTE — Progress Notes (Signed)
Pre visit review using our clinic review tool, if applicable. No additional management support is needed unless otherwise documented below in the visit note. 
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
## 2014-04-06 NOTE — Patient Instructions (Signed)
Cervical Sprain A cervical sprain is an injury in the neck in which the strong, fibrous tissues (ligaments) that connect your neck bones stretch or tear. Cervical sprains can range from mild to severe. Severe cervical sprains can cause the neck vertebrae to be unstable. This can lead to damage of the spinal cord and can result in serious nervous system problems. The amount of time it takes for a cervical sprain to get better depends on the cause and extent of the injury. Most cervical sprains heal in 1 to 3 weeks. CAUSES  Severe cervical sprains may be caused by:   Contact sport injuries (such as from football, rugby, wrestling, hockey, auto racing, gymnastics, diving, martial arts, or boxing).   Motor vehicle collisions.   Whiplash injuries. This is an injury from a sudden forward-and backward whipping movement of the head and neck.  Falls.  Mild cervical sprains may be caused by:   Being in an awkward position, such as while cradling a telephone between your ear and shoulder.   Sitting in a chair that does not offer proper support.   Working at a poorly designed computer station.   Looking up or down for long periods of time.  SYMPTOMS   Pain, soreness, stiffness, or a burning sensation in the front, back, or sides of the neck. This discomfort may develop immediately after the injury or slowly, 24 hours or more after the injury.   Pain or tenderness directly in the middle of the back of the neck.   Shoulder or upper back pain.   Limited ability to move the neck.   Headache.   Dizziness.   Weakness, numbness, or tingling in the hands or arms.   Muscle spasms.   Difficulty swallowing or chewing.   Tenderness and swelling of the neck.  DIAGNOSIS  Most of the time your health care provider can diagnose a cervical sprain by taking your history and doing a physical exam. Your health care provider will ask about previous neck injuries and any known neck  problems, such as arthritis in the neck. X-rays may be taken to find out if there are any other problems, such as with the bones of the neck. Other tests, such as a CT scan or MRI, may also be needed.  TREATMENT  Treatment depends on the severity of the cervical sprain. Mild sprains can be treated with rest, keeping the neck in place (immobilization), and pain medicines. Severe cervical sprains are immediately immobilized. Further treatment is done to help with pain, muscle spasms, and other symptoms and may include:  Medicines, such as pain relievers, numbing medicines, or muscle relaxants.   Physical therapy. This may involve stretching exercises, strengthening exercises, and posture training. Exercises and improved posture can help stabilize the neck, strengthen muscles, and help stop symptoms from returning.  HOME CARE INSTRUCTIONS   Put ice on the injured area.   Put ice in a plastic bag.   Place a towel between your skin and the bag.   Leave the ice on for 15 20 minutes, 3 4 times a day.   If your injury was severe, you may have been given a cervical collar to wear. A cervical collar is a two-piece collar designed to keep your neck from moving while it heals.  Do not remove the collar unless instructed by your health care provider.  If you have long hair, keep it outside of the collar.  Ask your health care provider before making any adjustments to your collar.   Minor adjustments may be required over time to improve comfort and reduce pressure on your chin or on the back of your head.  Ifyou are allowed to remove the collar for cleaning or bathing, follow your health care provider's instructions on how to do so safely.  Keep your collar clean by wiping it with mild soap and water and drying it completely. If the collar you have been given includes removable pads, remove them every 1 2 days and hand wash them with soap and water. Allow them to air dry. They should be completely  dry before you wear them in the collar.  If you are allowed to remove the collar for cleaning and bathing, wash and dry the skin of your neck. Check your skin for irritation or sores. If you see any, tell your health care provider.  Do not drive while wearing the collar.   Only take over-the-counter or prescription medicines for pain, discomfort, or fever as directed by your health care provider.   Keep all follow-up appointments as directed by your health care provider.   Keep all physical therapy appointments as directed by your health care provider.   Make any needed adjustments to your workstation to promote good posture.   Avoid positions and activities that make your symptoms worse.   Warm up and stretch before being active to help prevent problems.  SEEK MEDICAL CARE IF:   Your pain is not controlled with medicine.   You are unable to decrease your pain medicine over time as planned.   Your activity level is not improving as expected.  SEEK IMMEDIATE MEDICAL CARE IF:   You develop any bleeding.  You develop stomach upset.  You have signs of an allergic reaction to your medicine.   Your symptoms get worse.   You develop new, unexplained symptoms.   You have numbness, tingling, weakness, or paralysis in any part of your body.  MAKE SURE YOU:   Understand these instructions.  Will watch your condition.  Will get help right away if you are not doing well or get worse. Document Released: 08/10/2007 Document Revised: 08/03/2013 Document Reviewed: 04/20/2013 [employer] Patient Information 2014 [employer].  

## 2014-04-24 ENCOUNTER — Other Ambulatory Visit: Payer: Self-pay | Admitting: Family Medicine

## 2014-04-25 ENCOUNTER — Ambulatory Visit: Payer: Medicare Other | Admitting: Family Medicine

## 2014-04-25 ENCOUNTER — Ambulatory Visit (INDEPENDENT_AMBULATORY_CARE_PROVIDER_SITE_OTHER): Payer: Medicare Other | Admitting: Family Medicine

## 2014-04-25 ENCOUNTER — Encounter: Payer: Self-pay | Admitting: Family Medicine

## 2014-04-25 VITALS — BP 122/90 | HR 73 | Temp 98.5°F | Ht 69.5 in | Wt 315.0 lb

## 2014-04-25 DIAGNOSIS — Z0000 Encounter for general adult medical examination without abnormal findings: Secondary | ICD-10-CM | POA: Diagnosis not present

## 2014-04-25 DIAGNOSIS — N393 Stress incontinence (female) (male): Secondary | ICD-10-CM

## 2014-04-25 DIAGNOSIS — M545 Low back pain, unspecified: Secondary | ICD-10-CM

## 2014-04-25 DIAGNOSIS — Z23 Encounter for immunization: Secondary | ICD-10-CM | POA: Diagnosis not present

## 2014-04-25 DIAGNOSIS — E1149 Type 2 diabetes mellitus with other diabetic neurological complication: Secondary | ICD-10-CM | POA: Diagnosis not present

## 2014-04-25 DIAGNOSIS — M5380 Other specified dorsopathies, site unspecified: Secondary | ICD-10-CM

## 2014-04-25 DIAGNOSIS — I10 Essential (primary) hypertension: Secondary | ICD-10-CM | POA: Diagnosis not present

## 2014-04-25 DIAGNOSIS — M62830 Muscle spasm of back: Secondary | ICD-10-CM

## 2014-04-25 DIAGNOSIS — R32 Unspecified urinary incontinence: Secondary | ICD-10-CM

## 2014-04-25 LAB — BASIC METABOLIC PANEL
BUN: 12 mg/dL (ref 6–23)
CHLORIDE: 102 meq/L (ref 96–112)
CO2: 26 meq/L (ref 19–32)
Calcium: 9 mg/dL (ref 8.4–10.5)
Creatinine, Ser: 0.9 mg/dL (ref 0.4–1.2)
GFR: 91.84 mL/min (ref >60.00)
Glucose, Bld: 102 mg/dL — ABNORMAL HIGH (ref 70–99)
Potassium: 3.5 mEq/L (ref 3.5–5.1)
SODIUM: 134 meq/L — AB (ref 135–145)

## 2014-04-25 LAB — CBC WITH DIFFERENTIAL/PLATELET
BASOS PCT: 0.4 % (ref 0.0–3.0)
Basophils Absolute: 0 10*3/uL (ref 0.0–0.1)
Eosinophils Absolute: 0.1 10*3/uL (ref 0.0–0.7)
Eosinophils Relative: 1.4 % (ref 0.0–5.0)
HCT: 38.3 % (ref 36.0–46.0)
Hemoglobin: 12 g/dL (ref 12.0–15.0)
LYMPHS PCT: 39.4 % (ref 12.0–46.0)
Lymphs Abs: 2.1 10*3/uL (ref 0.7–4.0)
MCHC: 31.4 g/dL (ref 30.0–36.0)
MCV: 73 fl — ABNORMAL LOW (ref 78.0–100.0)
Monocytes Absolute: 0.4 10*3/uL (ref 0.1–1.0)
Monocytes Relative: 7.8 % (ref 3.0–12.0)
Neutro Abs: 2.7 10*3/uL (ref 1.4–7.7)
Neutrophils Relative %: 51 % (ref 43.0–77.0)
Platelets: 178 10*3/uL (ref 150.0–400.0)
RBC: 5.25 Mil/uL — AB (ref 3.87–5.11)
RDW: 15.9 % — ABNORMAL HIGH (ref 11.5–15.5)
WBC: 5.3 10*3/uL (ref 4.0–10.5)

## 2014-04-25 LAB — HEPATIC FUNCTION PANEL
ALT: 15 U/L (ref 0–35)
AST: 13 U/L (ref 0–37)
Albumin: 3.5 g/dL (ref 3.5–5.2)
Alkaline Phosphatase: 38 U/L — ABNORMAL LOW (ref 39–117)
BILIRUBIN DIRECT: 0.1 mg/dL (ref 0.0–0.3)
TOTAL PROTEIN: 7.9 g/dL (ref 6.0–8.3)
Total Bilirubin: 0.3 mg/dL (ref 0.2–1.2)

## 2014-04-25 LAB — LIPID PANEL
CHOL/HDL RATIO: 3
Cholesterol: 154 mg/dL (ref 0–200)
HDL: 57.3 mg/dL (ref >39.00)
LDL Cholesterol: 89 mg/dL (ref 0–99)
NonHDL: 96.7
TRIGLYCERIDES: 40 mg/dL (ref 0.0–149.0)
VLDL: 8 mg/dL (ref 0.0–40.0)

## 2014-04-25 LAB — HEMOGLOBIN A1C: Hgb A1c MFr Bld: 6.5 % (ref 4.6–6.5)

## 2014-04-25 MED ORDER — HYDROCODONE-ACETAMINOPHEN 5-325 MG PO TABS: ORAL_TABLET | ORAL | Status: DC

## 2014-04-25 MED ORDER — SOLIFENACIN SUCCINATE 10 MG PO TABS: 10.0000 mg | ORAL_TABLET | Freq: Every day | ORAL | Status: DC

## 2014-04-25 MED ORDER — CARISOPRODOL 350 MG PO TABS: 350.0000 mg | ORAL_TABLET | Freq: Four times a day (QID) | ORAL | Status: DC | PRN

## 2014-04-25 NOTE — Progress Notes (Signed)
Subjective:    [name] is a 43 y.o. female who presents for a welcome to Medicare exam.  Pt c/o incontinence which started in last month.    Cardiac risk factors: diabetes mellitus, hypertension and obesity (BMI >= 30 kg/m2).  Activities of Daily Living  In your present state of health, do you have any difficulty performing the following activities?:  Preparing food and eating?: No Bathing yourself: No Getting dressed: No Using the toilet:No Moving around from place to place: No In the past year have you fallen or had a near fall?:No  Current exercise habits: swimming-- 4-5 days a week   Dietary issues discussed: diabetic   Depression Screen (Note: if answer to either of the following is "Yes", then a more complete depression screening is indicated)  Q1: Over the past two weeks, have you felt down, depressed or hopeless?no Q2: Over the past two weeks, have you felt little interest or pleasure in doing things? no   The following portions of the patient's history were reviewed and updated as appropriate:  She  has a past medical history of Hypertension; Molar pregnancy; Diabetes mellitus; Gestational trophoblastic disease; Morbid obesity; Cancer; and Cardiomyopathy due to chemotherapy. She  does not have any pertinent problems on file. She  has past surgical history that includes Cesarean section; right breast biopsy; and Dilation and curettage of uterus. Her family history includes Diabetes in her paternal grandfather; Hypertension in her father, mother, paternal grandfather, and paternal grandmother; Obesity in an other family member; Stroke in an other family member. She  reports that she has never smoked. She does not have any smokeless tobacco history on file. She reports that she does not drink alcohol or use illicit drugs. She has a current medication list which includes the following prescription(s): amlodipine, carisoprodol, carvedilol, ferrous sulfate, glucose blood,  hydrocodone-acetaminophen, lisinopril, medroxyprogesterone, metformin, and onetouch delica lancets. Current Outpatient Prescriptions on File Prior to Visit  Medication Sig Dispense Refill  . amLODipine (NORVASC) 5 MG tablet Take 5 mg by mouth daily.      . carisoprodol (SOMA) 350 MG tablet Take 1 tablet (350 mg total) by mouth 4 (four) times daily as needed for muscle spasms.  30 tablet  0  . carvedilol (COREG) 6.25 MG tablet Take 6.25 mg by mouth 2 (two) times daily with a meal.      . ferrous sulfate 325 (65 FE) MG tablet Take 325 mg by mouth every other day.       Marland Kitchen glucose blood (ONE TOUCH ULTRA TEST) test strip Check blood sugar two times a day. Dx code: 250.72  180 each  12  . HYDROcodone-acetaminophen (NORCO/VICODIN) 5-325 MG per tablet 1-2 tabs po q6h prn pain  60 tablet  0  . lisinopril (PRINIVIL,ZESTRIL) 20 MG tablet Take 20 mg by mouth daily.      . medroxyPROGESTERone (DEPO-PROVERA) 150 MG/ML injection Inject 150 mg into the muscle every 3 (three) months. Taken as adjunct to cancer therapy for 1 year past completing chemo.       . metFORMIN (GLUCOPHAGE-XR) 500 MG 24 hr tablet TAKE TWO TABLETS BY MOUTH ONCE DAILY  180 tablet  0  . ONETOUCH DELICA LANCETS MISC 1 application by Does not apply route 2 (two) times daily.  100 each  1   No current facility-administered medications on file prior to visit.   She is allergic to sulfonamide derivatives and sulfur.. [name]  [name]  Constitutional: Negative for activity change,  appetite change and fatigue.  HENT: Negative for hearing loss, congestion, tinnitus and ear discharge.   Eyes: Negative for visual disturbance (see optho q2y -- vision corrected to 20/20 with glasses).  Respiratory: Negative for cough, chest tightness and shortness of breath.   Cardiovascular: Negative for chest pain, palpitations and leg swelling.  Gastrointestinal: Negative for abdominal pain, diarrhea, constipation and abdominal distention.   Genitourinary: Negative for urgency, frequency, decreased urine volume and difficulty urinating.  Musculoskeletal: Negative for back pain, arthralgias and gait problem.  Skin: Negative for color change, pallor and rash.  Neurological: Negative for dizziness, light-headedness, numbness and headaches.  Hematological: Negative for adenopathy. Does not bruise/bleed easily.  Psychiatric/Behavioral: Negative for suicidal ideas, confusion, sleep disturbance, self-injury, dysphoric mood, decreased concentration and agitation.  Pt is able to read and write and can do all ADLs No risk for falling No abuse/ violence in home      Objective:     Vision by Snellen chart: opth Blood pressure 122/90, pulse 73, temperature 98.5 F (36.9 C), temperature source Oral, height 5' 9.5" (1.765 m), weight 315 lb (142.883 kg), SpO2 96.00%. Body mass index is 45.87 kg/(m^2). BP 122/90  Pulse 73  Temp(Src) 98.5 F (36.9 C) (Oral)  Ht 5' 9.5" (1.765 m)  Wt 315 lb (142.883 kg)  BMI 45.87 kg/m2  SpO2 96% General appearance: alert, cooperative, appears stated age and no distress Head: Normocephalic, without obvious abnormality, atraumatic Eyes: conjunctivae/corneas clear. PERRL, EOM's intact. Fundi benign. Ears: normal TM's and external ear canals both ears Nose: Nares normal. Septum midline. Mucosa normal. No drainage or sinus tenderness. Throat: lips, mucosa, and tongue normal; teeth and gums normal Neck: no adenopathy, no carotid bruit, no JVD, supple, symmetrical, trachea midline and thyroid not enlarged, symmetric, no tenderness/mass/nodules Back: symmetric, no curvature. ROM normal. No CVA tenderness. Lungs: clear to auscultation bilaterally Breasts: deferred--gyn Heart: regular rate and rhythm, S1, S2 normal, no murmur, click, rub or gallop Abdomen: soft, non-tender; bowel sounds normal; no masses,  no organomegaly Pelvic: deferred---gyn Extremities: extremities normal, atraumatic, no cyanosis or  edema Pulses: 2+ and symmetric Skin: Skin color, texture, turgor normal. No rashes or lesions Lymph nodes: Cervical, supraclavicular, and axillary nodes normal. Neurologic: Alert and oriented X 3, normal strength and tone. Normal symmetric reflexes. Normal coordination and gait Psych- no depression, no anxiety      Assessment:     cpe      Plan:     During the course of the visit the patient was educated and counseled about appropriate screening and preventive services including:   Pneumococcal vaccine   Influenza vaccine  Screening electrocardiogram  Screening mammography  Screening Pap smear and pelvic exam   Diabetes screening  Glaucoma screening  Advanced directives: has an advanced directive - a copy HAS NOT been provided.  Patient Instructions (the written plan) was given to the patient.    1. Left-sided low back pain without sciatica Refill meds - Ambulatory referral to Chiropractic  2. Essential hypertension Stable, cont meds - CBC with Differential - Hepatic function panel - Lipid panel - POCT urinalysis dipstick  3. Type II or unspecified type diabetes mellitus with neurological manifestations, not stated as uncontrolled Check labs, con't meds--pt only taking one metformin - Basic metabolic panel - Hemoglobin A1c - Hepatic function panel - Lipid panel - POCT urinalysis dipstick  4. Incontinence in female F/u gyn if no relief with vesicare - solifenacin (VESICARE) 10 MG tablet; Take 1 tablet (10 mg total) by mouth  daily.  Dispense: 30 tablet; Refill: 5  5. Medicare welcome exam    6. Muscle spasm of back  - carisoprodol (SOMA) 350 MG tablet; Take 1 tablet (350 mg total) by mouth 4 (four) times daily as needed for muscle spasms.  Dispense: 30 tablet; Refill: 0 - HYDROcodone-acetaminophen (NORCO/VICODIN) 5-325 MG per tablet; 1-2 tabs po q6h prn pain  Dispense: 60 tablet; Refill: 0

## 2014-04-25 NOTE — Patient Instructions (Signed)
Preventive Care for Adults A healthy lifestyle and preventive care can promote health and wellness. Preventive health guidelines for women include the following key practices.  A routine yearly physical is a good way to check with your health care provider about your health and preventive screening. It is a chance to share any concerns and updates on your health and to receive a thorough exam.  Visit your dentist for a routine exam and preventive care every 6 months. Brush your teeth twice a day and floss once a day. Good oral hygiene prevents tooth decay and gum disease.  The frequency of eye exams is based on your age, health, family medical history, use of contact lenses, and other factors. Follow your health care provider's recommendations for frequency of eye exams.  Eat a healthy diet. Foods like vegetables, fruits, whole grains, low-fat dairy products, and lean protein foods contain the nutrients you need without too many calories. Decrease your intake of foods high in solid fats, added sugars, and salt. Eat the right amount of calories for you.Get information about a proper diet from your health care provider, if necessary.  Regular physical exercise is one of the most important things you can do for your health. Most adults should get at least 150 minutes of moderate-intensity exercise (any activity that increases your heart rate and causes you to sweat) each week. In addition, most adults need muscle-strengthening exercises on 2 or more days a week.  Maintain a healthy weight. The body mass index (BMI) is a screening tool to identify possible weight problems. It provides an estimate of body fat based on height and weight. Your health care provider can find your BMI, and can help you achieve or maintain a healthy weight.For adults 20 years and older:  A BMI below 18.5 is considered underweight.  A BMI of 18.5 to 24.9 is normal.  A BMI of 25 to 29.9 is considered overweight.  A BMI of  30 and above is considered obese.  Maintain normal blood lipids and cholesterol levels by exercising and minimizing your intake of saturated fat. Eat a balanced diet with plenty of fruit and vegetables. Blood tests for lipids and cholesterol should begin at age 52 and be repeated every 5 years. If your lipid or cholesterol levels are high, you are over 50, or you are at high risk for heart disease, you may need your cholesterol levels checked more frequently.Ongoing high lipid and cholesterol levels should be treated with medicines if diet and exercise are not working.  If you smoke, find out from your health care provider how to quit. If you do not use tobacco, do not start.  Lung cancer screening is recommended for adults aged 37-80 years who are at high risk for developing lung cancer because of a history of smoking. A yearly low-dose CT scan of the lungs is recommended for people who have at least a 30-pack-year history of smoking and are a current smoker or have quit within the past 15 years. A pack year of smoking is smoking an average of 1 pack of cigarettes a day for 1 year (for example: 1 pack a day for 30 years or 2 packs a day for 15 years). Yearly screening should continue until the smoker has stopped smoking for at least 15 years. Yearly screening should be stopped for people who develop a health problem that would prevent them from having lung cancer treatment.  If you are pregnant, do not drink alcohol. If you are breastfeeding,  be very cautious about drinking alcohol. If you are not pregnant and choose to drink alcohol, do not have more than 1 drink per day. One drink is considered to be 12 ounces (355 mL) of beer, 5 ounces (148 mL) of wine, or 1.5 ounces (44 mL) of liquor.  Avoid use of street drugs. Do not share needles with anyone. Ask for help if you need support or instructions about stopping the use of drugs.  High blood pressure causes heart disease and increases the risk of  stroke. Your blood pressure should be checked at least every 1 to 2 years. Ongoing high blood pressure should be treated with medicines if weight loss and exercise do not work.  If you are 75-52 years old, ask your health care provider if you should take aspirin to prevent strokes.  Diabetes screening involves taking a blood sample to check your fasting blood sugar level. This should be done once every 3 years, after age 15, if you are within normal weight and without risk factors for diabetes. Testing should be considered at a younger age or be carried out more frequently if you are overweight and have at least 1 risk factor for diabetes.  Breast cancer screening is essential preventive care for women. You should practice "breast self-awareness." This means understanding the normal appearance and feel of your breasts and may include breast self-examination. Any changes detected, no matter how small, should be reported to a health care provider. Women in their 58s and 30s should have a clinical breast exam (CBE) by a health care provider as part of a regular health exam every 1 to 3 years. After age 16, women should have a CBE every year. Starting at age 53, women should consider having a mammogram (breast X-ray test) every year. Women who have a family history of breast cancer should talk to their health care provider about genetic screening. Women at a high risk of breast cancer should talk to their health care providers about having an MRI and a mammogram every year.  Breast cancer gene (BRCA)-related cancer risk assessment is recommended for women who have family members with BRCA-related cancers. BRCA-related cancers include breast, ovarian, tubal, and peritoneal cancers. Having family members with these cancers may be associated with an increased risk for harmful changes (mutations) in the breast cancer genes BRCA1 and BRCA2. Results of the assessment will determine the need for genetic counseling and  BRCA1 and BRCA2 testing.  Routine pelvic exams to screen for cancer are no longer recommended for nonpregnant women who are considered low risk for cancer of the pelvic organs (ovaries, uterus, and vagina) and who do not have symptoms. Ask your health care provider if a screening pelvic exam is right for you.  If you have had past treatment for cervical cancer or a condition that could lead to cancer, you need Pap tests and screening for cancer for at least 20 years after your treatment. If Pap tests have been discontinued, your risk factors (such as having a new sexual partner) need to be reassessed to determine if screening should be resumed. Some women have medical problems that increase the chance of getting cervical cancer. In these cases, your health care provider may recommend more frequent screening and Pap tests.  The HPV test is an additional test that may be used for cervical cancer screening. The HPV test looks for the virus that can cause the cell changes on the cervix. The cells collected during the Pap test can be  tested for HPV. The HPV test could be used to screen women aged 47 years and older, and should be used in women of any age who have unclear Pap test results. After the age of 36, women should have HPV testing at the same frequency as a Pap test.  Colorectal cancer can be detected and often prevented. Most routine colorectal cancer screening begins at the age of 38 years and continues through age 58 years. However, your health care provider may recommend screening at an earlier age if you have risk factors for colon cancer. On a yearly basis, your health care provider may provide home test kits to check for hidden blood in the stool. Use of a small camera at the end of a tube, to directly examine the colon (sigmoidoscopy or colonoscopy), can detect the earliest forms of colorectal cancer. Talk to your health care provider about this at age 64, when routine screening begins. Direct  exam of the colon should be repeated every 5-10 years through age 21 years, unless early forms of pre-cancerous polyps or small growths are found.  People who are at an increased risk for hepatitis B should be screened for this virus. You are considered at high risk for hepatitis B if:  You were born in a country where hepatitis B occurs often. Talk with your health care provider about which countries are considered high risk.  Your parents were born in a high-risk country and you have not received a shot to protect against hepatitis B (hepatitis B vaccine).  You have HIV or AIDS.  You use needles to inject street drugs.  You live with, or have sex with, someone who has Hepatitis B.  You get hemodialysis treatment.  You take certain medicines for conditions like cancer, organ transplantation, and autoimmune conditions.  Hepatitis C blood testing is recommended for all people born from [birth year] through 1965 and any individual with known risks for hepatitis C.  Practice safe sex. Use condoms and avoid high-risk sexual practices to reduce the spread of sexually transmitted infections (STIs). STIs include gonorrhea, chlamydia, syphilis, trichomonas, herpes, HPV, and human immunodeficiency virus (HIV). Herpes, HIV, and HPV are viral illnesses that have no cure. They can result in disability, cancer, and death.  You should be screened for sexually transmitted illnesses (STIs) including gonorrhea and chlamydia if:  You are sexually active and are younger than 24 years.  You are older than 24 years and your health care provider tells you that you are at risk for this type of infection.  Your sexual activity has changed since you were last screened and you are at an increased risk for chlamydia or gonorrhea. Ask your health care provider if you are at risk.  If you are at risk of being infected with HIV, it is recommended that you take a prescription medicine daily to prevent HIV infection. This is  called preexposure prophylaxis (PrEP). You are considered at risk if:  You are a heterosexual woman, are sexually active, and are at increased risk for HIV infection.  You take drugs by injection.  You are sexually active with a partner who has HIV.  Talk with your health care provider about whether you are at high risk of being infected with HIV. If you choose to begin PrEP, you should first be tested for HIV. You should then be tested every 3 months for as long as you are taking PrEP.  Osteoporosis is a disease in which the bones lose minerals and strength  with aging. This can result in serious bone fractures or breaks. The risk of osteoporosis can be identified using a bone density scan. Women ages 65 years and over and women at risk for fractures or osteoporosis should discuss screening with their health care providers. Ask your health care provider whether you should take a calcium supplement or vitamin D to reduce the rate of osteoporosis.  Menopause can be associated with physical symptoms and risks. Hormone replacement therapy is available to decrease symptoms and risks. You should talk to your health care provider about whether hormone replacement therapy is right for you.  Use sunscreen. Apply sunscreen liberally and repeatedly throughout the day. You should seek shade when your shadow is shorter than you. Protect yourself by wearing long sleeves, pants, a wide-brimmed hat, and sunglasses year round, whenever you are outdoors.  Once a month, do a whole body skin exam, using a mirror to look at the skin on your back. Tell your health care provider of new moles, moles that have irregular borders, moles that are larger than a pencil eraser, or moles that have changed in shape or color.  Stay current with required vaccines (immunizations).  Influenza vaccine. All adults should be immunized every year.  Tetanus, diphtheria, and acellular pertussis (Td, Tdap) vaccine. Pregnant women should  receive 1 dose of Tdap vaccine during each pregnancy. The dose should be obtained regardless of the length of time since the last dose. Immunization is preferred during the 27th-36th week of gestation. An adult who has not previously received Tdap or who does not know her vaccine status should receive 1 dose of Tdap. This initial dose should be followed by tetanus and diphtheria toxoids (Td) booster doses every 10 years. Adults with an unknown or incomplete history of completing a 3-dose immunization series with Td-containing vaccines should begin or complete a primary immunization series including a Tdap dose. Adults should receive a Td booster every 10 years.  Varicella vaccine. An adult without evidence of immunity to varicella should receive 2 doses or a second dose if she has previously received 1 dose. Pregnant females who do not have evidence of immunity should receive the first dose after pregnancy. This first dose should be obtained before leaving the health care facility. The second dose should be obtained 4-8 weeks after the first dose.  Human papillomavirus (HPV) vaccine. Females aged 13-26 years who have not received the vaccine previously should obtain the 3-dose series. The vaccine is not recommended for use in pregnant females. However, pregnancy testing is not needed before receiving a dose. If a female is found to be pregnant after receiving a dose, no treatment is needed. In that case, the remaining doses should be delayed until after the pregnancy. Immunization is recommended for any person with an immunocompromised condition through the age of 26 years if she did not get any or all doses earlier. During the 3-dose series, the second dose should be obtained 4-8 weeks after the first dose. The third dose should be obtained 24 weeks after the first dose and 16 weeks after the second dose.  Zoster vaccine. One dose is recommended for adults aged 60 years or older unless certain conditions are  present.  Measles, mumps, and rubella (MMR) vaccine. Adults born before 1957 generally are considered immune to measles and mumps. Adults born in 1957 or later should have 1 or more doses of MMR vaccine unless there is a contraindication to the vaccine or there is laboratory evidence of immunity to   each of the three diseases. A routine second dose of MMR vaccine should be obtained at least 28 days after the first dose for students attending postsecondary schools, health care workers, or international travelers. People who received inactivated measles vaccine or an unknown type of measles vaccine during 1963-1967 should receive 2 doses of MMR vaccine. People who received inactivated mumps vaccine or an unknown type of mumps vaccine before 1979 and are at high risk for mumps infection should consider immunization with 2 doses of MMR vaccine. For females of childbearing age, rubella immunity should be determined. If there is no evidence of immunity, females who are not pregnant should be vaccinated. If there is no evidence of immunity, females who are pregnant should delay immunization until after pregnancy. Unvaccinated health care workers born before 1957 who lack laboratory evidence of measles, mumps, or rubella immunity or laboratory confirmation of disease should consider measles and mumps immunization with 2 doses of MMR vaccine or rubella immunization with 1 dose of MMR vaccine.  Pneumococcal 13-valent conjugate (PCV13) vaccine. When indicated, a person who is uncertain of her immunization history and has no record of immunization should receive the PCV13 vaccine. An adult aged 19 years or older who has certain medical conditions and has not been previously immunized should receive 1 dose of PCV13 vaccine. This PCV13 should be followed with a dose of pneumococcal polysaccharide (PPSV23) vaccine. The PPSV23 vaccine dose should be obtained at least 8 weeks after the dose of PCV13 vaccine. An adult aged 19  years or older who has certain medical conditions and previously received 1 or more doses of PPSV23 vaccine should receive 1 dose of PCV13. The PCV13 vaccine dose should be obtained 1 or more years after the last PPSV23 vaccine dose.  Pneumococcal polysaccharide (PPSV23) vaccine. When PCV13 is also indicated, PCV13 should be obtained first. All adults aged 65 years and older should be immunized. An adult younger than age 65 years who has certain medical conditions should be immunized. Any person who resides in a nursing home or long-term care facility should be immunized. An adult smoker should be immunized. People with an immunocompromised condition and certain other conditions should receive both PCV13 and PPSV23 vaccines. People with human immunodeficiency virus (HIV) infection should be immunized as soon as possible after diagnosis. Immunization during chemotherapy or radiation therapy should be avoided. Routine use of PPSV23 vaccine is not recommended for American Indians, [place] Natives, or people younger than 65 years unless there are medical conditions that require PPSV23 vaccine. When indicated, people who have unknown immunization and have no record of immunization should receive PPSV23 vaccine. One-time revaccination 5 years after the first dose of PPSV23 is recommended for people aged 19-64 years who have chronic kidney failure, nephrotic syndrome, asplenia, or immunocompromised conditions. People who received 1-2 doses of PPSV23 before age 65 years should receive another dose of PPSV23 vaccine at age 65 years or later if at least 5 years have passed since the previous dose. Doses of PPSV23 are not needed for people immunized with PPSV23 at or after age 65 years.  Meningococcal vaccine. Adults with asplenia or persistent complement component deficiencies should receive 2 doses of quadrivalent meningococcal conjugate (MenACWY-D) vaccine. The doses should be obtained at least 2 months apart.  Microbiologists working with certain meningococcal bacteria, military recruits, people at risk during an outbreak, and people who travel to or live in countries with a high rate of meningitis should be immunized. A first-year college student up through age   21 years who is living in a residence hall should receive a dose if she did not receive a dose on or after her 16th birthday. Adults who have certain high-risk conditions should receive one or more doses of vaccine.  Hepatitis A vaccine. Adults who wish to be protected from this disease, have certain high-risk conditions, work with hepatitis A-infected animals, work in hepatitis A research labs, or travel to or work in countries with a high rate of hepatitis A should be immunized. Adults who were previously unvaccinated and who anticipate close contact with an international adoptee during the first 60 days after arrival in the [place] from a country with a high rate of hepatitis A should be immunized.  Hepatitis B vaccine. Adults who wish to be protected from this disease, have certain high-risk conditions, may be exposed to blood or other infectious body fluids, are household contacts or sex partners of hepatitis B positive people, are clients or workers in certain care facilities, or travel to or work in countries with a high rate of hepatitis B should be immunized.  Haemophilus influenzae type b (Hib) vaccine. A previously unvaccinated person with asplenia or sickle cell disease or having a scheduled splenectomy should receive 1 dose of Hib vaccine. Regardless of previous immunization, a recipient of a hematopoietic stem cell transplant should receive a 3-dose series 6-12 months after her successful transplant. Hib vaccine is not recommended for adults with HIV infection. Preventive Services / Frequency Ages 43 to 39years  Blood pressure check.** / Every 1 to 2 years.  Lipid and cholesterol check.** / Every 5 years beginning at age  75.  Clinical breast exam.** / Every 3 years for women in their 32s and 74s.  BRCA-related cancer risk assessment.** / For women who have family members with a BRCA-related cancer (breast, ovarian, tubal, or peritoneal cancers).  Pap test.** / Every 2 years from ages 65 through 91. Every 3 years starting at age 34 through age 93 or 72 with a history of 3 consecutive normal Pap tests.  HPV screening.** / Every 3 years from ages 46 through ages 53 to 26 with a history of 3 consecutive normal Pap tests.  Hepatitis C blood test.** / For any individual with known risks for hepatitis C.  Skin self-exam. / Monthly.  Influenza vaccine. / Every year.  Tetanus, diphtheria, and acellular pertussis (Tdap, Td) vaccine.** / Consult your health care provider. Pregnant women should receive 1 dose of Tdap vaccine during each pregnancy. 1 dose of Td every 10 years.  Varicella vaccine.** / Consult your health care provider. Pregnant females who do not have evidence of immunity should receive the first dose after pregnancy.  HPV vaccine. / 3 doses over 6 months, if 70 and younger. The vaccine is not recommended for use in pregnant females. However, pregnancy testing is not needed before receiving a dose.  Measles, mumps, rubella (MMR) vaccine.** / You need at least 1 dose of MMR if you were born in 1957 or later. You may also need a 2nd dose. For females of childbearing age, rubella immunity should be determined. If there is no evidence of immunity, females who are not pregnant should be vaccinated. If there is no evidence of immunity, females who are pregnant should delay immunization until after pregnancy.  Pneumococcal 13-valent conjugate (PCV13) vaccine.** / Consult your health care provider.  Pneumococcal polysaccharide (PPSV23) vaccine.** / 1 to 2 doses if you smoke cigarettes or if you have certain conditions.  Meningococcal vaccine.** /  1 dose if you are age 70 to 51 years and a [place] living in a residence hall, or have one of several medical conditions, you need to get vaccinated against meningococcal disease. You may also need additional booster doses.  Hepatitis A vaccine.** / Consult your health care provider.  Hepatitis B vaccine.** / Consult your health care provider.  Haemophilus influenzae type b (Hib) vaccine.** / Consult your health care provider. Ages 40 to 64years  Blood pressure check.** / Every 1 to 2 years.  Lipid and cholesterol check.** / Every 5 years beginning at age 58 years.  Lung cancer screening. / Every year if you are aged 56-80 years and have a 30-pack-year history of smoking and currently smoke or have quit within the past 15 years. Yearly screening is stopped once you have quit smoking for at least 15 years or develop a health problem that would prevent you from having lung cancer treatment.  Clinical breast exam.** / Every year after age 35 years.  BRCA-related cancer risk assessment.** / For women who have family members with a BRCA-related cancer (breast, ovarian, tubal, or peritoneal cancers).  Mammogram.** / Every year beginning at age 109 years and continuing for as long as you are in good health. Consult with your health care provider.  Pap test.** / Every 3 years starting at age 44 years through age 94 or 70 years with a history of 3 consecutive normal Pap tests.  HPV screening.** / Every 3 years from ages 109 years through ages 50 to 30 years with a history of 3 consecutive normal Pap tests.  Fecal occult blood test (FOBT) of stool. / Every year beginning at age 73 years and continuing until age 59 years. You may not need to do this test if you get a colonoscopy every 10 years.  Flexible sigmoidoscopy or colonoscopy.** / Every 5 years for a flexible sigmoidoscopy or every 10 years for a colonoscopy beginning at age 68 years and continuing until age 12 years.  Hepatitis C blood test.** / For all people born from [birth year] through  1965 and any individual with known risks for hepatitis C.  Skin self-exam. / Monthly.  Influenza vaccine. / Every year.  Tetanus, diphtheria, and acellular pertussis (Tdap/Td) vaccine.** / Consult your health care provider. Pregnant women should receive 1 dose of Tdap vaccine during each pregnancy. 1 dose of Td every 10 years.  Varicella vaccine.** / Consult your health care provider. Pregnant females who do not have evidence of immunity should receive the first dose after pregnancy.  Zoster vaccine.** / 1 dose for adults aged 2 years or older.  Measles, mumps, rubella (MMR) vaccine.** / You need at least 1 dose of MMR if you were born in 1957 or later. You may also need a 2nd dose. For females of childbearing age, rubella immunity should be determined. If there is no evidence of immunity, females who are not pregnant should be vaccinated. If there is no evidence of immunity, females who are pregnant should delay immunization until after pregnancy.  Pneumococcal 13-valent conjugate (PCV13) vaccine.** / Consult your health care provider.  Pneumococcal polysaccharide (PPSV23) vaccine.** / 1 to 2 doses if you smoke cigarettes or if you have certain conditions.  Meningococcal vaccine.** / Consult your health care provider.  Hepatitis A vaccine.** / Consult your health care provider.  Hepatitis B vaccine.** / Consult your health care provider.  Haemophilus influenzae type b (Hib) vaccine.** / Consult your health care provider. Ages 48 years  and over  Blood pressure check.** / Every 1 to 2 years.  Lipid and cholesterol check.** / Every 5 years beginning at age 84 years.  Lung cancer screening. / Every year if you are aged 50-80 years and have a 30-pack-year history of smoking and currently smoke or have quit within the past 15 years. Yearly screening is stopped once you have quit smoking for at least 15 years or develop a health problem that would prevent you from having lung cancer  treatment.  Clinical breast exam.** / Every year after age 24 years.  BRCA-related cancer risk assessment.** / For women who have family members with a BRCA-related cancer (breast, ovarian, tubal, or peritoneal cancers).  Mammogram.** / Every year beginning at age 14 years and continuing for as long as you are in good health. Consult with your health care provider.  Pap test.** / Every 3 years starting at age 17 years through age 31 or 74 years with 3 consecutive normal Pap tests. Testing can be stopped between 65 and 70 years with 3 consecutive normal Pap tests and no abnormal Pap or HPV tests in the past 10 years.  HPV screening.** / Every 3 years from ages 30 years through ages 70 or 28 years with a history of 3 consecutive normal Pap tests. Testing can be stopped between 65 and 70 years with 3 consecutive normal Pap tests and no abnormal Pap or HPV tests in the past 10 years.  Fecal occult blood test (FOBT) of stool. / Every year beginning at age 64 years and continuing until age 92 years. You may not need to do this test if you get a colonoscopy every 10 years.  Flexible sigmoidoscopy or colonoscopy.** / Every 5 years for a flexible sigmoidoscopy or every 10 years for a colonoscopy beginning at age 73 years and continuing until age 39 years.  Hepatitis C blood test.** / For all people born from [birth year] through 1967 and any individual with known risks for hepatitis C.  Osteoporosis screening.** / A one-time screening for women ages 35 years and over and women at risk for fractures or osteoporosis.  Skin self-exam. / Monthly.  Influenza vaccine. / Every year.  Tetanus, diphtheria, and acellular pertussis (Tdap/Td) vaccine.** / 1 dose of Td every 10 years.  Varicella vaccine.** / Consult your health care provider.  Zoster vaccine.** / 1 dose for adults aged 59 years or older.  Pneumococcal 13-valent conjugate (PCV13) vaccine.** / Consult your health care provider.  Pneumococcal  polysaccharide (PPSV23) vaccine.** / 1 dose for all adults aged 8 years and older.  Meningococcal vaccine.** / Consult your health care provider.  Hepatitis A vaccine.** / Consult your health care provider.  Hepatitis B vaccine.** / Consult your health care provider.  Haemophilus influenzae type b (Hib) vaccine.** / Consult your health care provider. ** Family history and personal history of risk and conditions may change your health care provider's recommendations. Document Released: 12/09/2001 Document Revised: 10/18/2013 Document Reviewed: 03/10/2011 [facility] Patient Information 2015 [place], [place]. This information is not intended to replace advice given to you by your health care provider. Make sure you discuss any questions you have with your health care provider.

## 2014-04-25 NOTE — Progress Notes (Signed)
Pre visit review using our clinic review tool, if applicable. No additional management support is needed unless otherwise documented below in the visit note. 

## 2014-04-25 NOTE — Addendum Note (Signed)
Addended by: Ewing Schlein on: 04/25/2014 02:03 PM   Modules accepted: Orders

## 2014-04-26 ENCOUNTER — Telehealth: Payer: Self-pay | Admitting: Family Medicine

## 2014-04-26 LAB — POCT URINALYSIS DIPSTICK
BILIRUBIN UA: NEGATIVE
Blood, UA: NEGATIVE
GLUCOSE UA: NEGATIVE
Ketones, UA: NEGATIVE
Leukocytes, UA: NEGATIVE
NITRITE UA: NEGATIVE
Urobilinogen, UA: 0.2
pH, UA: 6

## 2014-04-26 NOTE — Telephone Encounter (Signed)
Relevant patient education mailed to patient.  

## 2014-06-05 DIAGNOSIS — E109 Type 1 diabetes mellitus without complications: Secondary | ICD-10-CM | POA: Diagnosis not present

## 2014-06-05 DIAGNOSIS — N189 Chronic kidney disease, unspecified: Secondary | ICD-10-CM | POA: Diagnosis not present

## 2014-06-05 DIAGNOSIS — E119 Type 2 diabetes mellitus without complications: Secondary | ICD-10-CM | POA: Diagnosis not present

## 2014-06-05 DIAGNOSIS — R5381 Other malaise: Secondary | ICD-10-CM | POA: Diagnosis not present

## 2014-06-12 ENCOUNTER — Other Ambulatory Visit: Payer: Self-pay | Admitting: Family Medicine

## 2014-06-14 ENCOUNTER — Encounter: Payer: Self-pay | Admitting: Internal Medicine

## 2014-06-14 ENCOUNTER — Ambulatory Visit (INDEPENDENT_AMBULATORY_CARE_PROVIDER_SITE_OTHER): Payer: Medicare Other | Admitting: Internal Medicine

## 2014-06-14 VITALS — BP 134/68 | HR 87 | Temp 98.4°F | Wt 310.5 lb

## 2014-06-14 DIAGNOSIS — J209 Acute bronchitis, unspecified: Secondary | ICD-10-CM

## 2014-06-14 MED ORDER — AZITHROMYCIN 250 MG PO TABS: ORAL_TABLET | ORAL | Status: DC

## 2014-06-14 NOTE — Progress Notes (Signed)
Subjective:    Patient ID: [name], female    DOB: 06/15/1971, 43 y.o.   MRN: [id number]  DOS:  06/14/2014 Type of visit - description: acute History: Symptoms started 3 days ago: Low-grade fever, sore throat, frontal headache that increased with cough, anterior chest pain with cough only, aches and pains. Not taking any medication for her symptoms, no other family members affected.  ROS Has sinus pain and congestion but not blowing any discharge. No nausea, vomiting or diarrhea. + cough w/ some sputum production, beige in color. No rash  Past Medical History  Diagnosis Date  . Hypertension     during pregnancy  . Molar pregnancy   . Diabetes mellitus   . Gestational trophoblastic disease   . Morbid obesity   . Cancer     uterine  . Cardiomyopathy due to chemotherapy     Past Surgical History  Procedure Laterality Date  . Cesarean section      x2  . Right breast biopsy      x2  . Dilation and curettage of uterus      History   Social History  . Marital Status: Legally Separated    Spouse Name: N/A    Number of Children: 1  . 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 Smoking status: Never Smoker   . Smokeless tobacco: Not on file  . Alcohol Use: No  . Drug Use: No  . Sexual Activity: Yes   Other Topics Concern  . Not on file   Social History Narrative  . No narrative on file        Medication List       This list is accurate as of: 06/14/14  6:36 PM.  Always use your most recent med list.               amLODipine 5 MG tablet  Commonly known as:  NORVASC  Take 5 mg by mouth daily.     azithromycin 250 MG tablet  Commonly known as:  ZITHROMAX Z-PAK  2 tabs the first day, then 1 tab a day x 4 days     carvedilol 6.25 MG tablet  Commonly known as:  COREG  Take 6.25 mg by mouth 2 (two) times daily with a meal.     ferrous sulfate 325 (65 FE) MG tablet  Take 325  mg by mouth every other day.     glucose blood test strip  Commonly known as:  ONE TOUCH ULTRA TEST  Check blood sugar two times a day. Dx code: 250.72     lisinopril 20 MG tablet  Commonly known as:  PRINIVIL,ZESTRIL  TAKE ONE TABLET BY MOUTH ONCE DAILY     medroxyPROGESTERone 150 MG/ML injection  Commonly known as:  DEPO-PROVERA  Inject 150 mg into the muscle every 3 (three) months. Taken as adjunct to cancer therapy for 1 year past completing chemo.     metFORMIN 500 MG 24 hr tablet  Commonly known as:  GLUCOPHAGE-XR  TAKE TWO TABLETS BY MOUTH ONCE DAILY     ONETOUCH DELICA LANCETS Misc  1 application by Does not apply route 2 (two) times daily.     solifenacin 10 MG tablet  Commonly known as:  VESICARE  Take 1 tablet (10 mg total) by mouth daily.           Objective:   Physical Exam BP  134/68  Pulse 87  Temp(Src) 98.4 F (36.9 C) (Oral)  Wt 310 lb 8 oz (140.842 kg)  SpO2 97%  General -- alert, well-developed, NAD.    HEENT-- Not pale. TMs: R slt bulge, slt red; L normal. Throat symmetric, no redness or discharge. Face symmetric, sinuses  tender to palpation through out . Nose   congested. Lungs -- normal respiratory effort, no intercostal retractions, no accessory muscle use, and ronchi w/ cough, end exp wheezing?Marland Kitchen No crackles  Heart-- normal rate, regular rhythm, no murmur.  A Extremities-- no pretibial edema bilaterally  Neurologic--  alert & oriented X3. Speech normal, gait appropriate for age, strength symmetric and appropriate for age.    Psych-- Cognition and judgment appear intact. Cooperative with normal attention span and concentration. No anxious or depressed appearing.       Assessment & Plan:   Bronchitis, mild otitis media. Patient present with findings consistent with bronchitis and otitis, mild bronchospasm? No history of asthma Plan---Z-Pak, Mucinex, nasal steroids. If not improving she will call, albuterol?

## 2014-06-14 NOTE — Progress Notes (Signed)
Pre-visit discussion using our clinic review tool. No additional management support is needed unless otherwise documented below in the visit note.  

## 2014-06-14 NOTE — Patient Instructions (Signed)
  Rest, fluids , tylenol For cough, take Mucinex DM twice a day as needed  For congestion use OTC Nasocort: 2 nasal sprays on each side of the nose daily until you feel better   Take the antibiotic as prescribed  (zpack) Call if no better in few days Call anytime if the symptoms are severe

## 2014-07-17 ENCOUNTER — Other Ambulatory Visit: Payer: Self-pay

## 2014-07-17 DIAGNOSIS — E1139 Type 2 diabetes mellitus with other diabetic ophthalmic complication: Secondary | ICD-10-CM | POA: Diagnosis not present

## 2014-07-17 DIAGNOSIS — H5210 Myopia, unspecified eye: Secondary | ICD-10-CM | POA: Diagnosis not present

## 2014-07-17 DIAGNOSIS — E119 Type 2 diabetes mellitus without complications: Secondary | ICD-10-CM | POA: Diagnosis not present

## 2014-07-17 DIAGNOSIS — Z1231 Encounter for screening mammogram for malignant neoplasm of breast: Secondary | ICD-10-CM

## 2014-07-17 DIAGNOSIS — H35039 Hypertensive retinopathy, unspecified eye: Secondary | ICD-10-CM | POA: Diagnosis not present

## 2014-08-11 ENCOUNTER — Other Ambulatory Visit: Payer: Self-pay

## 2014-08-12 ENCOUNTER — Telehealth: Payer: Self-pay | Admitting: Oncology

## 2014-08-12 NOTE — Telephone Encounter (Signed)
, °

## 2014-08-15 ENCOUNTER — Ambulatory Visit (INDEPENDENT_AMBULATORY_CARE_PROVIDER_SITE_OTHER): Payer: Medicare Other

## 2014-08-15 DIAGNOSIS — Z23 Encounter for immunization: Secondary | ICD-10-CM | POA: Diagnosis not present

## 2014-08-22 ENCOUNTER — Ambulatory Visit
Admission: RE | Admit: 2014-08-22 | Discharge: 2014-08-22 | Disposition: A | Discharge status: Home / Self Care | Payer: Medicare Other | Source: Ambulatory Visit

## 2014-08-22 DIAGNOSIS — Z1231 Encounter for screening mammogram for malignant neoplasm of breast: Secondary | ICD-10-CM | POA: Diagnosis not present

## 2014-09-24 ENCOUNTER — Other Ambulatory Visit: Payer: Self-pay | Admitting: Oncology

## 2014-10-03 DIAGNOSIS — E109 Type 1 diabetes mellitus without complications: Secondary | ICD-10-CM | POA: Diagnosis not present

## 2014-10-04 ENCOUNTER — Telehealth: Payer: Self-pay | Admitting: Oncology

## 2014-10-04 ENCOUNTER — Ambulatory Visit (HOSPITAL_BASED_OUTPATIENT_CLINIC_OR_DEPARTMENT_OTHER): Payer: Medicare Other | Admitting: Oncology

## 2014-10-04 ENCOUNTER — Other Ambulatory Visit (HOSPITAL_BASED_OUTPATIENT_CLINIC_OR_DEPARTMENT_OTHER): Payer: Medicare Other

## 2014-10-04 ENCOUNTER — Encounter: Payer: Self-pay | Admitting: Oncology

## 2014-10-04 VITALS — BP 141/79 | HR 99 | Temp 98.5°F | Resp 20 | Ht 69.5 in | Wt 314.1 lb

## 2014-10-04 DIAGNOSIS — O010 Classical hydatidiform mole: Secondary | ICD-10-CM

## 2014-10-04 DIAGNOSIS — I10 Essential (primary) hypertension: Secondary | ICD-10-CM

## 2014-10-04 DIAGNOSIS — Z9221 Personal history of antineoplastic chemotherapy: Secondary | ICD-10-CM | POA: Diagnosis not present

## 2014-10-04 DIAGNOSIS — O019 Hydatidiform mole, unspecified: Secondary | ICD-10-CM

## 2014-10-04 DIAGNOSIS — E119 Type 2 diabetes mellitus without complications: Secondary | ICD-10-CM | POA: Diagnosis not present

## 2014-10-04 LAB — CBC WITH DIFFERENTIAL/PLATELET
BASO%: 0.8 % (ref 0.0–2.0)
Basophils Absolute: 0.1 10*3/uL (ref 0.0–0.1)
EOS ABS: 0.1 10*3/uL (ref 0.0–0.5)
EOS%: 1.9 % (ref 0.0–7.0)
HCT: 39.8 % (ref 34.8–46.6)
HEMOGLOBIN: 12.3 g/dL (ref 11.6–15.9)
LYMPH%: 39.3 % (ref 14.0–49.7)
MCH: 22.3 pg — ABNORMAL LOW (ref 25.1–34.0)
MCHC: 30.8 g/dL — ABNORMAL LOW (ref 31.5–36.0)
MCV: 72.4 fL — ABNORMAL LOW (ref 79.5–101.0)
MONO#: 0.4 10*3/uL (ref 0.1–0.9)
MONO%: 5.7 % (ref 0.0–14.0)
NEUT%: 52.3 % (ref 38.4–76.8)
NEUTROS ABS: 3.4 10*3/uL (ref 1.5–6.5)
Platelets: 204 10*3/uL (ref 145–400)
RBC: 5.49 10*6/uL — AB (ref 3.70–5.45)
RDW: 15.3 % — AB (ref 11.2–14.5)
WBC: 6.5 10*3/uL (ref 3.9–10.3)
lymph#: 2.5 10*3/uL (ref 0.9–3.3)

## 2014-10-04 NOTE — Telephone Encounter (Signed)
per pof to sch appt w/pt gyn-est pt-to make appt-cld oofice to make appt-gave pt time & date of appt

## 2014-10-04 NOTE — Progress Notes (Signed)
OFFICE PROGRESS NOTE   10/04/2014   Physicians: (W.Brewster), Wallene Huh, Y. Lowne, Naima Dillard,( K.Clance)  INTERVAL HISTORY:  Patient is seen, alone for visit, in yearly follow up of previous chemotherapy used for gestational trophoblastic disease   Still some irregular menstrual periods, every 3-4 months. She is to follow now with Dr Charlesetta Garibaldi, had some problems with insurance which she believes resolved now and needs to be scheduled there. Dr Montez Morita sees her ~ every 4 months, has just had new CPAP mask and machine calibrated yesterday. She will see Dr Etter Sjogren again in early Jan.  [facility] 30 min for all of this past year, which she has enjoyed and can tell stamina is improved; we have discussed trying to increase amount of time she is doing the swimming as she has not lost as much weight yet as needed.    Daughter is in 5th grade, just won prize at [employer].  Flu vaccine done No PAC   ONCOLOGIC HISTORY History is of presumed pregnancy in Jan. 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 She had rising BHCG after the D&C which did not improve significantly with initial methotrexate. CT chest/abd/pelvis 03-20-11 had small pulmonary nodules (at least 4 in left and at least 3 on right) as well as large intrauterine mass. She was seen by Dr.Brewster and received EMA-CO x 6 cycles thru August 2012. 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 B HCGs have remained <2.0. Course was complicated by DVT at site of initial RUE PICC, 3 months of coumadin completed in midOct 2012. Repeat CT chest 09-30-2011 showed only stable 3 mm RUL nodule. Recommendation for 18 month surveillance was based on review by Dr Cindie Laroche.Last BHCG was <2.0 on 12-23-12.  She saw Dr Skeet Latch last 01-2014; she is to follow  now with gynecology, with prn follow up with gyn oncology.   [name] as above, also: Ongoing problems with insomnia, CPAP just adjusted as above.Believes she slept ~ 4-5 hrs last pm. No recent infectious illness. No increased SOB, no cough or chest pain. Bowels ok. No changes in breast self exam. Remainder of 10 point [name] negative.  Objective:  Vital signs in last 24 hours:  BP 141/79 mmHg  Pulse 99  Temp(Src) 98.5 F (36.9 C) (Oral)  Resp 20  Ht 5' 9.5" (1.765 m)  Wt 314 lb 1.6 oz (142.475 kg)  BMI 45.74 kg/m2  SpO2 100% weight down 7 lbs from 09-2013.  Alert, oriented and appropriate. Ambulatory with some difficulty. NAD, respirations not labored RA   HEENT:PERRL, sclerae not icteric. Oral mucosa moist without lesions, posterior pharynx clear.  Neck supple. No JVD.  Lymphatics:no cervical,supraclavicular, axillary or inguinal adenopathy Resp: somewhat diminished BS bilateral with habitus, otherwise clear to auscultation bilaterally and normal percussion bilaterally Cardio: regular rate and rhythm. No gallop. GI: abdomen obese, soft, nontender, not distended, no mass or organomegaly. Normally active bowel sounds. Surgical incision not remarkable. Musculoskeletal/ Extremities: without pitting edema, cords, tenderness Neuro: no change peripheral neuropathy. Otherwise nonfocal Skin without rash, ecchymosis, petechiae   Lab Results:  Results for orders placed or performed in visit on 10/04/14  CBC with Differential  Result Value Ref Range   WBC 6.5 3.9 - 10.3 10e3/uL   NEUT#  3.4 1.5 - 6.5 10e3/uL   HGB 12.3 11.6 - 15.9 g/dL   HCT 39.8 34.8 - 46.6 %   Platelets 204 145 - 400 10e3/uL   MCV 72.4 (L) 79.5 - 101.0 fL   MCH 22.3 (L) 25.1 - 34.0 pg   MCHC 30.8 (L) 31.5 - 36.0 g/dL   RBC 5.49 (H) 3.70 - 5.45 10e6/uL   RDW 15.3 (H) 11.2 - 14.5 %   lymph# 2.5 0.9 - 3.3 10e3/uL   MONO# 0.4 0.1 - 0.9 10e3/uL   Eosinophils Absolute 0.1 0.0 - 0.5 10e3/uL    Basophils Absolute 0.1 0.0 - 0.1 10e3/uL   NEUT% 52.3 38.4 - 76.8 %   LYMPH% 39.3 14.0 - 49.7 %   MONO% 5.7 0.0 - 14.0 %   EOS% 1.9 0.0 - 7.0 %   BASO% 0.8 0.0 - 2.0 %     Studies/Results:  EXAM: DIGITAL SCREENING BILATERAL MAMMOGRAM WITH 3D TOMO WITH CAD  [facility] COMPARISON: Previous exam(s).  ACR Breast Density Category c: The breast tissue is heterogeneously dense, which may obscure small masses.  FINDINGS: There are no findings suspicious for malignancy. Images were processed with CAD.  IMPRESSION: No mammographic evidence of malignancy. A result letter of this screening mammogram will be mailed directly to the patient.  RECOMMENDATION: Screening mammogram in one year.  Medications: I have reviewed the patient's current medications.  DISCUSSION: [facility], which she is enjoying.   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 She will follow now with Dr Charlesetta Garibaldi and PCP. She should have CBC and CMET ~ yearly, which is also appropriate with other medical conditions. I will see her back prn. 2.morbid obesity: encouraged her to continue exercise and diet.  3.diabetes: as #2  4.HTN and reported cardiomyopathy: care recently transferred to Dr Montez Morita 5.iron deficiency anemia improved.  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  Patient is comfortable with discussion and recommendations. She understands that I am glad to see her at any time if she or her other physicians feel that I can be of help, but I will not schedule regular follow up visit now. Time spent 15 min including >50% counseling and coordination of care. Cc this note Dr Ray Church, MD   10/04/2014, 10:16 AM

## 2014-10-04 NOTE — Telephone Encounter (Signed)
cld to make appt-on hold for 13 min-pt stated will calll & make appt herself-

## 2014-10-12 ENCOUNTER — Ambulatory Visit (HOSPITAL_BASED_OUTPATIENT_CLINIC_OR_DEPARTMENT_OTHER)
Admission: RE | Admit: 2014-10-12 | Discharge: 2014-10-12 | Disposition: A | Discharge status: Home / Self Care | Payer: Medicare Other | Source: Ambulatory Visit | Attending: Medical | Admitting: Medical

## 2014-10-12 ENCOUNTER — Other Ambulatory Visit: Payer: Self-pay

## 2014-10-12 ENCOUNTER — Encounter: Payer: Self-pay | Admitting: Medical

## 2014-10-12 ENCOUNTER — Ambulatory Visit (INDEPENDENT_AMBULATORY_CARE_PROVIDER_SITE_OTHER): Payer: Medicare Other | Admitting: Medical

## 2014-10-12 VITALS — BP 123/79 | HR 81 | Temp 98.2°F | Ht 69.5 in | Wt 313.6 lb

## 2014-10-12 DIAGNOSIS — M25571 Pain in right ankle and joints of right foot: Secondary | ICD-10-CM

## 2014-10-12 DIAGNOSIS — M79674 Pain in right toe(s): Secondary | ICD-10-CM | POA: Insufficient documentation

## 2014-10-12 DIAGNOSIS — S99921A Unspecified injury of right foot, initial encounter: Secondary | ICD-10-CM | POA: Diagnosis not present

## 2014-10-12 DIAGNOSIS — L600 Ingrowing nail: Secondary | ICD-10-CM

## 2014-10-12 DIAGNOSIS — M79671 Pain in right foot: Secondary | ICD-10-CM | POA: Diagnosis not present

## 2014-10-12 MED ORDER — CEPHALEXIN 500 MG PO CAPS: 500.0000 mg | ORAL_CAPSULE | Freq: Three times a day (TID) | ORAL | Status: DC

## 2014-10-12 MED ORDER — GLUCOSE BLOOD VI STRP: ORAL_STRIP | Status: DC

## 2014-10-12 MED ORDER — ONETOUCH ULTRASOFT LANCETS MISC: Status: AC

## 2014-10-12 NOTE — Assessment & Plan Note (Signed)
With infection.  You appear to have contused toe from trauma. The nail looks minimally ingrown and I think you have mild early infection related to trauma and slight ingrown nail. Do warm salt water soaks to foot twice daily. Ibuprofen otc and take cephalexin antibiotic. If you toe doe not feel completley better by 7-10 days notify us and would refer to podiatrist since you are diabetic.

## 2014-10-12 NOTE — Assessment & Plan Note (Signed)
Post trauma. Get xray to r/o fx.

## 2014-10-12 NOTE — Patient Instructions (Addendum)
You appear to have contused toe from trauma. The nail looks minimally ingrown and I think you have mild early infection related to trauma and slight ingrown nail. Do warm salt water soaks to foot twice daily. Ibuprofen otc and take cephalexin antibiotic. If you toe doe not feel completley better by 7-10 days notify us and would refer to podiatrist since you are diabetic.  Also get xray.  Refill of glucose strip and lancets.(will get nurse to send in the rx.)

## 2014-10-12 NOTE — Progress Notes (Signed)
Pre visit review using our clinic review tool, if applicable. No additional management support is needed unless otherwise documented below in the visit note. 

## 2014-10-12 NOTE — Progress Notes (Signed)
Subjective:    Patient ID: [name], female    DOB: [birth year], 43 y.o.   MRN: [id number]  HPI   Pt accidentally kicked edge of the furniture 2 weeks ago. Pt has mild pain rt great toe lateral aspect tip. Pain in that area a week ago. When she stumped toe had mild pain. Pt is diabetic. No yellow discharge or redness. But when walks has pain and pressure.   [name]  Constitutional: Negative for fever, chills and fatigue.  Respiratory: Negative for cough, chest tightness, shortness of breath and wheezing.   Cardiovascular: Negative for chest pain and palpitations.  Musculoskeletal:       Rt great toe pain.  Hematological: Negative for adenopathy. Does not bruise/bleed easily.   Past Medical History  Diagnosis Date  . Hypertension     during pregnancy  . Molar pregnancy   . Diabetes mellitus   . Gestational trophoblastic disease   . Morbid obesity   . Cancer     uterine  . Cardiomyopathy due to chemotherapy     History   Social History  . Marital Status: Legally Separated    Spouse Name: N/A    Number of Children: 1  . 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 Smoking status: Never Smoker   . Smokeless tobacco: Not on file  . Alcohol Use: No  . Drug Use: No  . Sexual Activity: Yes   Other Topics Concern  . Not on file   Social History Narrative    Past Surgical History  Procedure Laterality Date  . Cesarean section      x2  . Right breast biopsy      x2  . Dilation and curettage of uterus      Family History  Problem Relation Age of Onset  . Hypertension Mother   . Diabetes Paternal Grandfather   . Stroke      aunts and uncles on mother side  . Obesity    . Hypertension Father   . Hypertension Paternal Grandfather   . Hypertension Paternal Grandmother     Allergies  Allergen Reactions  . Sulfonamide Derivatives     REACTION: unspecified  . Sulfur Hives      Current Outpatient Prescriptions on File Prior to Visit  Medication Sig Dispense Refill  . amLODipine (NORVASC) 5 MG tablet Take 5 mg by mouth daily.    . carvedilol (COREG) 6.25 MG tablet Take 6.25 mg by mouth 2 (two) times daily with a meal.    . ferrous sulfate 325 (65 FE) MG tablet Take 325 mg by mouth every other day.     Marland Kitchen glucose blood (ONE TOUCH ULTRA TEST) test strip Check blood sugar two times a day. Dx code: 250.72 180 each 12  . lisinopril (PRINIVIL,ZESTRIL) 20 MG tablet TAKE ONE TABLET BY MOUTH ONCE DAILY 90 tablet 1  . lovastatin (MEVACOR) 20 MG tablet Take 20 mg by mouth at bedtime.    . metFORMIN (GLUCOPHAGE-XR) 500 MG 24 hr tablet TAKE TWO TABLETS BY MOUTH ONCE DAILY (Patient taking differently: TAKE oneTABLETS BY MOUTH ONCE DAILY) 180 tablet 0  . ONETOUCH DELICA LANCETS MISC 1 application by Does not apply route 2 (two) times daily. 100 each 1   No current facility-administered medications on file prior to visit.    BP 123/79 mmHg  Pulse 81  Temp(Src) 98.2 F (36.8 C) (Oral)  Ht 5' 9.5" (1.765 m)  Wt 313 lb 9.6 oz (142.248 kg)  BMI 45.66 kg/m2  SpO2 96%       Objective:   Physical Exam General- no acute distress Rt foot/ great tot mild pain on palpation medial aspect distal tip.faint pink color to that area. No discharge. Toenail looks mild ingrown.       Assessment & Plan:

## 2014-11-02 ENCOUNTER — Ambulatory Visit: Payer: Medicare Other | Admitting: Family Medicine

## 2014-11-06 ENCOUNTER — Ambulatory Visit: Payer: Medicare Other | Admitting: Family Medicine

## 2014-11-16 ENCOUNTER — Telehealth: Payer: Self-pay | Admitting: *Deleted

## 2014-11-16 ENCOUNTER — Ambulatory Visit: Payer: Medicare Other | Admitting: Family Medicine

## 2014-11-16 NOTE — Telephone Encounter (Signed)
Pt did not show for appointment 11/16/14 at 8:15am for "35mo f/u & labs/jsl?Diabetic Bundle*confirmed".  Charge no show fee?

## 2014-11-16 NOTE — Telephone Encounter (Signed)
No-- but do call her ,  thanks

## 2014-11-16 NOTE — Telephone Encounter (Signed)
See note below

## 2014-11-20 ENCOUNTER — Other Ambulatory Visit: Payer: Self-pay

## 2014-11-20 MED ORDER — METFORMIN HCL ER 500 MG PO TB24: 1000.0000 mg | ORAL_TABLET | Freq: Every day | ORAL | Status: DC

## 2014-11-23 ENCOUNTER — Ambulatory Visit (INDEPENDENT_AMBULATORY_CARE_PROVIDER_SITE_OTHER): Payer: Medicare Other | Admitting: Family Medicine

## 2014-11-23 ENCOUNTER — Encounter: Payer: Self-pay | Admitting: Family Medicine

## 2014-11-23 VITALS — BP 124/80 | HR 64 | Temp 98.6°F | Wt 315.0 lb

## 2014-11-23 DIAGNOSIS — E0842 Diabetes mellitus due to underlying condition with diabetic polyneuropathy: Secondary | ICD-10-CM

## 2014-11-23 DIAGNOSIS — E119 Type 2 diabetes mellitus without complications: Secondary | ICD-10-CM | POA: Diagnosis not present

## 2014-11-23 DIAGNOSIS — E785 Hyperlipidemia, unspecified: Secondary | ICD-10-CM

## 2014-11-23 DIAGNOSIS — I10 Essential (primary) hypertension: Secondary | ICD-10-CM

## 2014-11-23 LAB — POCT URINALYSIS DIPSTICK
Bilirubin, UA: NEGATIVE
Blood, UA: NEGATIVE
GLUCOSE UA: NEGATIVE
KETONES UA: NEGATIVE
Leukocytes, UA: NEGATIVE
NITRITE UA: NEGATIVE
PH UA: 6
Protein, UA: NEGATIVE
Spec Grav, UA: 1.02
Urobilinogen, UA: NEGATIVE

## 2014-11-23 MED ORDER — GLUCOSE BLOOD VI STRP: ORAL_STRIP | Status: DC

## 2014-11-23 MED ORDER — LISINOPRIL 20 MG PO TABS: 20.0000 mg | ORAL_TABLET | Freq: Every day | ORAL | Status: DC

## 2014-11-23 MED ORDER — LOVASTATIN 20 MG PO TABS: 20.0000 mg | ORAL_TABLET | Freq: Every day | ORAL | Status: DC

## 2014-11-23 MED ORDER — METFORMIN HCL ER 500 MG PO TB24: 500.0000 mg | ORAL_TABLET | Freq: Every day | ORAL | Status: DC

## 2014-11-23 MED ORDER — PREGABALIN 50 MG PO CAPS: 50.0000 mg | ORAL_CAPSULE | Freq: Three times a day (TID) | ORAL | Status: DC

## 2014-11-23 NOTE — Progress Notes (Signed)
Subjective:    Patient ID: [name], female    DOB: Jul 09, 1971, 44 y.o.   MRN: [id number]  HPI  Patient here for f/u dm, htn, hyperlipidemia. She is also c/o dm neuropathy--- both feet.    HPI HYPERTENSION  Blood pressure range-not checking  Chest pain- no      Dyspnea- no Lightheadedness- no   Edema- no Other side effects - no   Medication compliance: good Low salt diet- yes  DIABETES  Blood Sugar ranges-60-150 Polyuria- no New Visual problems- no Hypoglycemic symptoms- lightheaded--- normally because she forgot to eat Other side effects-no Medication compliance - good Last eye exam- 05/2014 Foot exam- today  HYPERLIPIDEMIA  Medication compliance- good RUQ pain- no  Muscle aches- no Other side effects-no    Past Medical History  Diagnosis Date  . Hypertension     during pregnancy  . Molar pregnancy   . Diabetes mellitus   . Gestational trophoblastic disease   . Morbid obesity   . Cancer     uterine  . Cardiomyopathy due to chemotherapy     [name]  Constitutional: Negative for diaphoresis, appetite change, fatigue and unexpected weight change.  Eyes: Negative for pain, redness and visual disturbance.  Respiratory: Negative for cough, chest tightness, shortness of breath and wheezing.   Cardiovascular: Negative for chest pain, palpitations and leg swelling.  Endocrine: Negative for cold intolerance, heat intolerance, polydipsia, polyphagia and polyuria.  Genitourinary: Negative for dysuria, frequency and difficulty urinating.  Neurological: Negative for dizziness, light-headedness, numbness and headaches.  Psychiatric/Behavioral: Negative for suicidal ideas, hallucinations, behavioral problems, confusion, sleep disturbance, self-injury, dysphoric mood, decreased concentration and agitation. The patient is not nervous/anxious and is not hyperactive.        Objective:    Physical Exam  Constitutional: She is oriented to person, place, and  time. She appears well-developed and well-nourished. No distress.  HENT:  Right Ear: External ear normal.  Left Ear: External ear normal.  Nose: Nose normal.  Mouth/Throat: Oropharynx is clear and moist.  Eyes: EOM are normal. Pupils are equal, round, and reactive to light.  Neck: Normal range of motion. Neck supple.  Cardiovascular: Normal rate, regular rhythm and normal heart sounds.   No murmur heard. Pulmonary/Chest: Effort normal and breath sounds normal. No respiratory distress. She has no wheezes. She has no rales. She exhibits no tenderness.  Musculoskeletal: Normal range of motion. She exhibits no edema or tenderness.  Neurological: She is alert and oriented to person, place, and time.  Psychiatric: She has a normal mood and affect. Her behavior is normal. Judgment and thought content normal.  Sensory exam of the foot is normal, tested with the monofilament. Good pulses, no lesions or ulcers, good peripheral pulses.   BP 124/80 mmHg  Pulse 64  Temp(Src) 98.6 F (37 C) (Oral)  Wt 315 lb (142.883 kg)  SpO2 97% Wt Readings from Last 3 Encounters:  11/23/14 315 lb (142.883 kg)  10/12/14 313 lb 9.6 oz (142.248 kg)  10/04/14 314 lb 1.6 oz (142.475 kg)     Lab Results  Component Value Date   WBC 6.5 10/04/2014   HGB 12.3 10/04/2014   HCT 39.8 10/04/2014   PLT 204 10/04/2014   GLUCOSE 102* 04/25/2014   CHOL 154 04/25/2014   TRIG 40.0 04/25/2014   HDL 57.30 04/25/2014   LDLCALC 89 04/25/2014   ALT 15 04/25/2014   AST 13 04/25/2014   NA 134* 04/25/2014   K 3.5 04/25/2014  CL 102 04/25/2014   CREATININE 0.9 04/25/2014   BUN 12 04/25/2014   CO2 26 04/25/2014   TSH 1.93 07/14/2011   INR 1.40* 08/18/2011   HGBA1C 6.5 04/25/2014   MICROALBUR 0.6 05/10/2013    Dg Foot Complete Right  10/12/2014   CLINICAL DATA:  Stab big toe 1 week ago with persistent pain  EXAM: RIGHT FOOT COMPLETE - 3+ VIEW  COMPARISON:  None.  FINDINGS: Tarsal metatarsal alignment is normal. No  fracture is seen. Joint spaces appear normal.  IMPRESSION: Negative.   Electronically Signed   By: Ivar Drape M.D.   On: 10/12/2014 16:16       Assessment & Plan:   Problem List Items Addressed This Visit    None    Visit Diagnoses    Diabetes mellitus type II, controlled    -  Primary    Relevant Medications    metFORMIN (GLUCOPHAGE-XR) 24 hr tablet    lisinopril (PRINIVIL,ZESTRIL) tablet    lovastatin (MEVACOR) tablet    Other Relevant Orders    Basic metabolic panel    Hemoglobin A1c    Microalbumin / creatinine urine ratio    POCT urinalysis dipstick    Essential hypertension        Relevant Medications    lisinopril (PRINIVIL,ZESTRIL) tablet    lovastatin (MEVACOR) tablet    Other Relevant Orders    Basic metabolic panel    Microalbumin / creatinine urine ratio    POCT urinalysis dipstick    Hyperlipidemia LDL goal <70        Relevant Medications    lisinopril (PRINIVIL,ZESTRIL) tablet    lovastatin (MEVACOR) tablet    Other Relevant Orders    Hepatic function panel    Lipid panel    Diabetic polyneuropathy associated with diabetes mellitus due to underlying condition        Relevant Medications    metFORMIN (GLUCOPHAGE-XR) 24 hr tablet    lisinopril (PRINIVIL,ZESTRIL) tablet    lovastatin (MEVACOR) tablet    pregabalin (LYRICA) capsule        Garnet Koyanagi, DO

## 2014-11-23 NOTE — Progress Notes (Signed)
Pre visit review using our clinic review tool, if applicable. No additional management support is needed unless otherwise documented below in the visit note. 

## 2014-11-23 NOTE — Patient Instructions (Signed)
Diabetes and Exercise Exercising regularly is important. It is not just about losing weight. It has many health benefits, such as:  Improving your overall fitness, flexibility, and endurance.  Increasing your bone density.  Helping with weight control.  Decreasing your body fat.  Increasing your muscle strength.  Reducing stress and tension.  Improving your overall health. People with diabetes who exercise gain additional benefits because exercise:  Reduces appetite.  Improves the body's use of blood sugar (glucose).  Helps lower or control blood glucose.  Decreases blood pressure.  Helps control blood lipids (such as cholesterol and triglycerides).  Improves the body's use of the hormone insulin by:  Increasing the body's insulin sensitivity.  Reducing the body's insulin needs.  Decreases the risk for heart disease because exercising:  Lowers cholesterol and triglycerides levels.  Increases the levels of good cholesterol (such as high-density lipoproteins [HDL]) in the body.  Lowers blood glucose levels. YOUR ACTIVITY PLAN  Choose an activity that you enjoy and set realistic goals. Your health care provider or diabetes educator can help you make an activity plan that works for you. Exercise regularly as directed by your health care provider. This includes:  Performing resistance training twice a week such as push-ups, sit-ups, lifting weights, or using resistance bands.  Performing 150 minutes of cardio exercises each week such as walking, running, or playing sports.  Staying active and spending no more than 90 minutes at one time being inactive. Even short bursts of exercise are good for you. Three 10-minute sessions spread throughout the day are just as beneficial as a single 30-minute session. Some exercise ideas include:  Taking the dog for a walk.  Taking the stairs instead of the elevator.  Dancing to your favorite song.  Doing an exercise  video.  Doing your favorite exercise with a friend. RECOMMENDATIONS FOR EXERCISING WITH TYPE 1 OR TYPE 2 DIABETES   Check your blood glucose before exercising. If blood glucose levels are greater than 240 mg/dL, check for urine ketones. Do not exercise if ketones are present.  Avoid injecting insulin into areas of the body that are going to be exercised. For example, avoid injecting insulin into:  The arms when playing tennis.  The legs when jogging.  Keep a record of:  Food intake before and after you exercise.  Expected peak times of insulin action.  Blood glucose levels before and after you exercise.  The type and amount of exercise you have done.  Review your records with your health care provider. Your health care provider will help you to develop guidelines for adjusting food intake and insulin amounts before and after exercising.  If you take insulin or oral hypoglycemic agents, watch for signs and symptoms of hypoglycemia. They include:  Dizziness.  Shaking.  Sweating.  Chills.  Confusion.  Drink plenty of water while you exercise to prevent dehydration or heat stroke. Body water is lost during exercise and must be replaced.  Talk to your health care provider before starting an exercise program to make sure it is safe for you. Remember, almost any type of activity is better than none. Document Released: 01/03/2004 Document Revised: 02/27/2014 Document Reviewed: 03/22/2013 ExitCare Patient Information 2015 [employer]. This information is not intended to replace advice given to you by your health care provider. Make sure you discuss any questions you have with your health care provider.  

## 2014-11-24 LAB — BASIC METABOLIC PANEL
BUN: 15 mg/dL (ref 6–23)
CO2: 29 meq/L (ref 19–32)
CREATININE: 0.88 mg/dL (ref 0.40–1.20)
Calcium: 9.1 mg/dL (ref 8.4–10.5)
Chloride: 103 mEq/L (ref 96–112)
GFR: 89.2 mL/min (ref >60.00)
Glucose, Bld: 83 mg/dL (ref 70–99)
POTASSIUM: 3.6 meq/L (ref 3.5–5.1)
Sodium: 138 mEq/L (ref 135–145)

## 2014-11-24 LAB — HEPATIC FUNCTION PANEL
ALT: 13 U/L (ref 0–35)
AST: 14 U/L (ref 0–37)
Albumin: 3.9 g/dL (ref 3.5–5.2)
Alkaline Phosphatase: 42 U/L (ref 39–117)
Bilirubin, Direct: 0.1 mg/dL (ref 0.0–0.3)
TOTAL PROTEIN: 8 g/dL (ref 6.0–8.3)
Total Bilirubin: 0.3 mg/dL (ref 0.2–1.2)

## 2014-11-24 LAB — LIPID PANEL
CHOL/HDL RATIO: 2
CHOLESTEROL: 141 mg/dL (ref 0–200)
HDL: 60.5 mg/dL (ref >39.00)
LDL CALC: 60 mg/dL (ref 0–99)
NonHDL: 80.5
TRIGLYCERIDES: 102 mg/dL (ref 0.0–149.0)
VLDL: 20.4 mg/dL (ref 0.0–40.0)

## 2014-11-24 LAB — MICROALBUMIN / CREATININE URINE RATIO
CREATININE, U: 273.7 mg/dL
MICROALB UR: 1 mg/dL (ref 0.0–1.9)
Microalb Creat Ratio: 0.4 mg/g (ref 0.0–30.0)

## 2014-11-24 LAB — HEMOGLOBIN A1C: HEMOGLOBIN A1C: 6.9 % — AB (ref 4.6–6.5)

## 2014-11-30 ENCOUNTER — Other Ambulatory Visit: Payer: Self-pay

## 2014-11-30 DIAGNOSIS — E119 Type 2 diabetes mellitus without complications: Secondary | ICD-10-CM

## 2014-11-30 MED ORDER — METFORMIN HCL ER 500 MG PO TB24: 500.0000 mg | ORAL_TABLET | Freq: Two times a day (BID) | ORAL | Status: AC

## 2015-02-28 ENCOUNTER — Telehealth: Payer: Self-pay | Admitting: Family Medicine

## 2015-02-28 ENCOUNTER — Other Ambulatory Visit (INDEPENDENT_AMBULATORY_CARE_PROVIDER_SITE_OTHER): Payer: Medicare Other

## 2015-02-28 ENCOUNTER — Telehealth: Payer: Self-pay

## 2015-02-28 DIAGNOSIS — E119 Type 2 diabetes mellitus without complications: Secondary | ICD-10-CM | POA: Diagnosis not present

## 2015-02-28 LAB — HEMOGLOBIN A1C: HEMOGLOBIN A1C: 6.2 % (ref 4.6–6.5)

## 2015-02-28 MED ORDER — GLUCOSE BLOOD VI STRP: ORAL_STRIP | Status: DC

## 2015-02-28 NOTE — Telephone Encounter (Signed)
Patient notified verbalized understanding

## 2015-02-28 NOTE — Telephone Encounter (Signed)
-----   Message from Rosalita Chessman, DO sent at 02/28/2015  1:27 PM EDT ----- Doristine Devoid!  Recheck 6 months

## 2015-02-28 NOTE — Telephone Encounter (Signed)
Caller name: Marinna Blane Relation to pt: Self Call back number: [phone] Pharmacy:[facility] [place], [place]  Reason for call: Pt came in office to have labs done and is requesting is needing rx for glucose blood (ONETOUCH VERIO) test strip. Please advise.

## 2015-05-25 ENCOUNTER — Ambulatory Visit: Payer: Medicare Other | Admitting: Family Medicine

## 2015-05-26 ENCOUNTER — Other Ambulatory Visit: Payer: Self-pay | Admitting: Family Medicine

## 2015-05-29 ENCOUNTER — Other Ambulatory Visit: Payer: Self-pay | Admitting: Family Medicine

## 2015-06-05 DIAGNOSIS — S43401A Unspecified sprain of right shoulder joint, initial encounter: Secondary | ICD-10-CM | POA: Diagnosis not present

## 2015-06-23 DIAGNOSIS — S43402A Unspecified sprain of left shoulder joint, initial encounter: Secondary | ICD-10-CM | POA: Diagnosis not present

## 2015-07-09 ENCOUNTER — Telehealth: Payer: Self-pay | Admitting: Family Medicine

## 2015-07-09 NOTE — Telephone Encounter (Signed)
Relation to BW:GYKZ Call back [phone]   Reason for call:  Patient relocated to [place] and would like to know if PCP can recommened a physician due to her medical history. Patient states she will need refills in the future prior to her establishing.

## 2015-07-10 NOTE — Telephone Encounter (Signed)
Do you personally know any providers in the [place] area?

## 2015-07-12 MED ORDER — LISINOPRIL 20 MG PO TABS: 20.0000 mg | ORAL_TABLET | Freq: Every day | ORAL | Status: DC

## 2015-07-12 MED ORDER — GLUCOSE BLOOD VI STRP: ORAL_STRIP | Status: DC

## 2015-07-12 NOTE — Telephone Encounter (Signed)
Message left to call the office.    KP 

## 2015-07-12 NOTE — Telephone Encounter (Signed)
Dr Tommas Olp [phone]  I don't know dr personally but she was recommended by a friend who lives there

## 2015-07-12 NOTE — Telephone Encounter (Signed)
Gave information to patient.  Sent refill for Lisinopril and test strips until she can get established.  [place] [id number]

## 2015-07-12 NOTE — Telephone Encounter (Signed)
Pt is returning your call

## 2015-07-16 ENCOUNTER — Other Ambulatory Visit: Payer: Self-pay

## 2015-07-16 MED ORDER — LISINOPRIL 20 MG PO TABS: 20.0000 mg | ORAL_TABLET | Freq: Every day | ORAL | Status: DC

## 2015-07-16 MED ORDER — GLUCOSE BLOOD VI STRP: ORAL_STRIP | Status: AC

## 2015-08-09 DIAGNOSIS — J0100 Acute maxillary sinusitis, unspecified: Secondary | ICD-10-CM | POA: Diagnosis not present

## 2015-08-12 DIAGNOSIS — E119 Type 2 diabetes mellitus without complications: Secondary | ICD-10-CM | POA: Diagnosis not present

## 2015-08-12 DIAGNOSIS — R05 Cough: Secondary | ICD-10-CM | POA: Diagnosis not present

## 2015-08-12 DIAGNOSIS — R112 Nausea with vomiting, unspecified: Secondary | ICD-10-CM | POA: Diagnosis not present

## 2015-08-12 DIAGNOSIS — J069 Acute upper respiratory infection, unspecified: Secondary | ICD-10-CM | POA: Diagnosis not present

## 2015-08-12 DIAGNOSIS — I509 Heart failure, unspecified: Secondary | ICD-10-CM | POA: Diagnosis not present

## 2015-08-12 DIAGNOSIS — D070 Carcinoma in situ of endometrium: Secondary | ICD-10-CM | POA: Diagnosis not present

## 2015-08-12 DIAGNOSIS — I10 Essential (primary) hypertension: Secondary | ICD-10-CM | POA: Diagnosis not present

## 2015-09-03 ENCOUNTER — Ambulatory Visit (HOSPITAL_BASED_OUTPATIENT_CLINIC_OR_DEPARTMENT_OTHER)
Admission: RE | Admit: 2015-09-03 | Discharge: 2015-09-03 | Disposition: A | Discharge status: Home / Self Care | Payer: Medicare Other | Source: Ambulatory Visit | Attending: Family Medicine | Admitting: Family Medicine

## 2015-09-03 ENCOUNTER — Ambulatory Visit (INDEPENDENT_AMBULATORY_CARE_PROVIDER_SITE_OTHER): Payer: Medicare Other | Admitting: Family Medicine

## 2015-09-03 ENCOUNTER — Encounter: Payer: Self-pay | Admitting: Family Medicine

## 2015-09-03 ENCOUNTER — Ambulatory Visit (INDEPENDENT_AMBULATORY_CARE_PROVIDER_SITE_OTHER): Payer: Medicare Other | Admitting: Behavioral Health

## 2015-09-03 VITALS — BP 126/74 | HR 82 | Temp 98.6°F | Wt 303.6 lb

## 2015-09-03 DIAGNOSIS — M5442 Lumbago with sciatica, left side: Secondary | ICD-10-CM

## 2015-09-03 DIAGNOSIS — M545 Low back pain, unspecified: Secondary | ICD-10-CM | POA: Insufficient documentation

## 2015-09-03 DIAGNOSIS — R296 Repeated falls: Secondary | ICD-10-CM

## 2015-09-03 DIAGNOSIS — M5441 Lumbago with sciatica, right side: Secondary | ICD-10-CM | POA: Diagnosis not present

## 2015-09-03 DIAGNOSIS — Z23 Encounter for immunization: Secondary | ICD-10-CM

## 2015-09-03 DIAGNOSIS — M47896 Other spondylosis, lumbar region: Secondary | ICD-10-CM | POA: Insufficient documentation

## 2015-09-03 DIAGNOSIS — M5136 Other intervertebral disc degeneration, lumbar region: Secondary | ICD-10-CM | POA: Diagnosis not present

## 2015-09-03 DIAGNOSIS — R29898 Other symptoms and signs involving the musculoskeletal system: Secondary | ICD-10-CM

## 2015-09-03 DIAGNOSIS — G5793 Unspecified mononeuropathy of bilateral lower limbs: Secondary | ICD-10-CM

## 2015-09-03 DIAGNOSIS — E1140 Type 2 diabetes mellitus with diabetic neuropathy, unspecified: Secondary | ICD-10-CM | POA: Diagnosis not present

## 2015-09-03 DIAGNOSIS — J209 Acute bronchitis, unspecified: Secondary | ICD-10-CM

## 2015-09-03 LAB — POCT URINALYSIS DIPSTICK
Bilirubin, UA: NEGATIVE
GLUCOSE UA: NEGATIVE
Leukocytes, UA: NEGATIVE
NITRITE UA: NEGATIVE
Protein, UA: NEGATIVE
RBC UA: NEGATIVE
Spec Grav, UA: >=1.03
UROBILINOGEN UA: 0.2
pH, UA: 6

## 2015-09-03 LAB — MICROALBUMIN / CREATININE URINE RATIO
CREATININE, U: 272.7 mg/dL
MICROALB UR: 1 mg/dL (ref 0.0–1.9)
Microalb Creat Ratio: 0.4 mg/g (ref 0.0–30.0)

## 2015-09-03 MED ORDER — AZITHROMYCIN 250 MG PO TABS: ORAL_TABLET | ORAL | Status: DC

## 2015-09-03 MED ORDER — GUAIFENESIN-CODEINE 100-10 MG/5ML PO SYRP: ORAL_SOLUTION | ORAL | Status: DC

## 2015-09-03 NOTE — Progress Notes (Signed)
Pre visit review using our clinic review tool, if applicable. No additional management support is needed unless otherwise documented below in the visit note. 

## 2015-09-03 NOTE — Patient Instructions (Signed)

## 2015-09-03 NOTE — Assessment & Plan Note (Signed)
With weakiness in both legs and mild back pain Xray ordered Would normally try pt or mri low back esp secondary to weakness Pt lives in [place] and took time off to be here.  She is really hoping to see specialist in next few days so she does not need to go home and come back She is struggling to find a dr in [place] that takes her insurance

## 2015-09-03 NOTE — Progress Notes (Signed)
Patient ID: [name], female    DOB: [birth year]  Age: 44 y.o. MRN: [id number]    Subjective:  Subjective HPI [name] presents for f/u and c/o falling more frequently over last month.  She does not trip -- her R leg seems to give out on her when she steps but weakness is in both legs.  + low back pain across low back b/l.  Marland Kitchen  No known injury.   Pt also c/o numbnes in toes in both feet. No numbness or pain in legs.   Pt also c/o cough -- dry.  Pt is not taking anything otc for it.      [name]  Constitutional: Negative for fever and chills.  HENT: Positive for congestion, postnasal drip, rhinorrhea and sinus pressure.   Respiratory: Positive for cough, chest tightness and shortness of breath. Negative for wheezing.   Cardiovascular: Negative for chest pain, palpitations and leg swelling.  Musculoskeletal: Positive for back pain, arthralgias and gait problem.  Allergic/Immunologic: Negative for environmental allergies.  Neurological: Positive for weakness and numbness.    History Past Medical History  Diagnosis Date  . Hypertension     during pregnancy  . Molar pregnancy   . Diabetes mellitus   . Gestational trophoblastic disease   . Morbid obesity ([place])   . Cancer ([place])     uterine  . Cardiomyopathy due to chemotherapy [facility])     She has past surgical history that includes Cesarean section; right breast biopsy; and Dilation and curettage of uterus.   Her family history includes Diabetes in her paternal grandfather; Hypertension in her father, mother, paternal grandfather, and paternal grandmother; Obesity in an other family member; Stroke in an other family member.She reports that she has never smoked. She does not have any smokeless tobacco history on file. She reports that she does not drink alcohol or use illicit drugs.  Current Outpatient Prescriptions on File Prior to Visit  Medication Sig Dispense Refill  . amLODipine (NORVASC) 5 MG tablet Take 5 mg  by mouth daily.    . carvedilol (COREG) 6.25 MG tablet Take 6.25 mg by mouth 2 (two) times daily with a meal.    . ferrous sulfate 325 (65 FE) MG tablet Take 325 mg by mouth every other day.     Marland Kitchen glucose blood (ONETOUCH VERIO) test strip Check Blood sugar twice daily Dx: E11.9 100 each 6  . Lancets (ONETOUCH ULTRASOFT) lancets Use as instructed 100 each 12  . lisinopril (PRINIVIL,ZESTRIL) 20 MG tablet Take 1 tablet (20 mg total) by mouth daily. 90 tablet 1  . lovastatin (MEVACOR) 20 MG tablet Take 1 tablet (20 mg total) by mouth at bedtime.    . metFORMIN (GLUCOPHAGE-XR) 500 MG 24 hr tablet Take 1 tablet (500 mg total) by mouth 2 (two) times daily. 180 tablet 1  . ONETOUCH DELICA LANCETS MISC 1 application by Does not apply route 2 (two) times daily. 100 each 1  . pregabalin (LYRICA) 50 MG capsule Take 1 capsule (50 mg total) by mouth 3 (three) times daily. 90 capsule 1   No current facility-administered medications on file prior to visit.     Objective:  Objective Physical Exam  Constitutional: She is oriented to person, place, and time. She appears well-developed and well-nourished.  HENT:  Head: Normocephalic and atraumatic.  Eyes: Conjunctivae and EOM are normal.  Neck: Normal range of motion. Neck supple. No JVD present. Carotid bruit is not present. No thyromegaly present.  Cardiovascular: Normal rate, regular rhythm and normal heart sounds.   No murmur heard. Pulmonary/Chest: Effort normal and breath sounds normal. No respiratory distress. She has no wheezes. She has no rales. She exhibits no tenderness.  Musculoskeletal: She exhibits no edema.  Neurological: She is alert and oriented to person, place, and time. She has normal reflexes. She displays normal reflexes.  Weakness in b/l low ext 3/5 b/l    Psychiatric: She has a normal mood and affect. Her behavior is normal.  Nursing note and vitals reviewed.  BP 126/74 mmHg  Pulse 82  Temp(Src) 98.6 F (37 C) (Oral)  Wt 303  lb 9.6 oz (137.712 kg)  SpO2 98% Wt Readings from Last 3 Encounters:  09/03/15 303 lb 9.6 oz (137.712 kg)  11/23/14 315 lb (142.883 kg)  10/12/14 313 lb 9.6 oz (142.248 kg)     Lab Results  Component Value Date   WBC 6.5 10/04/2014   HGB 12.3 10/04/2014   HCT 39.8 10/04/2014   PLT 204 10/04/2014   GLUCOSE 83 11/23/2014   CHOL 141 11/23/2014   TRIG 102.0 11/23/2014   HDL 60.50 11/23/2014   LDLCALC 60 11/23/2014   ALT 13 11/23/2014   AST 14 11/23/2014   NA 138 11/23/2014   K 3.6 11/23/2014   CL 103 11/23/2014   CREATININE 0.88 11/23/2014   BUN 15 11/23/2014   CO2 29 11/23/2014   TSH 1.93 07/14/2011   INR 1.40* 08/18/2011   HGBA1C 6.2 02/28/2015   MICROALBUR 1.0 09/03/2015    Dg Foot Complete Right  10/12/2014  CLINICAL DATA:  Stab big toe 1 week ago with persistent pain EXAM: RIGHT FOOT COMPLETE - 3+ VIEW COMPARISON:  None. FINDINGS: Tarsal metatarsal alignment is normal. No fracture is seen. Joint spaces appear normal. IMPRESSION: Negative. Electronically Signed   By: Ivar Drape M.D.   On: 10/12/2014 16:16     Assessment & Plan:  Plan I am having Ms. Divis start on azithromycin and guaiFENesin-codeine. I am also having her maintain her [facility] LANCETS, ferrous sulfate, carvedilol, amLODipine, onetouch ultrasoft, lovastatin, pregabalin, metFORMIN, glucose blood, and lisinopril.  Meds ordered this encounter  Medications  . azithromycin (ZITHROMAX Z-PAK) 250 MG tablet    Sig: a    Dispense:  6 each    Refill:  0  . guaiFENesin-codeine (ROBITUSSIN AC) 100-10 MG/5ML syrup    Sig: 1-2 tsp po qhs prn    Dispense:  120 mL    Refill:  0    Problem List Items Addressed This Visit    Weakness of lower extremity   Low back pain   Relevant Orders   DG Lumbar Spine Complete (Completed)   Falls frequently    With weakiness in both legs and mild back pain Xray ordered Would normally try pt or mri low back esp secondary to weakness Pt lives in [place] and took time  off to be here.  She is really hoping to see specialist in next few days so she does not need to go home and come back She is struggling to find a dr in [place] that takes her insurance       Other Visit Diagnoses    Neuropathy involving both lower extremities ([place])    -  Primary    Relevant Orders    Comp Met (CMET)    CBC with Differential/Platelet    Hemoglobin A1c    Lipid panel    Microalbumin / creatinine urine ratio (Completed)  POCT urinalysis dipstick (Completed)    Ambulatory referral to Neurology    Frequent falls        Relevant Orders    Comp Met (CMET)    CBC with Differential/Platelet    Hemoglobin A1c    Lipid panel    Microalbumin / creatinine urine ratio (Completed)    POCT urinalysis dipstick (Completed)    DG Lumbar Spine Complete (Completed)    Ambulatory referral to Neurology    Controlled type 2 diabetes mellitus with diabetic neuropathy, without long-term current use of insulin (H[facility])        Relevant Orders    Comp Met (CMET)    CBC with Differential/Platelet    Hemoglobin A1c    Lipid panel    Microalbumin / creatinine urine ratio (Completed)    POCT urinalysis dipstick (Completed)    Acute bronchitis, unspecified organism        Relevant Medications    azithromycin (ZITHROMAX Z-PAK) 250 MG tablet    guaiFENesin-codeine (ROBITUSSIN AC) 100-10 MG/5ML syrup       Follow-up: Return if symptoms worsen or fail to improve.  Garnet Koyanagi, DO

## 2015-09-04 ENCOUNTER — Other Ambulatory Visit: Payer: Self-pay

## 2015-09-04 DIAGNOSIS — E1140 Type 2 diabetes mellitus with diabetic neuropathy, unspecified: Secondary | ICD-10-CM | POA: Diagnosis not present

## 2015-09-04 DIAGNOSIS — Z1231 Encounter for screening mammogram for malignant neoplasm of breast: Secondary | ICD-10-CM

## 2015-09-04 LAB — LIPID PANEL
Cholesterol: 140 mg/dL (ref 0–200)
HDL: 63.6 mg/dL (ref >39.00)
LDL Cholesterol: 64 mg/dL (ref 0–99)
NONHDL: 76.08
Total CHOL/HDL Ratio: 2
Triglycerides: 59 mg/dL (ref 0.0–149.0)
VLDL: 11.8 mg/dL (ref 0.0–40.0)

## 2015-09-04 LAB — CBC WITH DIFFERENTIAL/PLATELET
BASOS ABS: 0 10*3/uL (ref 0.0–0.1)
Basophils Relative: 0.4 % (ref 0.0–3.0)
EOS PCT: 3.2 % (ref 0.0–5.0)
Eosinophils Absolute: 0.2 10*3/uL (ref 0.0–0.7)
HCT: 37.8 % (ref 36.0–46.0)
Hemoglobin: 11.7 g/dL — ABNORMAL LOW (ref 12.0–15.0)
LYMPHS ABS: 2.7 10*3/uL (ref 0.7–4.0)
Lymphocytes Relative: 43.9 % (ref 12.0–46.0)
MCHC: 30.9 g/dL (ref 30.0–36.0)
MCV: 72.9 fl — ABNORMAL LOW (ref 78.0–100.0)
Monocytes Absolute: 0.6 10*3/uL (ref 0.1–1.0)
Monocytes Relative: 9.3 % (ref 3.0–12.0)
NEUTROS ABS: 2.7 10*3/uL (ref 1.4–7.7)
NEUTROS PCT: 43.2 % (ref 43.0–77.0)
PLATELETS: 196 10*3/uL (ref 150.0–400.0)
RBC: 5.19 Mil/uL — AB (ref 3.87–5.11)
RDW: 15.7 % — ABNORMAL HIGH (ref 11.5–15.5)
WBC: 6.2 10*3/uL (ref 4.0–10.5)

## 2015-09-04 LAB — COMPREHENSIVE METABOLIC PANEL
ALK PHOS: 35 U/L — AB (ref 39–117)
ALT: 16 U/L (ref 0–35)
AST: 18 U/L (ref 0–37)
Albumin: 3.8 g/dL (ref 3.5–5.2)
BUN: 13 mg/dL (ref 6–23)
CO2: 30 mEq/L (ref 19–32)
Calcium: 9.3 mg/dL (ref 8.4–10.5)
Chloride: 103 mEq/L (ref 96–112)
Creatinine, Ser: 0.85 mg/dL (ref 0.40–1.20)
GFR: 92.53 mL/min (ref >60.00)
Glucose, Bld: 81 mg/dL (ref 70–99)
Potassium: 4.1 mEq/L (ref 3.5–5.1)
SODIUM: 140 meq/L (ref 135–145)
TOTAL PROTEIN: 7.9 g/dL (ref 6.0–8.3)
Total Bilirubin: 0.3 mg/dL (ref 0.2–1.2)

## 2015-09-04 LAB — HEMOGLOBIN A1C: HEMOGLOBIN A1C: 6.5 % (ref 4.6–6.5)

## 2015-10-15 DIAGNOSIS — G609 Hereditary and idiopathic neuropathy, unspecified: Secondary | ICD-10-CM | POA: Diagnosis not present

## 2015-10-23 ENCOUNTER — Encounter: Payer: Self-pay | Admitting: Neurology

## 2015-10-23 ENCOUNTER — Ambulatory Visit (INDEPENDENT_AMBULATORY_CARE_PROVIDER_SITE_OTHER): Payer: Medicare Other | Admitting: Neurology

## 2015-10-23 ENCOUNTER — Ambulatory Visit
Admission: RE | Admit: 2015-10-23 | Discharge: 2015-10-23 | Disposition: A | Discharge status: Home / Self Care | Payer: Medicare Other | Source: Ambulatory Visit

## 2015-10-23 ENCOUNTER — Other Ambulatory Visit (INDEPENDENT_AMBULATORY_CARE_PROVIDER_SITE_OTHER): Payer: Medicare Other

## 2015-10-23 VITALS — BP 154/96 | HR 76 | Ht 69.0 in | Wt 313.4 lb

## 2015-10-23 DIAGNOSIS — Z1231 Encounter for screening mammogram for malignant neoplasm of breast: Secondary | ICD-10-CM | POA: Diagnosis not present

## 2015-10-23 DIAGNOSIS — G609 Hereditary and idiopathic neuropathy, unspecified: Secondary | ICD-10-CM

## 2015-10-23 DIAGNOSIS — W19XXXA Unspecified fall, initial encounter: Secondary | ICD-10-CM

## 2015-10-23 DIAGNOSIS — I10 Essential (primary) hypertension: Secondary | ICD-10-CM

## 2015-10-23 DIAGNOSIS — E1142 Type 2 diabetes mellitus with diabetic polyneuropathy: Secondary | ICD-10-CM

## 2015-10-23 DIAGNOSIS — M25561 Pain in right knee: Secondary | ICD-10-CM

## 2015-10-23 DIAGNOSIS — M25562 Pain in left knee: Secondary | ICD-10-CM

## 2015-10-23 LAB — VITAMIN B12: Vitamin B-12: 437 pg/mL (ref 211–911)

## 2015-10-23 LAB — SEDIMENTATION RATE: Sed Rate: 32 mm/hr — ABNORMAL HIGH (ref 0–22)

## 2015-10-23 NOTE — Patient Instructions (Signed)
Falls likely related to neuropathy and knee problems 1.  Will look for other causes of neuropathy.  Will check ANA, Sed Rate, B12, SPEP/IFE, B6 2.  Use cane.  Turn light on when you get up at night. 3.  For nerve pain/discomfort, will start gabapentin 300mg .  Take 1 capsule at bedtime for 7 days, then 1 capsule in AM and 1 capsule at bedtime for 7 days, then 1 capsule 3 times daily. 4.  Will refer you for physical therapy for walking and balance 5.  For knee pain, discuss with PCP and work on weight loss 6.  Follow up in 3 months.

## 2015-10-23 NOTE — Progress Notes (Signed)
NEUROLOGY CONSULTATION NOTE  [name] MRN: [id number] DOB: [birth year]  Referring provider: Dr. Etter [name] Primary care provider: Dr. Etter [name]  Reason for consult:  Neuropathy, falls  HISTORY OF PRESENT ILLNESS: [name] is a 44 year old right-handed female with chemotherapy-induced diabetes, morbid obesity, hypertension, hyperlipidemia, cardiomyopathy due to chemotherapy, and history of uterine cancer, who presents for neuropathy.  Imaging of lumbar Xray and labs reviewed.  She underwent 6 cycles of EMACO for uterine cancer in 2014.  During that time, she developed neuropathy in the fingers and feet.  She describes decreased sensation in the toes, fingers and ball of her feet.  She also reports burning and itching sensation.  Her toes feel cold.  It is constant.  After she finished chemotherapy, the symptoms persisted, although they have not gotten worse.  She reports persistent falls, however.  It usually occurs at night, when she walks in the dark from bed to the bathroom.  However, she also had falls walking down the stairs.  She sometimes uses a cane.  She denies headache or neck pain.  She has localized low back pain but no radicular pain down the legs.  She reports pain in her knees, particularly on the right.  She denies dizziness.  Due to chemotherapy, she also developed diabetes.  A recent Hgb A1c of 6.5%.  X-ray of lumbar spine from last month showed mild multilevel degenerative changes.  PAST MEDICAL HISTORY: Past Medical History  Diagnosis Date  . Hypertension     during pregnancy  . Molar pregnancy   . Diabetes mellitus   . Gestational trophoblastic disease   . Morbid obesity ([place])   . Cancer ([place])     uterine  . Cardiomyopathy due to chemotherapy [facility])     PAST SURGICAL HISTORY: Past Surgical History  Procedure Laterality Date  . Cesarean section      x2  . Right breast biopsy      x2  . Dilation and curettage of uterus      MEDICATIONS: Current  Outpatient Prescriptions on File Prior to Visit  Medication Sig Dispense Refill  . amLODipine (NORVASC) 5 MG tablet Take 5 mg by mouth daily.    . carvedilol (COREG) 6.25 MG tablet Take 6.25 mg by mouth 2 (two) times daily with a meal.    . ferrous sulfate 325 (65 FE) MG tablet Take 325 mg by mouth every other day.     Marland Kitchen glucose blood (ONETOUCH VERIO) test strip Check Blood sugar twice daily Dx: E11.9 100 each 6  . Lancets (ONETOUCH ULTRASOFT) lancets Use as instructed 100 each 12  . metFORMIN (GLUCOPHAGE-XR) 500 MG 24 hr tablet Take 1 tablet (500 mg total) by mouth 2 (two) times daily. 180 tablet 1  . ONETOUCH DELICA LANCETS MISC 1 application by Does not apply route 2 (two) times daily. 100 each 1  . lovastatin (MEVACOR) 20 MG tablet Take 1 tablet (20 mg total) by mouth at bedtime. (Patient not taking: Reported on 10/23/2015)    . pregabalin (LYRICA) 50 MG capsule Take 1 capsule (50 mg total) by mouth 3 (three) times daily. (Patient not taking: Reported on 10/23/2015) 90 capsule 1   No current facility-administered medications on file prior to visit.    ALLERGIES: Allergies  Allergen Reactions  . Sulfonamide Derivatives     REACTION: unspecified  . Sulfur Hives    FAMILY HISTORY: Family History  Problem Relation Age of Onset  . Hypertension Mother   . Diabetes Paternal  Grandfather   . Stroke      aunts and uncles on mother side  . Obesity    . Hypertension Father   . Hypertension Paternal Grandfather   . Hypertension Paternal Grandmother     SOCIAL HISTORY: Social History   Social History  . Marital Status: Legally Separated    Spouse Name: N/A  . Number of Children: 1  . 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 Smoking status: Never Smoker   . Smokeless tobacco: Not on file  . Alcohol Use: No  . Drug Use: No  . Sexual Activity: Yes   Other Topics Concern  . Not on file   Social History  Narrative   Pt has masters degree    [name]: Constitutional: No fevers, chills, or sweats, no generalized fatigue, change in appetite Eyes: No visual changes, double vision, eye pain Ear, nose and throat: No hearing loss, ear pain, nasal congestion, sore throat Cardiovascular: No chest pain, palpitations Respiratory:  No shortness of breath at rest or with exertion, wheezes GastrointestinaI: No nausea, vomiting, diarrhea, abdominal pain, fecal incontinence Genitourinary:  urinary frequency Musculoskeletal:  back pain Integumentary: No rash, pruritus, skin lesions Neurological: as above Psychiatric: No depression, insomnia, anxiety Endocrine: No palpitations, fatigue, diaphoresis, mood swings, change in appetite, change in weight, increased thirst Hematologic/Lymphatic:  No anemia, purpura, petechiae. Allergic/Immunologic: no itchy/runny eyes, nasal congestion, recent allergic reactions, rashes  PHYSICAL EXAM: Filed Vitals:   10/23/15 0756  BP: 154/96  Pulse: 76   General: No acute distress.  Patient appears well-groomed.  Morbidly obese Head:  Normocephalic/atraumatic Eyes:  fundi unremarkable, without vessel changes, exudates, hemorrhages or papilledema. Neck: supple, no paraspinal tenderness, full range of motion Back: bilateral paraspinal tenderness Heart: regular rate and rhythm Lungs: Clear to auscultation bilaterally. Vascular: No carotid bruits. Neurological Exam: Mental status: alert and oriented to person, place, and time, recent and remote memory intact, fund of knowledge intact, attention and concentration intact, speech fluent and not dysarthric, language intact. Cranial nerves: CN I: not tested CN II: pupils equal, round and reactive to light, visual fields intact, fundi unremarkable, without vessel changes, exudates, hemorrhages or papilledema. CN III, IV, VI:  full range of motion, no nystagmus, no ptosis CN V: facial sensation intact CN VII: upper  and lower face symmetric CN VIII: hearing intact CN IX, X: gag intact, uvula midline CN XI: sternocleidomastoid and trapezius muscles intact CN XII: tongue midline Bulk & Tone: normal, no fasciculations. Motor:  Some give-away weakness in quads and hamstrings (limited due to knee pain) but overall, 5/5 throughout with full effort. Sensation:  Decreased pinprick sensation in feet from toes up to mid feet.  Decreased vibration sensation in feet. Deep Tendon Reflexes: absent throughout, toes downgoing.  Finger to nose testing:  Without dysmetria.   Heel to shin:  Without dysmetria.   Gait:  Wide-based stance.  Does not easily pick up feet.  Able to turn and tandem walk. Romberg negative.  IMPRESSION: Peripheral neuropathy.  Usually, improves within a few months following discontinuation of chemotherapy.  Chemotherapy-induced diabetes may play a role as well. Falls.  Likely related to neuropathy and possible arthritis in knees.  Do not suspect intracranial or spinal pathology. Morbid obesity HTN  PLAN: 1.  Will check for other potential causes of neuropathy (ANA, Sed Rate, B12, B6, SPEP/IFE) 2.  For neuropathic pain, will start gabapentin, titrating to 300mg   three times daily 3.  PT for gait.  Advised to turn light out at night before getting out of bed to go to bathroom 4.  Weight loss 5.  Discuss evaluation for arthritis in knees with PCP 6.  Recheck BP with PCP 7.  Follow up in 3 months.  Thank you for allowing me to take part in the care of this patient.  Metta Clines, DO  CC:  Garnet Koyanagi, DO

## 2015-10-25 LAB — SPEP & IFE WITH QIG
Albumin ELP: 3.6 g/dL — ABNORMAL LOW (ref 3.8–4.8)
Alpha-1-Globulin: 0.3 g/dL (ref 0.2–0.3)
Alpha-2-Globulin: 0.8 g/dL (ref 0.5–0.9)
Beta 2: 0.5 g/dL (ref 0.2–0.5)
Beta Globulin: 0.5 g/dL (ref 0.4–0.6)
Gamma Globulin: 1.8 g/dL — ABNORMAL HIGH (ref 0.8–1.7)
IGA: 331 mg/dL (ref 69–380)
IGG (IMMUNOGLOBIN G), SERUM: 2040 mg/dL — AB (ref 690–1700)
IgM, Serum: 51 mg/dL — ABNORMAL LOW (ref 52–322)
TOTAL PROTEIN, SERUM ELECTROPHOR: 7.4 g/dL (ref 6.1–8.1)

## 2015-10-25 LAB — ANA: Anti Nuclear Antibody(ANA): NEGATIVE

## 2015-10-30 LAB — VITAMIN B6: VITAMIN B6: 6.9 ng/mL (ref 2.1–21.7)

## 2015-10-31 ENCOUNTER — Telehealth: Payer: Self-pay

## 2015-10-31 NOTE — Telephone Encounter (Signed)
-----   Message from Pieter Partridge, DO sent at 10/31/2015  7:22 AM EST ----- Labs do not reveal any significant or revealing abnormalities that would suggest another specific cause for neuropathy.

## 2015-10-31 NOTE — Telephone Encounter (Signed)
Called pt at home number. No answer, No answering machine.

## 2015-11-21 DIAGNOSIS — G629 Polyneuropathy, unspecified: Secondary | ICD-10-CM | POA: Diagnosis not present

## 2015-11-21 DIAGNOSIS — I429 Cardiomyopathy, unspecified: Secondary | ICD-10-CM | POA: Diagnosis not present

## 2015-12-13 ENCOUNTER — Telehealth: Payer: Self-pay

## 2015-12-13 NOTE — Telephone Encounter (Signed)
Left patient a message to call back to schedule AWV or CPE

## 2016-01-25 ENCOUNTER — Ambulatory Visit: Payer: Medicare Other | Admitting: Neurology

## 2016-04-18 ENCOUNTER — Telehealth: Payer: Self-pay | Admitting: Neurology

## 2016-04-18 ENCOUNTER — Ambulatory Visit (INDEPENDENT_AMBULATORY_CARE_PROVIDER_SITE_OTHER): Payer: Medicare Other | Admitting: Neurology

## 2016-04-18 ENCOUNTER — Encounter: Payer: Self-pay | Admitting: Neurology

## 2016-04-18 VITALS — BP 112/74 | HR 82 | Ht 69.0 in | Wt 307.9 lb

## 2016-04-18 DIAGNOSIS — E1142 Type 2 diabetes mellitus with diabetic polyneuropathy: Secondary | ICD-10-CM

## 2016-04-18 DIAGNOSIS — G609 Hereditary and idiopathic neuropathy, unspecified: Secondary | ICD-10-CM | POA: Diagnosis not present

## 2016-04-18 DIAGNOSIS — R296 Repeated falls: Secondary | ICD-10-CM

## 2016-04-18 MED ORDER — GABAPENTIN 300 MG PO CAPS: 300.0000 mg | ORAL_CAPSULE | Freq: Three times a day (TID) | ORAL | Status: DC

## 2016-04-18 NOTE — Telephone Encounter (Signed)
[name] 01/28/1971 she called wanting to let you know that she picked up her prescription at [facility] and that you could cancel the one at [facility]. Thank you

## 2016-04-18 NOTE — Telephone Encounter (Signed)
Called and cancelled [facility].

## 2016-04-18 NOTE — Patient Instructions (Addendum)
Neuropathy likely related to diabetes  1.  Start gabapentin 300mg .   Take 1 cap at bedtime for 7 days,  Then 1 cap twice daily for 7 days,  Then 1 cap 3 times daily  2.  If neuropathy gets worse, we can get a nerve study 3.  Use cane at all times (even to the bathroom at night) 4.  Follow up in 6 months. Weight loss

## 2016-04-18 NOTE — Progress Notes (Signed)
NEUROLOGY FOLLOW UP OFFICE NOTE  [name] DD:[id number]  HISTORY OF PRESENT ILLNESS: [name] is a 45 year old right-handed female with chemotherapy-induced diabetes, morbid obesity, hypertension, hyperlipidemia, cardiomyopathy due to chemotherapy, and history of uterine cancer, who follows up for neuropathy.  UPDATE: Neuropathy labs were performed and were unremarkable.  ANA was negative, Sed Rate 32, B12 437, B6 6.9.  SPEP showed nonspecific findings of possible faint restricted band in the gamma region.  IFE showed slightly restricted mobility in the IgG and Kappa lanes but no monoclonal gammopathy.  For pain, she was started on gabapentin but didn't start it.  Neuropathy is unchanged.  She falls twice a month (last time a week ago) when she gets up at night to go to the bathroom because she cannot feel the ground.  She has a light on.  She uses a cane during the day but not at night.  HISTORY: She underwent 6 cycles of EMACO for uterine cancer in 2014.  During that time, she developed neuropathy in the fingers and feet.  She describes decreased sensation in the toes, fingers and ball of her feet.  She also reports burning and itching sensation.  Her toes feel cold.  It is constant.  After she finished chemotherapy, the symptoms persisted, although they have not gotten worse.  She reports persistent falls, however.  It usually occurs at night, when she walks in the dark from bed to the bathroom.  However, she also had falls walking down the stairs.  She sometimes uses a cane.  She denies headache or neck pain.  She has localized low back pain but no radicular pain down the legs.  She reports pain in her knees, particularly on the right.  She denies dizziness.  Due to chemotherapy, she also developed diabetes.  A recent Hgb A1c of 6.5%.  X-ray of lumbar spine from last month showed mild multilevel degenerative changes.  PAST MEDICAL HISTORY: Past Medical History  Diagnosis Date  .  Hypertension     during pregnancy  . Molar pregnancy   . Diabetes mellitus   . Gestational trophoblastic disease   . Morbid obesity ([place])   . Cancer ([place])     uterine  . Cardiomyopathy due to chemotherapy [facility])     MEDICATIONS: Current Outpatient Prescriptions on File Prior to Visit  Medication Sig Dispense Refill  . amLODipine (NORVASC) 5 MG tablet Take 5 mg by mouth daily.    . carvedilol (COREG) 6.25 MG tablet Take 6.25 mg by mouth 2 (two) times daily with a meal.    . ferrous sulfate 325 (65 FE) MG tablet Take 325 mg by mouth every other day.     Marland Kitchen glucose blood (ONETOUCH VERIO) test strip Check Blood sugar twice daily Dx: E11.9 100 each 6  . Lancets (ONETOUCH ULTRASOFT) lancets Use as instructed 100 each 12  . lovastatin (MEVACOR) 20 MG tablet Take 1 tablet (20 mg total) by mouth at bedtime.    . metFORMIN (GLUCOPHAGE-XR) 500 MG 24 hr tablet Take 1 tablet (500 mg total) by mouth 2 (two) times daily. 180 tablet 1  . ONETOUCH DELICA LANCETS MISC 1 application by Does not apply route 2 (two) times daily. 100 each 1  . pregabalin (LYRICA) 50 MG capsule Take 1 capsule (50 mg total) by mouth 3 (three) times daily. (Patient not taking: Reported on 10/23/2015) 90 capsule 1   No current facility-administered medications on file prior to visit.    ALLERGIES: Allergies  Allergen Reactions  . Sulfonamide Derivatives     REACTION: unspecified  . Sulfur Hives    FAMILY HISTORY: Family History  Problem Relation Age of Onset  . Hypertension Mother   . Diabetes Paternal Grandfather   . Stroke      aunts and uncles on mother side  . Obesity    . Hypertension Father   . Hypertension Paternal Grandfather   . Hypertension Paternal Grandmother     SOCIAL HISTORY: Social History   Social History  . Marital Status: Legally Separated    Spouse Name: N/A  . Number of Children: 1  . 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 Smoking status: Never Smoker   . Smokeless tobacco: Not on file  . Alcohol Use: No  . Drug Use: No  . Sexual Activity: Yes   Other Topics Concern  . Not on file   Social History Narrative   Pt has masters degree    [name]: Constitutional: No fevers, chills, or sweats, no generalized fatigue, change in appetite Eyes: No visual changes, double vision, eye pain Ear, nose and throat: No hearing loss, ear pain, nasal congestion, sore throat Cardiovascular: No chest pain, palpitations Respiratory:  No shortness of breath at rest or with exertion, wheezes GastrointestinaI: No nausea, vomiting, diarrhea, abdominal pain, fecal incontinence Genitourinary:  No dysuria, urinary retention or frequency Musculoskeletal:  No neck pain, back pain Integumentary: No rash, pruritus, skin lesions Neurological: as above Psychiatric: No depression, insomnia, anxiety Endocrine: No palpitations, fatigue, diaphoresis, mood swings, change in appetite, change in weight, increased thirst Hematologic/Lymphatic:  No purpura, petechiae. Allergic/Immunologic: no itchy/runny eyes, nasal congestion, recent allergic reactions, rashes  PHYSICAL EXAM: Filed Vitals:   04/18/16 0856  BP: 112/74  Pulse: 82   General: No acute distress.  Patient appears well-groomed.  Morbidly obese body habitus. Head:  Normocephalic/atraumatic Eyes:  Fundi examined but not visualized Neck: supple, no paraspinal tenderness, full range of motion Heart:  Regular rate and rhythm Lungs:  Clear to auscultation bilaterally Back: No paraspinal tenderness Neurological Exam: alert and oriented to person, place, and time. Attention span and concentration intact, recent and remote memory intact, fund of knowledge intact.  Speech fluent and not dysarthric, language intact.  CN II-XII intact. Bulk and tone normal, muscle strength 5/5 throughout.  Sensation to pinprick reduced on bottom/soles of feet, reduced vibration  up to ankles.  Deep tendon reflexes 1+ upper extremities except absent in knees and ankles, toes downgoing.  Finger to nose testing intact.  Wide-based gait.  Cannot tandem walk.  Romberg with sway  IMPRESSION: Peripheral neuropathy.  Usually, improves within a few months following discontinuation of chemotherapy.  Chemotherapy-induced diabetes may play a role as well. Falls.  Likely related to neuropathy and possible arthritis in knees.  Do not suspect intracranial or spinal pathology. Morbid obesity  PLAN: Gabapentin titrating to 300mg  three times daily for pain Use cane at all times, even to the bathroom at night Weight loss Follow up in 6 months.  15 minutes spent face to face with patient, over 50% spent discussing management and diagnosis.  Metta Clines, DO  CC:  Garnet Koyanagi, DO

## 2016-04-21 DIAGNOSIS — G629 Polyneuropathy, unspecified: Secondary | ICD-10-CM | POA: Diagnosis not present

## 2016-04-21 DIAGNOSIS — I429 Cardiomyopathy, unspecified: Secondary | ICD-10-CM | POA: Diagnosis not present

## 2016-05-02 DIAGNOSIS — I10 Essential (primary) hypertension: Secondary | ICD-10-CM | POA: Diagnosis not present

## 2016-05-02 DIAGNOSIS — E119 Type 2 diabetes mellitus without complications: Secondary | ICD-10-CM | POA: Diagnosis not present

## 2016-05-02 DIAGNOSIS — M79642 Pain in left hand: Secondary | ICD-10-CM | POA: Diagnosis not present

## 2016-05-02 DIAGNOSIS — I429 Cardiomyopathy, unspecified: Secondary | ICD-10-CM | POA: Diagnosis not present

## 2016-05-02 DIAGNOSIS — Z888 Allergy status to other drugs, medicaments and biological substances status: Secondary | ICD-10-CM | POA: Diagnosis not present

## 2016-05-02 DIAGNOSIS — S60222A Contusion of left hand, initial encounter: Secondary | ICD-10-CM | POA: Diagnosis not present

## 2016-05-02 DIAGNOSIS — Z8542 Personal history of malignant neoplasm of other parts of uterus: Secondary | ICD-10-CM | POA: Diagnosis not present

## 2016-05-02 DIAGNOSIS — G629 Polyneuropathy, unspecified: Secondary | ICD-10-CM | POA: Diagnosis not present

## 2016-05-23 ENCOUNTER — Ambulatory Visit: Payer: Medicare Other | Admitting: Family Medicine

## 2016-05-27 ENCOUNTER — Ambulatory Visit (INDEPENDENT_AMBULATORY_CARE_PROVIDER_SITE_OTHER): Payer: Medicare Other | Admitting: Family Medicine

## 2016-05-27 ENCOUNTER — Encounter: Payer: Self-pay | Admitting: Family Medicine

## 2016-05-27 VITALS — BP 136/84 | HR 83 | Temp 98.0°F | Ht 69.0 in | Wt 317.6 lb

## 2016-05-27 DIAGNOSIS — E1051 Type 1 diabetes mellitus with diabetic peripheral angiopathy without gangrene: Secondary | ICD-10-CM | POA: Diagnosis not present

## 2016-05-27 DIAGNOSIS — IMO0002 Reserved for concepts with insufficient information to code with codable children: Secondary | ICD-10-CM

## 2016-05-27 DIAGNOSIS — M6530 Trigger finger, unspecified finger: Secondary | ICD-10-CM

## 2016-05-27 DIAGNOSIS — Z01419 Encounter for gynecological examination (general) (routine) without abnormal findings: Secondary | ICD-10-CM | POA: Diagnosis not present

## 2016-05-27 DIAGNOSIS — E1065 Type 1 diabetes mellitus with hyperglycemia: Secondary | ICD-10-CM | POA: Diagnosis not present

## 2016-05-27 DIAGNOSIS — N912 Amenorrhea, unspecified: Secondary | ICD-10-CM | POA: Diagnosis not present

## 2016-05-27 DIAGNOSIS — E785 Hyperlipidemia, unspecified: Secondary | ICD-10-CM | POA: Diagnosis not present

## 2016-05-27 DIAGNOSIS — I10 Essential (primary) hypertension: Secondary | ICD-10-CM

## 2016-05-27 DIAGNOSIS — R300 Dysuria: Secondary | ICD-10-CM | POA: Diagnosis not present

## 2016-05-27 LAB — COMPREHENSIVE METABOLIC PANEL
ALT: 14 U/L (ref 0–35)
AST: 15 U/L (ref 0–37)
Albumin: 3.8 g/dL (ref 3.5–5.2)
Alkaline Phosphatase: 47 U/L (ref 39–117)
BILIRUBIN TOTAL: 0.3 mg/dL (ref 0.2–1.2)
BUN: 14 mg/dL (ref 6–23)
CALCIUM: 9.4 mg/dL (ref 8.4–10.5)
CO2: 30 meq/L (ref 19–32)
CREATININE: 0.73 mg/dL (ref 0.40–1.20)
Chloride: 101 mEq/L (ref 96–112)
GFR: 109.94 mL/min (ref >60.00)
Glucose, Bld: 123 mg/dL — ABNORMAL HIGH (ref 70–99)
Potassium: 3.7 mEq/L (ref 3.5–5.1)
Sodium: 138 mEq/L (ref 135–145)
Total Protein: 8.1 g/dL (ref 6.0–8.3)

## 2016-05-27 LAB — POCT URINALYSIS DIPSTICK
Bilirubin, UA: NEGATIVE
Glucose, UA: NEGATIVE
Ketones, UA: NEGATIVE
Leukocytes, UA: NEGATIVE
Nitrite, UA: NEGATIVE
PH UA: 6
PROTEIN UA: NEGATIVE
RBC UA: NEGATIVE
SPEC GRAV UA: 1.025
UROBILINOGEN UA: 0.2

## 2016-05-27 LAB — LIPID PANEL
CHOL/HDL RATIO: 3
CHOLESTEROL: 173 mg/dL (ref 0–200)
HDL: 61.9 mg/dL (ref >39.00)
LDL CALC: 87 mg/dL (ref 0–99)
NONHDL: 111.41
Triglycerides: 122 mg/dL (ref 0.0–149.0)
VLDL: 24.4 mg/dL (ref 0.0–40.0)

## 2016-05-27 LAB — HEMOGLOBIN A1C: Hgb A1c MFr Bld: 6.6 % — ABNORMAL HIGH (ref 4.6–6.5)

## 2016-05-27 NOTE — Patient Instructions (Signed)

## 2016-05-27 NOTE — Progress Notes (Signed)
Patient ID: [name], female    DOB: Apr 20, 1971  Age: 45 y.o. MRN: [id number]    Subjective:  Subjective  HPI Tyra L Paskett presents for f/u dm and bp.  No complaints.    Pt c/o trigger finger both hands and would like a referral  HPI HYPERTENSION   Blood pressure range-not checking   Chest pain- no      Dyspnea- no Lightheadedness- no   Edema- no  Other side effects - no   Medication compliance: good Low salt diet- yes    DIABETES    Blood Sugar ranges---  120s   Polyuria- no New Visual problems-  No Other side effects-no Medication compliance good Last eye exam- 11/2015 Foot exam- today    HYPERLIPIDEMIA  Medication compliance- good RUQ pain- no  Muscle aches- no Other side effects-no  [name]  Constitutional: Negative for appetite change, diaphoresis, fatigue and unexpected weight change.  Eyes: Negative for pain, redness and visual disturbance.  Respiratory: Negative for cough, chest tightness, shortness of breath and wheezing.   Cardiovascular: Negative for chest pain, palpitations and leg swelling.  Endocrine: Negative for cold intolerance, heat intolerance, polydipsia, polyphagia and polyuria.  Genitourinary: Negative for difficulty urinating, dysuria and frequency.  Neurological: Negative for dizziness, light-headedness, numbness and headaches.    History Past Medical History:  Diagnosis Date  . Cancer ([place])    uterine  . Cardiomyopathy due to chemotherapy ([place])   . Diabetes mellitus   . Gestational trophoblastic disease   . Hypertension    during pregnancy  . Molar pregnancy   . Morbid obesity ([place])     She has a past surgical history that includes Cesarean section; right breast biopsy; and Dilation and curettage of uterus.   Her family history includes Diabetes in her paternal grandfather; Hypertension in her father, mother, paternal grandfather, and paternal grandmother.She reports that she has never smoked. She does not have any  smokeless tobacco history on file. She reports that she does not drink alcohol or use drugs.  Current Outpatient Prescriptions on File Prior to Visit  Medication Sig Dispense Refill  . amLODipine (NORVASC) 5 MG tablet Take 5 mg by mouth daily.    . carvedilol (COREG) 6.25 MG tablet Take 6.25 mg by mouth 2 (two) times daily with a meal.    . ferrous sulfate 325 (65 FE) MG tablet Take 325 mg by mouth every other day.     . gabapentin (NEURONTIN) 300 MG capsule Take 1 capsule (300 mg total) by mouth 3 (three) times daily. 90 capsule 5  . glucose blood (ONETOUCH VERIO) test strip Check Blood sugar twice daily Dx: E11.9 100 each 6  . Lancets (ONETOUCH ULTRASOFT) lancets Use as instructed 100 each 12  . metFORMIN (GLUCOPHAGE-XR) 500 MG 24 hr tablet Take 1 tablet (500 mg total) by mouth 2 (two) times daily. 180 tablet 1  . ONETOUCH DELICA LANCETS MISC 1 application by Does not apply route 2 (two) times daily. 100 each 1  . pregabalin (LYRICA) 50 MG capsule Take 1 capsule (50 mg total) by mouth 3 (three) times daily. 90 capsule 1   No current facility-administered medications on file prior to visit.      Objective:  Objective  Physical Exam  Constitutional: She is oriented to person, place, and time. She appears well-developed and well-nourished.  HENT:  Head: Normocephalic and atraumatic.  Eyes: Conjunctivae and EOM are normal.  Neck: Normal range of motion. Neck supple. No JVD present.  Carotid bruit is not present. No thyromegaly present.  Cardiovascular: Normal rate, regular rhythm and normal heart sounds.   No murmur heard. Pulmonary/Chest: Effort normal and breath sounds normal. No respiratory distress. She has no wheezes. She has no rales. She exhibits no tenderness.  Musculoskeletal: She exhibits no edema.  Neurological: She is alert and oriented to person, place, and time.  Psychiatric: She has a normal mood and affect. Her behavior is normal. Judgment and thought content normal.    Nursing note and vitals reviewed. Sensory exam of the foot is abnormal, tested with the monofilament. Good pulses, no lesions or ulcers, good peripheral pulses.  Pt can feel top of feet but not bottom  BP 136/84 (BP Location: Right Arm, Patient Position: Sitting, Cuff Size: Large)   Pulse 83   Temp 98 F (36.7 C) (Oral)   Ht 5\' 9"  (1.753 m)   Wt (!) 317 lb 9.6 oz (144.1 kg)   SpO2 98%   BMI 46.90 kg/m  Wt Readings from Last 3 Encounters:  05/27/16 (!) 317 lb 9.6 oz (144.1 kg)  04/18/16 (!) 307 lb 14.4 oz (139.7 kg)  10/23/15 (!) 313 lb 6.4 oz (142.2 kg)     Lab Results  Component Value Date   WBC 6.2 09/03/2015   HGB 11.7 (L) 09/03/2015   HCT 37.8 09/03/2015   PLT 196.0 09/03/2015   GLUCOSE 81 09/03/2015   CHOL 140 09/03/2015   TRIG 59.0 09/03/2015   HDL 63.60 09/03/2015   LDLCALC 64 09/03/2015   ALT 16 09/03/2015   AST 18 09/03/2015   NA 140 09/03/2015   K 4.1 09/03/2015   CL 103 09/03/2015   CREATININE 0.85 09/03/2015   BUN 13 09/03/2015   CO2 30 09/03/2015   TSH 1.93 07/14/2011   INR 1.40 (L) 08/18/2011   HGBA1C 6.5 09/03/2015   MICROALBUR 1.0 09/03/2015    Mm Screening Breast Tomo Bilateral  Result Date: 10/23/2015 CLINICAL DATA:  Screening. EXAM: DIGITAL SCREENING BILATERAL MAMMOGRAM WITH 3D TOMO WITH CAD COMPARISON:  Previous exam(s). ACR Breast Density Category c: The breast tissue is heterogeneously dense, which may obscure small masses. FINDINGS: There are no findings suspicious for malignancy. Images were processed with CAD. IMPRESSION: No mammographic evidence of malignancy. A result letter of this screening mammogram will be mailed directly to the patient. RECOMMENDATION: Screening mammogram in one year. (Code:SM-B-01Y) BI-RADS CATEGORY  1: Negative. Electronically Signed   By: Lajean Manes M.D.   On: 10/23/2015 13:35     Assessment & Plan:  Plan  I have discontinued Ms. Vea's lovastatin and amLODipine-benazepril. I am also having her maintain  her [facility] LANCETS, ferrous sulfate, carvedilol, amLODipine, onetouch ultrasoft, pregabalin, metFORMIN, glucose blood, gabapentin, simvastatin, and valsartan.  Meds ordered this encounter  Medications  . simvastatin (ZOCOR) 10 MG tablet    Sig: Take 10 mg by mouth daily.  . valsartan (DIOVAN) 320 MG tablet    Sig: Take 320 mg by mouth daily.    Problem List Items Addressed This Visit      Unprioritized   Essential hypertension    Stable  con't meds      Relevant Medications   simvastatin (ZOCOR) 10 MG tablet   valsartan (DIOVAN) 320 MG tablet   Other Relevant Orders   Comprehensive metabolic panel   Hemoglobin A1c   Lipid panel   POCT urinalysis dipstick    Other Visit Diagnoses    Type 1 diabetes, uncontrolled, with peripheral circulatory disorder ([place])    -  Primary   Relevant Medications   simvastatin (ZOCOR) 10 MG tablet   valsartan (DIOVAN) 320 MG tablet   Other Relevant Orders   Comprehensive metabolic panel   Hemoglobin A1c   Lipid panel   POCT urinalysis dipstick   Hyperlipidemia       Relevant Medications   simvastatin (ZOCOR) 10 MG tablet   valsartan (DIOVAN) 320 MG tablet   Other Relevant Orders   Comprehensive metabolic panel   Hemoglobin A1c   Lipid panel   POCT urinalysis dipstick   Trigger finger of both hands       Relevant Orders   Ambulatory referral to Hand Surgery      Follow-up: Return in about 6 months (around 11/27/2016) for annual exam, fasting.  Ann Held, DO

## 2016-05-27 NOTE — Progress Notes (Signed)
Pre visit review using our clinic review tool, if applicable. No additional management support is needed unless otherwise documented below in the visit note. 

## 2016-05-27 NOTE — Assessment & Plan Note (Signed)
Stable con't meds 

## 2016-06-12 DIAGNOSIS — G5601 Carpal tunnel syndrome, right upper limb: Secondary | ICD-10-CM | POA: Diagnosis not present

## 2016-06-12 DIAGNOSIS — I420 Dilated cardiomyopathy: Secondary | ICD-10-CM | POA: Diagnosis not present

## 2016-06-12 DIAGNOSIS — G5602 Carpal tunnel syndrome, left upper limb: Secondary | ICD-10-CM | POA: Diagnosis not present

## 2016-06-12 DIAGNOSIS — I429 Cardiomyopathy, unspecified: Secondary | ICD-10-CM | POA: Diagnosis not present

## 2016-06-12 DIAGNOSIS — R5383 Other fatigue: Secondary | ICD-10-CM | POA: Diagnosis not present

## 2016-06-12 DIAGNOSIS — E119 Type 2 diabetes mellitus without complications: Secondary | ICD-10-CM | POA: Diagnosis not present

## 2016-07-13 DIAGNOSIS — E119 Type 2 diabetes mellitus without complications: Secondary | ICD-10-CM | POA: Diagnosis not present

## 2016-07-13 DIAGNOSIS — I509 Heart failure, unspecified: Secondary | ICD-10-CM | POA: Diagnosis not present

## 2016-07-13 DIAGNOSIS — J020 Streptococcal pharyngitis: Secondary | ICD-10-CM | POA: Diagnosis not present

## 2016-07-13 DIAGNOSIS — Z7984 Long term (current) use of oral hypoglycemic drugs: Secondary | ICD-10-CM | POA: Diagnosis not present

## 2016-07-13 DIAGNOSIS — I429 Cardiomyopathy, unspecified: Secondary | ICD-10-CM | POA: Diagnosis not present

## 2016-07-13 DIAGNOSIS — I110 Hypertensive heart disease with heart failure: Secondary | ICD-10-CM | POA: Diagnosis not present

## 2016-07-28 ENCOUNTER — Encounter: Payer: Self-pay | Admitting: Neurology

## 2016-07-28 ENCOUNTER — Ambulatory Visit (INDEPENDENT_AMBULATORY_CARE_PROVIDER_SITE_OTHER): Payer: Medicare Other | Admitting: Neurology

## 2016-07-28 ENCOUNTER — Other Ambulatory Visit: Payer: Self-pay | Admitting: Family Medicine

## 2016-07-28 VITALS — BP 136/82 | HR 86 | Ht 69.0 in | Wt 314.0 lb

## 2016-07-28 DIAGNOSIS — Z1231 Encounter for screening mammogram for malignant neoplasm of breast: Secondary | ICD-10-CM

## 2016-07-28 DIAGNOSIS — G609 Hereditary and idiopathic neuropathy, unspecified: Secondary | ICD-10-CM

## 2016-07-28 DIAGNOSIS — R55 Syncope and collapse: Secondary | ICD-10-CM | POA: Diagnosis not present

## 2016-07-28 DIAGNOSIS — G629 Polyneuropathy, unspecified: Secondary | ICD-10-CM | POA: Diagnosis not present

## 2016-07-28 DIAGNOSIS — I429 Cardiomyopathy, unspecified: Secondary | ICD-10-CM | POA: Diagnosis not present

## 2016-07-28 MED ORDER — GABAPENTIN 300 MG PO CAPS: 600.0000 mg | ORAL_CAPSULE | Freq: Three times a day (TID) | ORAL | 5 refills | Status: AC

## 2016-07-28 NOTE — Patient Instructions (Signed)
1.  We will increase gabapentin to 600mg  three times daily to help with pain 2.  We will check a nerve study to evaluate type and severity of neuropathy 3.  Follow up in 6 months.

## 2016-07-28 NOTE — Progress Notes (Signed)
NEUROLOGY FOLLOW UP OFFICE NOTE  TYNEESHA MORRISON [id number]  HISTORY OF PRESENT ILLNESS: [name] is a 45 year old right-handed female with chemotherapy-induced diabetes, morbid obesity, hypertension, hyperlipidemia, cardiomyopathy due to chemotherapy, and history of uterine cancer, who follows up for neuropathy.   UPDATE: Last visit, she was started on gabapentin 300mg  three times daily for pain.  She still reports pain and numbness.  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   HISTORY: She underwent 6 cycles of EMACO for uterine cancer in 2014.  During that time, she developed neuropathy in the fingers and feet.  She describes decreased sensation in the toes, fingers and ball of her feet.  She also reports burning and itching sensation.  Her toes feel cold.  It is constant.  After she finished chemotherapy, the symptoms persisted, although they have not gotten worse.  She reports persistent falls, however.  It usually occurs at night, when she walks in the dark from bed to the bathroom.  However, she also had falls walking down the stairs.  She sometimes uses a cane.  She denies headache or neck pain.  She has localized low back pain but no radicular pain down the legs.  She reports pain in her knees, particularly on the right.  She denies dizziness.  Due to chemotherapy, she also developed diabetes.  A recent Hgb A1c of 6.5%.  X-ray of lumbar spine showed mild multilevel degenerative changes.  Neuropathy labs were performed and were unremarkable.  ANA was negative, Sed Rate 32, B12 437, B6 6.9.  SPEP showed nonspecific findings of possible faint restricted band in the gamma region.  IFE showed slightly restricted mobility in the IgG and Kappa lanes but no monoclonal gammopathy.  PAST MEDICAL HISTORY: Past Medical History:  Diagnosis Date  . Cancer ([place])    uterine  . Cardiomyopathy due to chemotherapy ([place])   . Diabetes mellitus   . Gestational  trophoblastic disease   . Hypertension    during pregnancy  . Molar pregnancy   . Morbid obesity ([place])     MEDICATIONS: Current Outpatient Prescriptions on File Prior to Visit  Medication Sig Dispense Refill  . amLODipine (NORVASC) 5 MG tablet Take 5 mg by mouth daily.    . carvedilol (COREG) 6.25 MG tablet Take 6.25 mg by mouth 2 (two) times daily with a meal.    . ferrous sulfate 325 (65 FE) MG tablet Take 325 mg by mouth every other day.     Marland Kitchen glucose blood (ONETOUCH VERIO) test strip Check Blood sugar twice daily Dx: E11.9 100 each 6  . Lancets (ONETOUCH ULTRASOFT) lancets Use as instructed 100 each 12  . metFORMIN (GLUCOPHAGE-XR) 500 MG 24 hr tablet Take 1 tablet (500 mg total) by mouth 2 (two) times daily. 180 tablet 1  . ONETOUCH DELICA LANCETS MISC 1 application by Does not apply route 2 (two) times daily. 100 each 1  . simvastatin (ZOCOR) 10 MG tablet Take 10 mg by mouth daily.    . valsartan (DIOVAN) 320 MG tablet Take 320 mg by mouth daily.     No current facility-administered medications on file prior to visit.     ALLERGIES: Allergies  Allergen Reactions  . Sulfonamide Derivatives     REACTION: unspecified  . Sulfur Hives    FAMILY HISTORY: Family History  Problem Relation Age of Onset  . Hypertension Mother   . Diabetes Paternal Grandfather   . Hypertension Paternal Grandfather   .  Stroke      aunts and uncles on mother side  . Obesity    . Hypertension Father   . Hypertension Paternal Grandmother     SOCIAL HISTORY: Social History   Social History  . Marital status: Legally Separated    Spouse name: N/A  . Number of children: 1  . Years of education: N/A   Occupational History  . music [employer] History Main Topics  . Smoking status: Never Smoker  . Smokeless tobacco: Never Used  . Alcohol use No  . Drug use: No  . Sexual activity: Yes   Other Topics Concern  . Not on file   Social History Narrative   Pt has  masters degree    [name]: Constitutional: No fevers, chills, or sweats, no generalized fatigue, change in appetite Eyes: No visual changes, double vision, eye pain Ear, nose and throat: No hearing loss, ear pain, nasal congestion, sore throat Cardiovascular: No chest pain, palpitations Respiratory:  No shortness of breath at rest or with exertion, wheezes GastrointestinaI: No nausea, vomiting, diarrhea, abdominal pain, fecal incontinence Genitourinary:  No dysuria, urinary retention or frequency Musculoskeletal:  No neck pain, back pain Integumentary: No rash, pruritus, skin lesions Neurological: as above Psychiatric: No depression, insomnia, anxiety Endocrine: No palpitations, fatigue, diaphoresis, mood swings, change in appetite, change in weight, increased thirst Hematologic/Lymphatic:  No purpura, petechiae. Allergic/Immunologic: no itchy/runny eyes, nasal congestion, recent allergic reactions, rashes  PHYSICAL EXAM: Vitals:   07/28/16 1134  BP: 136/82  Pulse: 86   General: No acute distress.  Patient appears well-groomed.  Morbidly obese body habitus. Head:  Normocephalic/atraumatic Eyes:  Fundi examined but not visualized Neck: supple, no paraspinal tenderness, full range of motion Heart:  Regular rate and rhythm Lungs:  Clear to auscultation bilaterally Back: No paraspinal tenderness Neurological Exam: alert and oriented to person, place, and time. Attention span and concentration intact, recent and remote memory intact, fund of knowledge intact.  Speech fluent and not dysarthric, language intact.  CN II-XII intact. Bulk and tone normal, muscle strength 5/5 throughout.  Sensation to pinprick reduced on bottom/soles of feet, reduced vibration up to ankles.  Deep tendon reflexes absent throughout, toes downgoing.  Finger to nose testing intact.  Wide-based gait. 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 Morbid obesity  PLAN: 1.  Increase gabapentin to 600mg  three times daily 2.  Will check NCV-EMG to assess severity and type of neuropathy (on the off-chance that it may be chemotherapy-induced CIDP) 3. Weight loss 4.  Follow up in 6 months.  25 minutes spent face to face with patient, over 50% spent counseling.  Metta Clines, DO

## 2016-07-28 NOTE — Addendum Note (Signed)
Addended by: Feliberto Gottron on: 07/28/2016 12:14 PM   Modules accepted: Orders

## 2016-07-29 ENCOUNTER — Ambulatory Visit: Payer: Medicare Other

## 2016-07-29 ENCOUNTER — Other Ambulatory Visit: Payer: Self-pay | Admitting: Cardiology

## 2016-07-29 DIAGNOSIS — R55 Syncope and collapse: Secondary | ICD-10-CM

## 2016-07-29 DIAGNOSIS — R51 Headache: Secondary | ICD-10-CM

## 2016-07-29 DIAGNOSIS — G8929 Other chronic pain: Secondary | ICD-10-CM

## 2016-08-04 ENCOUNTER — Telehealth: Payer: Self-pay | Admitting: Neurology

## 2016-08-04 NOTE — Telephone Encounter (Signed)
Spoke with patient. Stated that we are to write her a letter to her employer about her ailment and that she may have to miss work due to inability to walk. Stated it was discussed at [facility] on 07/28/16. Please advise.

## 2016-08-04 NOTE — Telephone Encounter (Signed)
VM-PT left a message saying she needs a work [employer]ar[employer] and wants to stop by and pick it up tomorrow/Dawn CB# [phone]

## 2016-08-04 NOTE — Telephone Encounter (Signed)
We can provide a note stating she has a condition (if she wants [place] to say what it is, then peripheral neuropathy) which causes limitations with prolonged walking and standing.

## 2016-08-04 NOTE — Telephone Encounter (Signed)
Letter written, printed, and placed at front desk for pickup. Attempted to notify pt. VM box full.

## 2016-08-05 ENCOUNTER — Ambulatory Visit
Admission: RE | Admit: 2016-08-05 | Discharge: 2016-08-05 | Disposition: A | Discharge status: Home / Self Care | Payer: Medicare Other | Source: Ambulatory Visit | Attending: Cardiology | Admitting: Cardiology

## 2016-08-05 ENCOUNTER — Ambulatory Visit (INDEPENDENT_AMBULATORY_CARE_PROVIDER_SITE_OTHER): Payer: Medicare Other

## 2016-08-05 DIAGNOSIS — I429 Cardiomyopathy, unspecified: Secondary | ICD-10-CM | POA: Diagnosis not present

## 2016-08-05 DIAGNOSIS — R55 Syncope and collapse: Secondary | ICD-10-CM

## 2016-08-05 DIAGNOSIS — R51 Headache: Secondary | ICD-10-CM

## 2016-08-05 DIAGNOSIS — I427 Cardiomyopathy due to drug and external agent: Secondary | ICD-10-CM | POA: Diagnosis not present

## 2016-08-05 DIAGNOSIS — G8929 Other chronic pain: Secondary | ICD-10-CM

## 2016-08-07 ENCOUNTER — Other Ambulatory Visit: Payer: Self-pay | Admitting: Obstetrics and Gynecology

## 2016-08-07 DIAGNOSIS — R32 Unspecified urinary incontinence: Secondary | ICD-10-CM | POA: Diagnosis not present

## 2016-08-07 DIAGNOSIS — N912 Amenorrhea, unspecified: Secondary | ICD-10-CM | POA: Diagnosis not present

## 2016-08-19 ENCOUNTER — Encounter: Payer: Self-pay | Admitting: Family Medicine

## 2016-08-26 ENCOUNTER — Telehealth: Payer: Self-pay

## 2016-08-26 ENCOUNTER — Ambulatory Visit (INDEPENDENT_AMBULATORY_CARE_PROVIDER_SITE_OTHER): Payer: Medicare Other | Admitting: Neurology

## 2016-08-26 DIAGNOSIS — M6281 Muscle weakness (generalized): Secondary | ICD-10-CM | POA: Diagnosis not present

## 2016-08-26 DIAGNOSIS — R278 Other lack of coordination: Secondary | ICD-10-CM | POA: Diagnosis not present

## 2016-08-26 DIAGNOSIS — G609 Hereditary and idiopathic neuropathy, unspecified: Secondary | ICD-10-CM

## 2016-08-26 DIAGNOSIS — N3946 Mixed incontinence: Secondary | ICD-10-CM | POA: Diagnosis not present

## 2016-08-26 DIAGNOSIS — M62838 Other muscle spasm: Secondary | ICD-10-CM | POA: Diagnosis not present

## 2016-08-26 NOTE — Telephone Encounter (Signed)
Released via mychart

## 2016-08-26 NOTE — Telephone Encounter (Signed)
-----   Message from Pieter Partridge, DO sent at 08/26/2016  2:37 PM EDT ----- Nerve study shows evidence of nonspecific neuropathy.  It does not reveal anything that would change management.

## 2016-08-26 NOTE — Procedures (Signed)
[facility]  [address]  [place], [place] [address] Tel: [phone] Fax:  [phone] Test Date:  08/26/2016  Patient: [name] DOB: 29-Oct-1970 Physician: Narda Amber, DO  Sex: Female Height: 5\' 9"  Ref Phys: Metta Clines, D.O.  ID#: [id number] Temp: 34.1C Technician: Jerilynn Mages. Dean   Patient Complaints: This is a 45 year old female with history of uterine cancer treated with chemotherapy referred for evaluation of bilateral feet paresthesias and gait instability.   NCV & EMG Findings: Extensive electrodiagnostic testing of the right lower extremity and additional studies of the left shows:  1. Bilateral sural and superficial peroneal sensory responses are absent.  2. Bilateral tibial motor responses show reduced amplitude. Bilateral peroneal motor responses are within normal limits.  3. Bilateral tibial H reflex studies are prolonged.  4. Chronic motor axon loss changes are seen affecting the muscles below the knee and conform to a gradient pattern. There is no evidence of accompanied active denervation.   Impression: The electrophysiologic findings are most consistent with a chronic, distal and symmetric sensorimotor polyneuropathy, axon loss in type, affecting the lower extremities.   ___________________________ Narda Amber, DO    Nerve Conduction Studies Anti Sensory Summary Table   Stim Site NR Peak (ms) Norm Peak (ms) P-T Amp (V) Norm P-T Amp  Left Sup Peroneal Anti Sensory (Ant Lat Mall)  34.1C  12 cm NR  <4.5  >5  Right Sup Peroneal Anti Sensory (Ant Lat Mall)  34.1C  12 cm NR  <4.5  >5  Left Sural Anti Sensory (Lat Mall)  34.1C  Calf NR  <4.5  >5  Right Sural Anti Sensory (Lat Mall)  34.1C  Calf NR  <4.5  >5   Motor Summary Table   Stim Site NR Onset (ms) Norm Onset (ms) O-P Amp (mV) Norm O-P Amp Site1 Site2 Delta-0 (ms) Dist (cm) Vel (m/s) Norm Vel (m/s)  Left Peroneal Motor (Ext Dig Brev)  34.1C  Ankle    3.6 <5.5 3.5 >3 B Fib  Ankle 9.0 37.0 41 >40  B Fib    12.6  3.0  Poplt B Fib 2.1 10.0 48 >40  Poplt    14.7  2.9         Right Peroneal Motor (Ext Dig Brev)  34.1C  Ankle    3.7 <5.5 3.7 >3 B Fib Ankle 9.3 37.0 40 >40  B Fib    13.0  2.5  Poplt B Fib 1.8 10.0 56 >40  Poplt    14.8  2.4         Left Tibial Motor (Abd Hall Brev)  34.1C  Ankle    3.3 <6.0 3.1 >8 Knee Ankle 10.2 42.0 41 >40  Knee    13.5  2.0         Right Tibial Motor (Abd Hall Brev)  34.1C  Ankle    4.1 <6.0 4.2 >8 Knee Ankle 10.5 44.0 42 >40  Knee    14.6  3.3          H Reflex Studies   NR H-Lat (ms) Lat Norm (ms) L-R H-Lat (ms) M-Lat (ms) HLat-MLat (ms)  Left Tibial (Gastroc)  34.1C     37.55 <35 0.27 4.63 32.92  Right Tibial (Gastroc)  34.1C     37.28 <35 0.27 4.63 32.65   EMG   Side Muscle Ins Act Fibs Psw Fasc Number Recrt Dur Dur. Amp Amp. Poly Poly. Comment  Right AntTibialis Nml Nml Nml Nml 1- Rapid Some  1+ Some 1+ Nml Nml N/A  Right Gastroc Nml Nml Nml Nml 2- Rapid Some 1+ Some 1+ Nml Nml N/A  Right Flex Dig Long Nml Nml Nml Nml 2- Rapid Some 1+ Some 1+ Nml Nml N/A  Right RectFemoris Nml Nml Nml Nml Nml Nml Nml Nml Nml Nml Nml Nml N/A  Right GluteusMed Nml Nml Nml Nml Nml Nml Nml Nml Nml Nml Nml Nml N/A  Right BicepsFemS Nml Nml Nml Nml Nml Nml Nml Nml Nml Nml Nml Nml N/A  Left AntTibialis Nml Nml Nml Nml 1- Rapid Some 1+ Some 1+ Nml Nml N/A  Left Gastroc Nml Nml Nml Nml 2- Rapid Some 1+ Some 1+ Nml Nml N/A      Waveforms:

## 2016-09-05 DIAGNOSIS — N3946 Mixed incontinence: Secondary | ICD-10-CM | POA: Diagnosis not present

## 2016-09-05 DIAGNOSIS — M62838 Other muscle spasm: Secondary | ICD-10-CM | POA: Diagnosis not present

## 2016-09-05 DIAGNOSIS — N926 Irregular menstruation, unspecified: Secondary | ICD-10-CM | POA: Diagnosis not present

## 2016-09-05 DIAGNOSIS — R278 Other lack of coordination: Secondary | ICD-10-CM | POA: Diagnosis not present

## 2016-09-05 DIAGNOSIS — M6281 Muscle weakness (generalized): Secondary | ICD-10-CM | POA: Diagnosis not present

## 2016-09-16 DIAGNOSIS — G629 Polyneuropathy, unspecified: Secondary | ICD-10-CM | POA: Diagnosis not present

## 2016-09-16 DIAGNOSIS — I429 Cardiomyopathy, unspecified: Secondary | ICD-10-CM | POA: Diagnosis not present

## 2016-09-29 ENCOUNTER — Other Ambulatory Visit: Payer: Self-pay | Admitting: Family Medicine

## 2016-09-29 DIAGNOSIS — Z1231 Encounter for screening mammogram for malignant neoplasm of breast: Secondary | ICD-10-CM

## 2016-10-10 ENCOUNTER — Ambulatory Visit: Payer: Medicare Other | Admitting: Neurology

## 2016-10-14 ENCOUNTER — Telehealth: Payer: Self-pay | Admitting: *Deleted

## 2016-10-14 NOTE — Telephone Encounter (Signed)
Called pt to schedule AWV. She was driving at the time of call and could not schedule, but will call back.

## 2016-12-08 DIAGNOSIS — R0609 Other forms of dyspnea: Secondary | ICD-10-CM | POA: Diagnosis not present

## 2016-12-31 ENCOUNTER — Other Ambulatory Visit: Payer: Self-pay | Admitting: Cardiology

## 2016-12-31 DIAGNOSIS — R079 Chest pain, unspecified: Secondary | ICD-10-CM

## 2017-01-26 ENCOUNTER — Encounter (HOSPITAL_COMMUNITY): Admission: RE | Admit: 2017-01-26 | Payer: Medicare Other | Source: Ambulatory Visit

## 2017-01-27 ENCOUNTER — Encounter (HOSPITAL_COMMUNITY): Payer: Medicare Other | Attending: Cardiology

## 2017-02-06 ENCOUNTER — Ambulatory Visit: Payer: Medicare Other | Admitting: Neurology

## 2017-02-21 DIAGNOSIS — G4733 Obstructive sleep apnea (adult) (pediatric): Secondary | ICD-10-CM | POA: Diagnosis not present

## 2017-03-31 DIAGNOSIS — N3941 Urge incontinence: Secondary | ICD-10-CM | POA: Diagnosis not present

## 2017-03-31 DIAGNOSIS — G4733 Obstructive sleep apnea (adult) (pediatric): Secondary | ICD-10-CM | POA: Diagnosis not present

## 2017-03-31 DIAGNOSIS — E119 Type 2 diabetes mellitus without complications: Secondary | ICD-10-CM | POA: Diagnosis not present

## 2017-03-31 DIAGNOSIS — R2681 Unsteadiness on feet: Secondary | ICD-10-CM | POA: Diagnosis not present

## 2017-03-31 DIAGNOSIS — D649 Anemia, unspecified: Secondary | ICD-10-CM | POA: Diagnosis not present

## 2017-03-31 DIAGNOSIS — Z9181 History of falling: Secondary | ICD-10-CM | POA: Diagnosis not present

## 2017-03-31 DIAGNOSIS — I429 Cardiomyopathy, unspecified: Secondary | ICD-10-CM | POA: Diagnosis not present

## 2017-03-31 DIAGNOSIS — R928 Other abnormal and inconclusive findings on diagnostic imaging of breast: Secondary | ICD-10-CM | POA: Diagnosis not present

## 2017-03-31 DIAGNOSIS — G629 Polyneuropathy, unspecified: Secondary | ICD-10-CM | POA: Diagnosis not present

## 2017-03-31 DIAGNOSIS — I10 Essential (primary) hypertension: Secondary | ICD-10-CM | POA: Diagnosis not present

## 2017-03-31 DIAGNOSIS — H6123 Impacted cerumen, bilateral: Secondary | ICD-10-CM | POA: Diagnosis not present

## 2017-04-02 DIAGNOSIS — E119 Type 2 diabetes mellitus without complications: Secondary | ICD-10-CM | POA: Diagnosis not present

## 2017-04-02 DIAGNOSIS — Z3A00 Weeks of gestation of pregnancy not specified: Secondary | ICD-10-CM | POA: Diagnosis not present

## 2017-04-02 DIAGNOSIS — O019 Hydatidiform mole, unspecified: Secondary | ICD-10-CM | POA: Diagnosis not present

## 2017-04-02 DIAGNOSIS — Z6841 Body Mass Index (BMI) 40.0 and over, adult: Secondary | ICD-10-CM | POA: Diagnosis not present

## 2017-04-02 DIAGNOSIS — R2689 Other abnormalities of gait and mobility: Secondary | ICD-10-CM | POA: Diagnosis not present

## 2017-04-02 DIAGNOSIS — G620 Drug-induced polyneuropathy: Secondary | ICD-10-CM | POA: Diagnosis not present

## 2017-04-02 DIAGNOSIS — T451X5D Adverse effect of antineoplastic and immunosuppressive drugs, subsequent encounter: Secondary | ICD-10-CM | POA: Diagnosis not present

## 2017-04-02 DIAGNOSIS — G629 Polyneuropathy, unspecified: Secondary | ICD-10-CM | POA: Diagnosis not present

## 2017-04-08 DIAGNOSIS — G629 Polyneuropathy, unspecified: Secondary | ICD-10-CM | POA: Diagnosis not present

## 2017-04-08 DIAGNOSIS — R2689 Other abnormalities of gait and mobility: Secondary | ICD-10-CM | POA: Diagnosis not present

## 2017-04-17 DIAGNOSIS — R2689 Other abnormalities of gait and mobility: Secondary | ICD-10-CM | POA: Diagnosis not present

## 2017-04-17 DIAGNOSIS — G629 Polyneuropathy, unspecified: Secondary | ICD-10-CM | POA: Diagnosis not present

## 2017-04-22 DIAGNOSIS — G629 Polyneuropathy, unspecified: Secondary | ICD-10-CM | POA: Diagnosis not present

## 2017-04-22 DIAGNOSIS — R2689 Other abnormalities of gait and mobility: Secondary | ICD-10-CM | POA: Diagnosis not present

## 2017-05-18 DIAGNOSIS — L709 Acne, unspecified: Secondary | ICD-10-CM | POA: Diagnosis not present

## 2017-05-18 DIAGNOSIS — L253 Unspecified contact dermatitis due to other chemical products: Secondary | ICD-10-CM | POA: Diagnosis not present

## 2017-06-24 DIAGNOSIS — R51 Headache: Secondary | ICD-10-CM | POA: Diagnosis not present

## 2017-06-24 DIAGNOSIS — I10 Essential (primary) hypertension: Secondary | ICD-10-CM | POA: Diagnosis not present

## 2021-11-05 ENCOUNTER — Telehealth: Payer: Self-pay | Admitting: *Deleted

## 2021-11-05 NOTE — Telephone Encounter (Signed)
Following up with [name].Per [name], NP we are recommending gyn evaluation with ultrasound to begin with. Patient previously saw Dr. Charlesetta Garibaldi. Advised to contact Dr. Berneta Sages office([phone])  for an appointment or to call the [facility] at [phone]. Patient verbalized understanding.

## 2021-11-05 NOTE — Telephone Encounter (Signed)
Patient called and stated "I was a prior patient there for cancer. I saw Dr Skeet Latch for GTD. I don't have a regular gyn doctor. I have my PCP do my female yearly exams. I started bleeding this past Saturday. I felt wet all the sudden, stood up and the chair was soak. I have no pain or odor. The color is bright red but I have no clots. I was using tampons but soaking them and going through 5 a day. I switched to oversize pads and I'm working through 4 a day. The bleeding is getting better and slowing down a little. I have not had a hysterectomy, but stopped having periods 10 years ago after the cancer treatments. I did go to an Urgent Care in [place] for Sunday. They didn't do an exam or scans. They told me to see a gyn for an exam and [place], so I started with our office." Explained that the doctor was in the OR and the office would call her back later today
